# Patient Record
Sex: Male | Born: 1970 | Race: White | Hispanic: No | Marital: Single | Smoking: Current every day smoker
Health system: Southern US, Community
[De-identification: ages and names within clinical notes are randomized; demographics above are authoritative.]

## PROBLEM LIST (undated history)

## (undated) DIAGNOSIS — F329 Major depressive disorder, single episode, unspecified: Secondary | ICD-10-CM

## (undated) DIAGNOSIS — I219 Acute myocardial infarction, unspecified: Secondary | ICD-10-CM

## (undated) DIAGNOSIS — T7840XA Allergy, unspecified, initial encounter: Secondary | ICD-10-CM

## (undated) DIAGNOSIS — I10 Essential (primary) hypertension: Secondary | ICD-10-CM

## (undated) DIAGNOSIS — F419 Anxiety disorder, unspecified: Secondary | ICD-10-CM

## (undated) DIAGNOSIS — F32A Depression, unspecified: Secondary | ICD-10-CM

## (undated) DIAGNOSIS — K219 Gastro-esophageal reflux disease without esophagitis: Secondary | ICD-10-CM

## (undated) DIAGNOSIS — G4730 Sleep apnea, unspecified: Secondary | ICD-10-CM

## (undated) DIAGNOSIS — E119 Type 2 diabetes mellitus without complications: Secondary | ICD-10-CM

## (undated) DIAGNOSIS — M1990 Unspecified osteoarthritis, unspecified site: Secondary | ICD-10-CM

## (undated) DIAGNOSIS — G43909 Migraine, unspecified, not intractable, without status migrainosus: Secondary | ICD-10-CM

## (undated) DIAGNOSIS — E785 Hyperlipidemia, unspecified: Secondary | ICD-10-CM

## (undated) DIAGNOSIS — I639 Cerebral infarction, unspecified: Secondary | ICD-10-CM

## (undated) HISTORY — DX: Cerebral infarction, unspecified: I63.9

## (undated) HISTORY — DX: Depression, unspecified: F32.A

## (undated) HISTORY — DX: Essential (primary) hypertension: I10

## (undated) HISTORY — DX: Sleep apnea, unspecified: G47.30

## (undated) HISTORY — PX: CORONARY ANGIOPLASTY WITH STENT PLACEMENT: SHX49

## (undated) HISTORY — DX: Acute myocardial infarction, unspecified: I21.9

## (undated) HISTORY — DX: Type 2 diabetes mellitus without complications: E11.9

## (undated) HISTORY — DX: Unspecified osteoarthritis, unspecified site: M19.90

## (undated) HISTORY — DX: Hyperlipidemia, unspecified: E78.5

## (undated) HISTORY — PX: APPENDECTOMY: SHX54

## (undated) HISTORY — DX: Migraine, unspecified, not intractable, without status migrainosus: G43.909

## (undated) HISTORY — DX: Gastro-esophageal reflux disease without esophagitis: K21.9

## (undated) HISTORY — DX: Allergy, unspecified, initial encounter: T78.40XA

## (undated) HISTORY — PX: BACK SURGERY: SHX140

## (undated) HISTORY — DX: Anxiety disorder, unspecified: F41.9

## (undated) HISTORY — DX: Major depressive disorder, single episode, unspecified: F32.9

---

## 2003-03-12 ENCOUNTER — Observation Stay (HOSPITAL_COMMUNITY): Admission: RE | Admit: 2003-03-12 | Discharge: 2003-03-13 | Payer: Self-pay | Admitting: Specialist

## 2003-06-11 ENCOUNTER — Ambulatory Visit (HOSPITAL_COMMUNITY): Admission: RE | Admit: 2003-06-11 | Discharge: 2003-06-12 | Payer: Self-pay | Admitting: Specialist

## 2004-02-12 ENCOUNTER — Emergency Department: Payer: Self-pay | Admitting: Emergency Medicine

## 2004-02-27 ENCOUNTER — Emergency Department: Payer: Self-pay | Admitting: Internal Medicine

## 2006-05-19 ENCOUNTER — Emergency Department: Payer: Self-pay | Admitting: Emergency Medicine

## 2007-07-11 ENCOUNTER — Other Ambulatory Visit: Payer: Self-pay

## 2007-07-11 ENCOUNTER — Emergency Department: Payer: Self-pay | Admitting: Emergency Medicine

## 2007-11-24 ENCOUNTER — Other Ambulatory Visit: Payer: Self-pay

## 2007-11-24 ENCOUNTER — Emergency Department: Payer: Self-pay | Admitting: Emergency Medicine

## 2011-04-21 HISTORY — PX: CORONARY ARTERY BYPASS GRAFT: SHX141

## 2012-02-03 ENCOUNTER — Emergency Department: Payer: Self-pay | Admitting: Emergency Medicine

## 2012-02-03 LAB — CBC
HCT: 51.9 % (ref 40.0–52.0)
HGB: 17.9 g/dL (ref 13.0–18.0)
MCH: 30.1 pg (ref 26.0–34.0)
MCHC: 34.5 g/dL (ref 32.0–36.0)
MCV: 87 fL (ref 80–100)
Platelet: 262 10*3/uL (ref 150–440)
RBC: 5.94 10*6/uL — ABNORMAL HIGH (ref 4.40–5.90)
RDW: 13 % (ref 11.5–14.5)
WBC: 16.7 10*3/uL — ABNORMAL HIGH (ref 3.8–10.6)

## 2012-02-03 LAB — CK TOTAL AND CKMB (NOT AT ARMC)
CK, Total: 178 U/L (ref 35–232)
CK-MB: 0.8 ng/mL (ref 0.5–3.6)

## 2012-02-03 LAB — COMPREHENSIVE METABOLIC PANEL
Albumin: 4 g/dL (ref 3.4–5.0)
Alkaline Phosphatase: 86 U/L (ref 50–136)
Anion Gap: 12 (ref 7–16)
BUN: 9 mg/dL (ref 7–18)
Bilirubin,Total: 0.2 mg/dL (ref 0.2–1.0)
Calcium, Total: 9.2 mg/dL (ref 8.5–10.1)
Chloride: 106 mmol/L (ref 98–107)
Co2: 22 mmol/L (ref 21–32)
Creatinine: 1.2 mg/dL (ref 0.60–1.30)
EGFR (African American): >60
EGFR (Non-African Amer.): >60
Glucose: 151 mg/dL — ABNORMAL HIGH (ref 65–99)
Osmolality: 281 (ref 275–301)
Potassium: 3.5 mmol/L (ref 3.5–5.1)
SGOT(AST): 35 U/L (ref 15–37)
SGPT (ALT): 35 U/L (ref 12–78)
Sodium: 140 mmol/L (ref 136–145)
Total Protein: 7.6 g/dL (ref 6.4–8.2)

## 2012-02-03 LAB — TROPONIN I: Troponin-I: <0.02 ng/mL

## 2012-02-05 NOTE — Discharge Summary (Signed)
 [facility] [address], [place] , [place] [address]   [phone]  Discharge Summary  Calvin Ortiz  [id number]  [birth year]   Admission Date: 02/03/2012  Discharge Date: 02/05/2012  Primary Diagnosis:  STEMI (ST elevation myocardial infarction)  Secondary Diagnoses: Patient Active Problem List   Diagnosis Date Noted  . STEMI (ST elevation myocardial infarction) 02/03/2012  . Hypertension 02/03/2012   Hospital Course:  [name] is a 41 y.o. male with a past medical history of back pain secondary to 2 ruptured herniated discs who presented with CP to OSH. The patient was laying in bed watching a horror movie around 2am when experienced left arm pain followed by sharp left CP. The patient then experienced SOB, diaphoresis, and nausea. Upon receiving nitro, the patient's CP was relieved. He denies any CP like this before though did have brief palpitations while climbing stairs to take his dogs out last week which spontaneously resolved. He denies leg edema, orthopnea, PND, DOE, syncope or lightheadedness. On arrival at the OSH, the patient was noted to have SBP in 200s, HR in 120s and ST elevations in V2-V4 and ST depression in 1 & V1. The patient received IV metoprolol and was started on heparin , 324 ASA, and 600 plavix  prior to transport.  He subsequently received 3 BMS to the RCA in the cath lab on arrival. The patient currently smokes 1ppd for 16 years. Denies any personal or family history of CAD though both parents have HTN. Unemployed and pays for medications out of pocket. He did well post procedure.  Was up walking without difficulty or reproducing symptoms.  He was therefore cleared for discharge home.  Discharged to home with medication changes and follow up as noted below.  As he has no insurance was placed medications from [employer] list to try to help ensure compliance, really stressed the importance of compliance with his aspirin  and plavix .   He will follow with  [facility] as he has no current cardiologist near his current address.  Procedures during hospitalization:  Cardiac catheterization Via 6 French right femoral arterial access:  Left main coronary artery: Normal.  Left circumflex: Normal branching ramus. Normal OM1, small OM2, small OM3. There are epicardial AV groove collaterals from the distal LCX that supply the PL system.  Left anterior descending: Normal D1, small D2. There is a 20% irregularity in the proximal LAD.  Right coronary artery: Chronic subtotal occlusion at the level of the mid-RCA and extending to the acute angle, spanning the acute marginal branch.  Attention directed to the RCA lesion.  Via 6 French right femoral arterial access, bivalirudin blockade:  6 [place] JR4 guide  0.014" Pilot 50 wire  1.5 x 15 mm Sprinter balloon to 10 atm.  2.0 x 20 mm Sprinter balloon to 12 atm.  2.25 x 8 mm Integrity bare metal stent to 12 atm.  2.5 x 26 mm Integrity bare metal stent to 16 atm.  3.0 x 12 mm Integrity bare metal stent to 18 atm.  3.0 x 12 mm delivery system used to post-dilate the mid-RCA stenosis. Left ventricular pressures: LVEDP ~ 20 mmHg.  Impression: 99.9% mid-RCA  10% residual. No apparent complications. I was present for the entire procedure. Patient to ward in satisfactory condition.  Plan: Aspirin  162 mg PO daily indefinitely; clopidogrel  75 mg PO daily for at least 28 days, preferably 12 months; bivalirudin infusion to run at 0.25 mg/kg/hour for 2 hours. Institute antihypertensive therapy titrated to systolic pressure of 160 mmHg for  next 24 hours; plan sheath removal in 4 hours. Echo NORMAL LEFT VENTRICULAR FUNCTION WITH MODERATE LVH NORMAL RIGHT VENTRICULAR SYSTOLIC FUNCTION NO VALVULAR REGURGITATION NO VALVULAR STENOSIS  Physical Exam at discharge:  General: normal apperance Heent: PERRL.  MMM.   Neck: No carotid bruits.  JVP flat.   Lungs: Clear to auscultation bilaterally, without rales, rhonchi or  wheezing CV: RRR, normal S1, S2 and no murmur, rub, gallop or click Abdomen: nontender, nondistended, normal bowel sounds in all four quadrents Skin: Normal and no rashes Neuro: Alert and oriented.  Moves all extremities well.   Extremities: warm and well-perfused, without clubbing, cyanosis or edema  Pertinent laboratory data:  Labs: Recent Labs     02/03/12  0708  02/04/12  0138  02/05/12  0634  NA  133*  134*  138  K  4.3  4.2  3.8  CL  107  108  108  CO2  21  20*  20*  BUN  8  6*  11  CREATININE  0.9  0.8  0.9  MG  2.1  1.8   --    Recent Labs     02/03/12  0708  02/04/12  0138  02/05/12  0634  WBC  10.9*  14.5*  16.0*  HGB  15.7  16.0  16.6  HCT  0.44  0.44  0.48  PLT  239  200  257   Recent Labs     02/03/12  0651  02/04/12  0138  INR  1.7*  0.9  APTT  107.4*  31.3   Discharge Vital Signs: BP 138/71  Pulse 70  Temp(Src) 38 C (100.4 F) (Oral)  Resp 18  Ht 163 cm (5' 4.17)  Wt 92.4 kg (203 lb 11.3 oz)  BMI 34.78 kg/m2  SpO2 97%  Discharge Plan and Instructions:  Appointments: Future Appointments Date Time Provider Department Center  02/18/2012 10:00 AM Mliss Cleotilde Na, PA [facility] [place]   Discharge Medications:  Current Discharge Medication List    START taking these medications   Details  aspirin  81 MG EC tablet Take 1 tablet (81 mg total) by mouth daily. Qty: 30 tablet, Refills: 11    clopidogrel  (PLAVIX ) 75 mg tablet Take 1 tablet (75 mg total) by mouth daily. Qty: 30 tablet, Refills: 11    hydrALAZINE (APRESOLINE) 50 MG tablet Take 1 tablet (50 mg total) by mouth every 8 (eight) hours. Qty: 90 tablet, Refills: 11    isosorbide  mononitrate (IMDUR ) 120 MG ER tablet Take 1 tablet (120 mg total) by mouth daily. Qty: 30 tablet, Refills: 11    lisinopril  (PRINIVIL ,ZESTRIL ) 40 MG tablet Take 1 tablet (40 mg total) by mouth daily. Qty: 30 tablet, Refills: 11    metoprolol tartrate (LOPRESSOR) 100 MG tablet Take 1 tablet (100  mg total) by mouth 2 (two) times daily. Qty: 60 tablet, Refills: 11    simvastatin (ZOCOR) 40 MG tablet Take 1 tablet (40 mg total) by mouth nightly. Qty: 30 tablet, Refills: 11       02/05/2012 Marney Charlie Pizza, NP  I personally performed or jointly provided the services with an auxiliary provider (NP, PA, etc.) but there was no involvement of a resident or fellow.      Lynwood Donnice Crouch, MD

## 2012-02-07 DIAGNOSIS — I10 Essential (primary) hypertension: Secondary | ICD-10-CM | POA: Insufficient documentation

## 2012-02-17 DIAGNOSIS — E1169 Type 2 diabetes mellitus with other specified complication: Secondary | ICD-10-CM | POA: Insufficient documentation

## 2012-02-18 NOTE — Progress Notes (Signed)
 Calvin Ortiz, Fl[place] 02/18/2012 DOB: 06/30/1970  Age: 41 y.o. Clinic Note - Cardiovascular Calvin Na, PA-C  PRIMARY CARE PROVIDER: NONE PCP None [place] None None  PRIMARY CARDIOLOGIST:  Not yet established  PATIENT PROFILE:  Calvin Ortiz  is a 41 y.o.  male who returns for hospital follow up of  STEMI  PROBLEM LIST:    Problem List Date Reviewed: 2012-03-01       Codes Priority Class Noted - Resolved   Hyperlipidemia LDL goal < 70 272.4   03/01/2012 - Present   Overview   Signed March 01, 2012 11:39 AM by Calvin Cleotilde Na, PA     A. 01/2012: TC 174, TG 132, HDL 31, LDL 117    Tobacco use 305.1   03-01-2012 - Present   Overview   Signed 02/18/2012 11:50 AM by Calvin Cleotilde Na, PA     1ppd for 16 years    Hypertension 401.9   02/07/2012 - Present   CAD S/P percutaneous coronary angioplasty 414.01, V45.82   02/03/2012 - Present   Overview   Addendum 03/01/2012 11:38 AM by Calvin Cleotilde Na, PA     A. 01/2012 STEMI. subtoltal occlusion RCA s/p BMS x3. LAD 20%. Normal LCX.  B. 01/2012 echo. EF >55%. Mod LVH.        MEDICATIONS: Current Outpatient Prescriptions  Medication Sig Dispense Refill  . aspirin  81 MG EC tablet Take 1 tablet (81 mg total) by mouth daily.  30 tablet  11  . clopidogrel  (PLAVIX ) 75 mg tablet Take 1 tablet (75 mg total) by mouth daily.  30 tablet  11  . hydrALAZINE (APRESOLINE) 50 MG tablet Take 1 tablet (50 mg total) by mouth every 8 (eight) hours.  90 tablet  11  . isosorbide  mononitrate (IMDUR ) 120 MG ER tablet Take 1 tablet (120 mg total) by mouth daily.  30 tablet  11  . lisinopril  (PRINIVIL ,ZESTRIL ) 40 MG tablet Take 1 tablet (40 mg total) by mouth daily.  30 tablet  11  . simvastatin (ZOCOR) 40 MG tablet Take 1 tablet (40 mg total) by mouth nightly.  30 tablet  11  . DISCONTD: metoprolol tartrate (LOPRESSOR) 100 MG tablet Take 1 tablet (100 mg total) by mouth 2 (two) times daily.  60 tablet  11  . carvedilol  (COREG ) 25 MG tablet  Take 1 tablet (25 mg total) by mouth 2 (two) times daily with meals.  60 tablet  11   No current facility-administered medications for this visit.    ALLERGIES: Allergies  Allergen Reactions  . Morphine Unknown  . Sulfa (Sulfonamide Antibiotics) Rash    SOCIAL HISTORY:  reports that he quit smoking today. He has never used smokeless tobacco. He reports that he does not drink alcohol or use illicit drugs. unemployed d/t back surgeries and inability to work. No health insurance.  FAMILY HISTORY: family history includes Hypertension in his father and mother.  Subjective:  Calvin Ortiz  is a 41 y.o.  male with h/o tobacco use and back surgeries who recently had STEMI and PCI comes in for hospital f/u.   Hospitalized 10/16-10/18/13 for STEMI.  He presented with CP to OSH. The patient was laying in bed watching a horror movie around 2am when experienced left arm pain followed by sharp left CP, SOB, diaphoresis, and nausea relieved with nitro. To OSH, SBP in 200s, HR in 120s and ST elevations in V2-V4. RCA with subtotal occlusion s/p 3 BMS.  BP high, started on multi drug regimen. EF normal by echo. Unemployed  and pays for medications out of pocket. Interval hx No CP, dizziness, syncope, orthopnea, edema, palpitations. Few seconds of SOB in late afternoon hours.  Taking DUAP therapy every day w/o difficulty. Remained off tobacco since discharge. Getting meds OK. Mild h/a from nitrate. Hard time remembering tid hydralazine but still doing it. BP 110-120s at home. No PCP.    Review of Systems:  General. No fever, chills or recent illness, no change in weight, appetite changes. +fatigue HEENT. No change in vision or hearing, no C/O pain or difficulty swallowing, neck stiffness, voice changes Respiratory. No cough or , sleep apnea, chest tightness, high pitched wheeze or stridor. +shortness of breath Cardiovascular. No chest pain or palpitations, PND, orthopnea  GI. No pain, or change in  urinary or bowel habits.+ dyspepsia GU. No dysuria, frequency or hesitancy, decreased amount of urine Musculoskeletal. No joint pain or injury, problems walking, muscle aching Neurological. No headaches, changes in mental status, no loss of sensation, strength or function, dizziness, lightheadedness, passing out, tremors, weakness Heme No easy bruising or bleeding Integument No color change, rash Psych Not easily agitated, confused, depressed, anxious   Objective:  BP 128/76  Pulse 58  Temp(Src) 36.6 C (97.9 F)  Resp 18  Ht 165.1 cm (5' 5)  Wt 97.705 kg (215 lb 6.4 oz)  BMI 35.84 kg/m2  SpO2 96% General Well appearing; no acute distress.  Skin Warm and dry, without rashes or excessive bruising. HEENT Sclera and conjunctiva clear; normocephalic, atraumatic, face is symmetric at rest.   Neck Supple; no masses; no jugular venous distension or carotid bruits; no significant cervical or supraclavicular lymphadenopathy; no thyromegaly Lungs Respiration unlabored; clear to auscultation bilaterally. Cardio Regular rate and rhythm without murmurs, rubs, or gallops. Normal S1, S2. Abdomen Soft; non-tender; not distended; normal, active bowel sounds; no masses or organomegaly, no HJR or pulsatile aorta. Right groin site w/o hematoma or bruit Musculoskeletal No deformity; appropriate muscle tone; no active joint inflammation Extremities No edema, cyanosis, clubbing or varicosities; peripheral pulses 2+ symmetric Neurologic Alert and oriented; speech intact; face symmetric; moves all extremities well, gait normal  Tests:  Pending   Assessment and Plan:   CAD S/P percutaneous coronary angioplasty and STEMI. RCA revascularized. No residual disease. Compliant with DUAP therapy. On aspirin , plavix , BB, statin, ACE. Will see him in 1 year. Cardiac rehab if he obtains insurance. Suggested new gov't plans.  Hypertension. BP now well controlled on multi drug regimen. Needs low cost options. Feel  regimen could be streamlined but will take multiple appts and he is unemployed and without insurance. Switch metop to carvedilol  25mg  bid when he finishes current supply. Decrease Imdur  to 60mg  daily with hopes of eventually discontinuing. He can call me if BP too high or too low. Goal <130/80 after MI. Then, in future would try to get him off hydral/nitrate combo and try amlodipine  of Bp still requires it. Check potassium after starting max dose ACE. Highly encouraged him to obtain local PCP to help manage HTN which would be lower cost option than numerous visit to cards clinic. Check out needymeds.org and find sliding scale clinic in [place] to help - Potassium  Hyperlipidemia LDL goal < 70. LDL 117, goal is <70 after MI. On simva at theoretical max dose for cost purposes. Repeat next year.  Tobacco use. No cigarette smoking since discharge. Congratulated on efforts and recommended to continue.   Other Orders - carvedilol  (COREG ) 25 MG tablet; Take 1 tablet (25 mg total) by mouth  2 (two) times daily with meals.  Disposition:  RTC in 1 year.  Side effects, risks and benefits of medications were explained. The patient or responsible adult showed the ability to learn, asked appropriate questions. There were no barriers to learning and they verbalized understanding of the treatment plan.  After Visit Summary provided to patient and/or family member.  I personally performed the service.  The service was performed non-incident to a physician and/or without a teaching provider being immediately available.

## 2012-06-23 ENCOUNTER — Emergency Department: Payer: Self-pay | Admitting: Emergency Medicine

## 2013-10-26 LAB — TSH: TSH: 1.47 u[IU]/mL (ref <=5.90)

## 2013-11-16 DIAGNOSIS — E7800 Pure hypercholesterolemia, unspecified: Secondary | ICD-10-CM | POA: Insufficient documentation

## 2014-09-27 ENCOUNTER — Ambulatory Visit: Payer: Self-pay

## 2014-10-18 ENCOUNTER — Ambulatory Visit: Payer: Self-pay | Admitting: Ophthalmology

## 2014-10-18 ENCOUNTER — Other Ambulatory Visit: Payer: Self-pay

## 2014-10-18 LAB — HEPATIC FUNCTION PANEL
ALT: 32 U/L (ref 10–40)
AST: 19 U/L (ref 14–40)
Alkaline Phosphatase: 56 U/L (ref 25–125)
Bilirubin, Direct: 0.07 mg/dL (ref 0.01–0.4)
Bilirubin, Total: 0.3 mg/dL

## 2014-10-18 LAB — BASIC METABOLIC PANEL
BUN: 8 mg/dL (ref 4–21)
Creatinine: 0.8 mg/dL (ref 0.6–1.3)
Glucose: 108 mg/dL
Potassium: 4.7 mmol/L (ref 3.4–5.3)
Sodium: 138 mmol/L (ref 137–147)

## 2014-10-18 LAB — LIPID PANEL
Cholesterol: 143 mg/dL (ref 0–200)
HDL: 28 mg/dL — AB (ref 35–70)
LDL Cholesterol: 73 mg/dL
Triglycerides: 212 mg/dL — AB (ref 40–160)

## 2014-10-18 LAB — CBC AND DIFFERENTIAL
HCT: 45 % (ref 41–53)
Hemoglobin: 15.8 g/dL (ref 13.5–17.5)
Neutrophils Absolute: 5 /uL
Platelets: 235 10*3/uL (ref 150–399)
WBC: 8.9 10^3/mL

## 2014-10-18 LAB — HEMOGLOBIN A1C: Hemoglobin A1C: 5.9

## 2014-10-24 ENCOUNTER — Other Ambulatory Visit: Payer: Self-pay | Admitting: General Practice

## 2014-10-24 ENCOUNTER — Ambulatory Visit
Admission: RE | Admit: 2014-10-24 | Discharge: 2014-10-24 | Disposition: A | Discharge status: Home / Self Care | Payer: Disability Insurance | Source: Ambulatory Visit | Attending: General Practice | Admitting: General Practice

## 2014-10-24 DIAGNOSIS — M4800 Spinal stenosis, site unspecified: Secondary | ICD-10-CM

## 2014-10-24 DIAGNOSIS — I159 Secondary hypertension, unspecified: Secondary | ICD-10-CM

## 2014-10-24 DIAGNOSIS — M545 Low back pain: Secondary | ICD-10-CM | POA: Diagnosis present

## 2014-10-24 DIAGNOSIS — M47816 Spondylosis without myelopathy or radiculopathy, lumbar region: Secondary | ICD-10-CM | POA: Insufficient documentation

## 2014-10-30 ENCOUNTER — Ambulatory Visit: Payer: Self-pay

## 2014-10-30 DIAGNOSIS — I10 Essential (primary) hypertension: Secondary | ICD-10-CM | POA: Insufficient documentation

## 2015-01-31 ENCOUNTER — Ambulatory Visit: Payer: Self-pay

## 2015-03-12 DIAGNOSIS — I2510 Atherosclerotic heart disease of native coronary artery without angina pectoris: Secondary | ICD-10-CM | POA: Insufficient documentation

## 2015-07-04 ENCOUNTER — Telehealth: Payer: Self-pay

## 2015-07-04 NOTE — Telephone Encounter (Signed)
Called mr let him know we are unable to authorize refills due to [facility], let mr know he needs to complete eligibility and then schedule an appointment.

## 2015-07-04 NOTE — Telephone Encounter (Signed)
Received fa from [facility] to refill medications , un able to authorize refills pat has not been seen in over a year. Sent fax back to [facility] denying refills

## 2015-07-17 DIAGNOSIS — E7800 Pure hypercholesterolemia, unspecified: Secondary | ICD-10-CM

## 2015-07-17 DIAGNOSIS — I10 Essential (primary) hypertension: Secondary | ICD-10-CM

## 2015-07-18 ENCOUNTER — Ambulatory Visit: Payer: Self-pay

## 2015-08-13 ENCOUNTER — Other Ambulatory Visit: Payer: Self-pay

## 2015-08-20 ENCOUNTER — Ambulatory Visit: Payer: Self-pay

## 2015-08-27 ENCOUNTER — Telehealth: Payer: Self-pay

## 2015-08-27 ENCOUNTER — Other Ambulatory Visit: Payer: Self-pay

## 2015-08-27 NOTE — Telephone Encounter (Signed)
Tried to return patients phone call to reschedule appointment and the phone was disconnected.

## 2015-09-03 ENCOUNTER — Other Ambulatory Visit: Payer: Self-pay

## 2015-09-05 ENCOUNTER — Other Ambulatory Visit: Payer: Self-pay

## 2015-09-05 DIAGNOSIS — E7800 Pure hypercholesterolemia, unspecified: Secondary | ICD-10-CM

## 2015-09-05 DIAGNOSIS — I10 Essential (primary) hypertension: Secondary | ICD-10-CM

## 2015-09-06 LAB — CBC WITH DIFFERENTIAL/PLATELET
BASOS ABS: 0.1 10*3/uL (ref 0.0–0.2)
BASOS: 1 %
EOS (ABSOLUTE): 0.3 10*3/uL (ref 0.0–0.4)
Eos: 3 %
HEMATOCRIT: 45.9 % (ref 37.5–51.0)
HEMOGLOBIN: 15.8 g/dL (ref 12.6–17.7)
Immature Grans (Abs): 0.1 10*3/uL (ref 0.0–0.1)
Immature Granulocytes: 1 %
LYMPHS ABS: 3.3 10*3/uL — AB (ref 0.7–3.1)
Lymphs: 35 %
MCH: 29.4 pg (ref 26.6–33.0)
MCHC: 34.4 g/dL (ref 31.5–35.7)
MCV: 85 fL (ref 79–97)
MONOCYTES: 5 %
Monocytes Absolute: 0.4 10*3/uL (ref 0.1–0.9)
NEUTROS ABS: 5.2 10*3/uL (ref 1.4–7.0)
Neutrophils: 55 %
Platelets: 276 10*3/uL (ref 150–379)
RBC: 5.38 x10E6/uL (ref 4.14–5.80)
RDW: 13.7 % (ref 12.3–15.4)
WBC: 9.3 10*3/uL (ref 3.4–10.8)

## 2015-09-06 LAB — COMPREHENSIVE METABOLIC PANEL
A/G RATIO: 1.8 (ref 1.2–2.2)
ALBUMIN: 4.2 g/dL (ref 3.5–5.5)
ALT: 28 IU/L (ref 0–44)
AST: 22 IU/L (ref 0–40)
Alkaline Phosphatase: 56 IU/L (ref 39–117)
BILIRUBIN TOTAL: 0.3 mg/dL (ref 0.0–1.2)
BUN / CREAT RATIO: 10 (ref 9–20)
BUN: 11 mg/dL (ref 6–24)
CHLORIDE: 101 mmol/L (ref 96–106)
CO2: 19 mmol/L (ref 18–29)
Calcium: 9.7 mg/dL (ref 8.7–10.2)
Creatinine, Ser: 1.14 mg/dL (ref 0.76–1.27)
GFR calc non Af Amer: 78 mL/min/{1.73_m2} (ref >59)
GFR, EST AFRICAN AMERICAN: 91 mL/min/{1.73_m2} (ref >59)
Globulin, Total: 2.3 g/dL (ref 1.5–4.5)
Glucose: 113 mg/dL — ABNORMAL HIGH (ref 65–99)
POTASSIUM: 4.4 mmol/L (ref 3.5–5.2)
Sodium: 139 mmol/L (ref 134–144)
TOTAL PROTEIN: 6.5 g/dL (ref 6.0–8.5)

## 2015-09-06 LAB — LIPID PANEL
Chol/HDL Ratio: 6.8 ratio units — ABNORMAL HIGH (ref 0.0–5.0)
Cholesterol, Total: 149 mg/dL (ref 100–199)
HDL: 22 mg/dL — ABNORMAL LOW (ref >39)
LDL Calculated: 57 mg/dL (ref 0–99)
Triglycerides: 351 mg/dL — ABNORMAL HIGH (ref 0–149)
VLDL Cholesterol Cal: 70 mg/dL — ABNORMAL HIGH (ref 5–40)

## 2015-09-10 ENCOUNTER — Ambulatory Visit: Payer: Self-pay | Admitting: Nurse Practitioner

## 2015-09-10 VITALS — BP 133/76 | Temp 98.6°F | Wt 235.0 lb

## 2015-09-10 DIAGNOSIS — K089 Disorder of teeth and supporting structures, unspecified: Secondary | ICD-10-CM

## 2015-09-10 DIAGNOSIS — E782 Mixed hyperlipidemia: Secondary | ICD-10-CM

## 2015-09-10 DIAGNOSIS — IMO0001 Reserved for inherently not codable concepts without codable children: Secondary | ICD-10-CM

## 2015-09-10 DIAGNOSIS — I10 Essential (primary) hypertension: Secondary | ICD-10-CM

## 2015-09-10 MED ORDER — FEXOFENADINE HCL 180 MG PO TABS: 180.0000 mg | ORAL_TABLET | Freq: Every day | ORAL | Status: DC

## 2015-09-10 MED ORDER — ATORVASTATIN CALCIUM 10 MG PO TABS: 10.0000 mg | ORAL_TABLET | Freq: Every day | ORAL | Status: DC

## 2015-09-10 NOTE — Progress Notes (Signed)
   Subjective:    Patient ID: Calvin Ortiz, male    DOB: 02/15/1971, 45 y.o.   MRN: [id number]  HPI Has not smoked x 3 months.    Triglycerides now at 315, pt has not been taking lipitor, on fenofibrate at 145 mg per day  States ot eating fried foods, but does eat a lot of butter. In particular eats a lots of potatoes,   Eats sandwich for lunch  For supper, grilled chicken, beef stew, spaghetti, cooks for father and disabled mother  Pain L lateral chest, during night and intermittently during day, describes as stabbing, relieved with elevation of L arm, with occsional shortness of breath, no hsitory of dvt, no unsual swelling    C/o allergic rhinitis  Review of Systems     Objective:   Physical Exam  alert verbally appropriate in NAD, carotid bruits present  Multiple broken teeth, no gross abscesses,   No thyromegaly or adenopathy  No ble edema        Assessment & Plan:   Will refer for dental care, asap, no current abscesses, but extensive gingivitis.    Hyperlipidemia with elevation of triglycerides to 315, will restart lipitor at 10 mg each pm and recheck liver panel in one month  Creatinine at 1.14, will recheck renal panel in 3 months  Will also implement fexofenadine at 180 mg per day for his allergic rhinitis.  Will have pt rtc in three months

## 2015-10-08 ENCOUNTER — Other Ambulatory Visit: Payer: Self-pay

## 2015-10-08 DIAGNOSIS — E785 Hyperlipidemia, unspecified: Secondary | ICD-10-CM

## 2015-10-08 DIAGNOSIS — J302 Other seasonal allergic rhinitis: Secondary | ICD-10-CM

## 2015-10-08 MED ORDER — FEXOFENADINE HCL 180 MG PO TABS: 180.0000 mg | ORAL_TABLET | Freq: Every day | ORAL | Status: DC

## 2015-10-08 MED ORDER — ATORVASTATIN CALCIUM 10 MG PO TABS: 10.0000 mg | ORAL_TABLET | Freq: Every day | ORAL | Status: DC

## 2015-10-15 ENCOUNTER — Ambulatory Visit: Payer: Self-pay

## 2015-10-31 ENCOUNTER — Ambulatory Visit: Payer: Self-pay | Admitting: Ophthalmology

## 2015-11-12 ENCOUNTER — Ambulatory Visit: Payer: Self-pay | Admitting: Nurse Practitioner

## 2015-11-12 VITALS — BP 146/85 | HR 72 | Wt 236.5 lb

## 2015-11-12 DIAGNOSIS — J302 Other seasonal allergic rhinitis: Secondary | ICD-10-CM

## 2015-11-12 DIAGNOSIS — E119 Type 2 diabetes mellitus without complications: Secondary | ICD-10-CM

## 2015-11-12 LAB — GLUCOSE, POCT (MANUAL RESULT ENTRY): POC Glucose: 148 mg/dl — AB (ref 70–99)

## 2015-11-12 MED ORDER — FLUTICASONE PROPIONATE 50 MCG/ACT NA SUSP: 2.0000 | Freq: Every day | NASAL | Status: DC

## 2015-11-12 MED ORDER — AMLODIPINE BESYLATE 10 MG PO TABS: 10.0000 mg | ORAL_TABLET | Freq: Every day | ORAL | 1 refills | Status: DC

## 2015-11-12 NOTE — Progress Notes (Signed)
  Here today, did not have may labs drawn.  States bp has been running a little high, ongoing back pain, s/p two spinal surgeries.    States checks bp three times a week, lowest 132/67, and otherwise in the 140's  Otherwise doing ok, intermittent heart "flutters", but not a new problem, on coreg.    cbg tonight at 146, but ate between 5: 30 and 6p.m.  Minimal use of salt.    Quit smoking 6 months ago  Alert verbally appropriate, in no acute distress  No thyromegaly, no adenopathy, carotid bruit not present.    AP rrr, BBrS clear. No BLE edema  Plan:  Pt is to have his ordered labs drawn asap Will increase amlodipine to 10 mg per day, will check response in one month and review labs.

## 2015-11-12 NOTE — Addendum Note (Signed)
Addended by: Zachery Dauer on: 11/12/2015 08:34 PM   Modules accepted: Orders

## 2015-11-18 ENCOUNTER — Other Ambulatory Visit: Payer: Self-pay

## 2015-11-18 DIAGNOSIS — I10 Essential (primary) hypertension: Secondary | ICD-10-CM

## 2015-11-18 MED ORDER — HYDROCHLOROTHIAZIDE 25 MG PO TABS: 25.0000 mg | ORAL_TABLET | Freq: Every day | ORAL | 2 refills | Status: DC

## 2015-11-21 ENCOUNTER — Other Ambulatory Visit: Payer: Self-pay

## 2015-11-21 DIAGNOSIS — J302 Other seasonal allergic rhinitis: Secondary | ICD-10-CM

## 2015-11-21 DIAGNOSIS — E785 Hyperlipidemia, unspecified: Secondary | ICD-10-CM

## 2015-11-21 DIAGNOSIS — I10 Essential (primary) hypertension: Secondary | ICD-10-CM

## 2015-11-21 MED ORDER — AMLODIPINE BESYLATE 10 MG PO TABS: 10.0000 mg | ORAL_TABLET | Freq: Every day | ORAL | 1 refills | Status: DC

## 2015-11-21 MED ORDER — ATORVASTATIN CALCIUM 10 MG PO TABS: 10.0000 mg | ORAL_TABLET | Freq: Every day | ORAL | 2 refills | Status: DC

## 2015-11-21 MED ORDER — FEXOFENADINE HCL 180 MG PO TABS: 180.0000 mg | ORAL_TABLET | Freq: Every day | ORAL | 2 refills | Status: DC

## 2015-11-21 MED ORDER — HYDROCHLOROTHIAZIDE 25 MG PO TABS: 25.0000 mg | ORAL_TABLET | Freq: Every day | ORAL | 2 refills | Status: DC

## 2015-11-26 ENCOUNTER — Other Ambulatory Visit: Payer: Self-pay

## 2015-11-26 DIAGNOSIS — IMO0001 Reserved for inherently not codable concepts without codable children: Secondary | ICD-10-CM

## 2015-11-26 DIAGNOSIS — E782 Mixed hyperlipidemia: Secondary | ICD-10-CM

## 2015-11-27 LAB — HEPATIC FUNCTION PANEL
ALBUMIN: 4.2 g/dL (ref 3.5–5.5)
ALK PHOS: 61 IU/L (ref 39–117)
ALT: 32 IU/L (ref 0–44)
AST: 21 IU/L (ref 0–40)
BILIRUBIN TOTAL: 0.2 mg/dL (ref 0.0–1.2)
Bilirubin, Direct: 0.11 mg/dL (ref 0.00–0.40)
TOTAL PROTEIN: 6.6 g/dL (ref 6.0–8.5)

## 2015-11-27 LAB — URINALYSIS, ROUTINE W REFLEX MICROSCOPIC
BILIRUBIN UA: NEGATIVE
Glucose, UA: NEGATIVE
Ketones, UA: NEGATIVE
Leukocytes, UA: NEGATIVE
Nitrite, UA: NEGATIVE
PH UA: 5 (ref 5.0–7.5)
PROTEIN UA: NEGATIVE
RBC UA: NEGATIVE
Specific Gravity, UA: 1.027 (ref 1.005–1.030)
UUROB: 0.2 mg/dL (ref 0.2–1.0)

## 2015-12-06 ENCOUNTER — Other Ambulatory Visit: Payer: Self-pay | Admitting: Urology

## 2015-12-17 ENCOUNTER — Ambulatory Visit: Payer: Self-pay

## 2015-12-19 ENCOUNTER — Ambulatory Visit: Payer: Self-pay | Admitting: Nurse Practitioner

## 2015-12-19 VITALS — BP 128/76 | HR 62 | Ht 64.0 in | Wt 233.0 lb

## 2015-12-19 DIAGNOSIS — G8929 Other chronic pain: Secondary | ICD-10-CM

## 2015-12-19 DIAGNOSIS — R7309 Other abnormal glucose: Secondary | ICD-10-CM

## 2015-12-19 DIAGNOSIS — I10 Essential (primary) hypertension: Secondary | ICD-10-CM

## 2015-12-19 DIAGNOSIS — M25551 Pain in right hip: Principal | ICD-10-CM

## 2015-12-19 DIAGNOSIS — E782 Mixed hyperlipidemia: Secondary | ICD-10-CM

## 2015-12-19 DIAGNOSIS — R5383 Other fatigue: Secondary | ICD-10-CM

## 2015-12-19 MED ORDER — FLUTICASONE PROPIONATE 50 MCG/ACT NA SUSP: 2.0000 | Freq: Every day | NASAL | Status: DC

## 2015-12-19 NOTE — Progress Notes (Signed)
HPI:  Stopped smoking 12 days ago.    Lumbar spine surgeries in 2007 and 2008, now with radiating R hip pain.    States hip is worse than lower back and can not sleep on R side  No other verbalized concerns    Exam:   Alert, verbally appropriate, in NAD No thyromegaly or adenopathy, no carotid bruits  AP RRR BBrS clear BLE with max of trace edema, + PT pulses x 2.    Plan:    Hip pain, must consider djd, will obtain a film of the R hip for eval.   Will have labs drawn in 3 months and follow up after labs

## 2015-12-26 ENCOUNTER — Other Ambulatory Visit: Payer: Self-pay

## 2015-12-26 DIAGNOSIS — J309 Allergic rhinitis, unspecified: Secondary | ICD-10-CM

## 2016-01-09 ENCOUNTER — Other Ambulatory Visit: Payer: Self-pay | Admitting: Urology

## 2016-01-09 ENCOUNTER — Telehealth: Payer: Self-pay

## 2016-01-09 DIAGNOSIS — M25551 Pain in right hip: Secondary | ICD-10-CM

## 2016-01-09 NOTE — Telephone Encounter (Signed)
Called pt and discussed that a new referral will be placed tonight for his hip. Explained to pt to wait until next week to make sure the order is signed and ready for him to go. Pt verbalized understanding.

## 2016-01-10 MED ORDER — FLUTICASONE PROPIONATE 50 MCG/ACT NA SUSP: 2.0000 | Freq: Every day | NASAL | 11 refills | Status: DC

## 2016-01-16 ENCOUNTER — Ambulatory Visit
Admission: RE | Admit: 2016-01-16 | Discharge: 2016-01-16 | Disposition: A | Discharge status: Home / Self Care | Payer: Self-pay | Source: Ambulatory Visit | Attending: Urology | Admitting: Urology

## 2016-01-16 ENCOUNTER — Other Ambulatory Visit: Payer: Self-pay | Admitting: Urology

## 2016-01-16 DIAGNOSIS — M25551 Pain in right hip: Secondary | ICD-10-CM

## 2016-02-26 ENCOUNTER — Other Ambulatory Visit: Payer: Self-pay | Admitting: Nurse Practitioner

## 2016-02-26 DIAGNOSIS — I10 Essential (primary) hypertension: Secondary | ICD-10-CM

## 2016-02-26 DIAGNOSIS — J302 Other seasonal allergic rhinitis: Secondary | ICD-10-CM

## 2016-02-27 ENCOUNTER — Other Ambulatory Visit: Payer: Self-pay

## 2016-02-27 DIAGNOSIS — J302 Other seasonal allergic rhinitis: Secondary | ICD-10-CM

## 2016-02-27 MED ORDER — FEXOFENADINE HCL 180 MG PO TABS: 180.0000 mg | ORAL_TABLET | Freq: Every day | ORAL | 3 refills | Status: DC

## 2016-02-27 NOTE — Telephone Encounter (Signed)
Received [facility] from [facility] for refill on fexofenadine hcl 180mg .

## 2016-04-02 ENCOUNTER — Encounter (INDEPENDENT_AMBULATORY_CARE_PROVIDER_SITE_OTHER): Payer: Self-pay

## 2016-04-02 ENCOUNTER — Other Ambulatory Visit: Payer: Self-pay

## 2016-04-02 DIAGNOSIS — R7309 Other abnormal glucose: Secondary | ICD-10-CM

## 2016-04-02 DIAGNOSIS — R5383 Other fatigue: Secondary | ICD-10-CM

## 2016-04-02 DIAGNOSIS — I10 Essential (primary) hypertension: Secondary | ICD-10-CM

## 2016-04-02 DIAGNOSIS — E782 Mixed hyperlipidemia: Secondary | ICD-10-CM

## 2016-04-03 LAB — CBC WITH DIFFERENTIAL/PLATELET
BASOS ABS: 0.1 10*3/uL (ref 0.0–0.2)
BASOS: 1 %
EOS (ABSOLUTE): 0.3 10*3/uL (ref 0.0–0.4)
Eos: 3 %
HEMATOCRIT: 43.6 % (ref 37.5–51.0)
HEMOGLOBIN: 14.9 g/dL (ref 13.0–17.7)
Immature Grans (Abs): 0 10*3/uL (ref 0.0–0.1)
Immature Granulocytes: 0 %
LYMPHS ABS: 3.3 10*3/uL — AB (ref 0.7–3.1)
Lymphs: 32 %
MCH: 29.2 pg (ref 26.6–33.0)
MCHC: 34.2 g/dL (ref 31.5–35.7)
MCV: 86 fL (ref 79–97)
MONOCYTES: 8 %
Monocytes Absolute: 0.8 10*3/uL (ref 0.1–0.9)
NEUTROS ABS: 5.7 10*3/uL (ref 1.4–7.0)
Neutrophils: 56 %
Platelets: 268 10*3/uL (ref 150–379)
RBC: 5.1 x10E6/uL (ref 4.14–5.80)
RDW: 13.3 % (ref 12.3–15.4)
WBC: 10.1 10*3/uL (ref 3.4–10.8)

## 2016-04-03 LAB — COMPREHENSIVE METABOLIC PANEL
A/G RATIO: 2 (ref 1.2–2.2)
ALBUMIN: 4.4 g/dL (ref 3.5–5.5)
ALK PHOS: 48 IU/L (ref 39–117)
ALT: 32 IU/L (ref 0–44)
AST: 27 IU/L (ref 0–40)
BILIRUBIN TOTAL: 0.2 mg/dL (ref 0.0–1.2)
BUN / CREAT RATIO: 12 (ref 9–20)
BUN: 15 mg/dL (ref 6–24)
CHLORIDE: 98 mmol/L (ref 96–106)
CO2: 19 mmol/L (ref 18–29)
Calcium: 9.4 mg/dL (ref 8.7–10.2)
Creatinine, Ser: 1.24 mg/dL (ref 0.76–1.27)
GFR calc non Af Amer: 70 mL/min/{1.73_m2} (ref >59)
GFR, EST AFRICAN AMERICAN: 81 mL/min/{1.73_m2} (ref >59)
GLOBULIN, TOTAL: 2.2 g/dL (ref 1.5–4.5)
Glucose: 149 mg/dL — ABNORMAL HIGH (ref 65–99)
Potassium: 4.1 mmol/L (ref 3.5–5.2)
SODIUM: 137 mmol/L (ref 134–144)
TOTAL PROTEIN: 6.6 g/dL (ref 6.0–8.5)

## 2016-04-03 LAB — LIPID PANEL
CHOLESTEROL TOTAL: 154 mg/dL (ref 100–199)
Chol/HDL Ratio: 6.2 ratio units — ABNORMAL HIGH (ref 0.0–5.0)
HDL: 25 mg/dL — AB (ref >39)
LDL CALC: 58 mg/dL (ref 0–99)
TRIGLYCERIDES: 356 mg/dL — AB (ref 0–149)
VLDL CHOLESTEROL CAL: 71 mg/dL — AB (ref 5–40)

## 2016-04-03 LAB — HEMOGLOBIN A1C
Est. average glucose Bld gHb Est-mCnc: 154 mg/dL
Hgb A1c MFr Bld: 7 % — ABNORMAL HIGH (ref 4.8–5.6)

## 2016-04-03 LAB — TSH: TSH: 1.79 u[IU]/mL (ref 0.450–4.500)

## 2016-04-09 ENCOUNTER — Ambulatory Visit: Payer: Self-pay | Admitting: Adult Health Nurse Practitioner

## 2016-04-09 VITALS — BP 130/78 | HR 69 | Temp 98.0°F | Wt 236.0 lb

## 2016-04-09 DIAGNOSIS — E7849 Other hyperlipidemia: Secondary | ICD-10-CM

## 2016-04-09 DIAGNOSIS — I10 Essential (primary) hypertension: Secondary | ICD-10-CM

## 2016-04-09 DIAGNOSIS — J302 Other seasonal allergic rhinitis: Secondary | ICD-10-CM

## 2016-04-09 DIAGNOSIS — E119 Type 2 diabetes mellitus without complications: Secondary | ICD-10-CM

## 2016-04-09 MED ORDER — HYDROCHLOROTHIAZIDE 25 MG PO TABS: 25.0000 mg | ORAL_TABLET | Freq: Every day | ORAL | 0 refills | Status: DC

## 2016-04-09 MED ORDER — LISINOPRIL 40 MG PO TABS: 40.0000 mg | ORAL_TABLET | Freq: Every day | ORAL | 0 refills | Status: DC

## 2016-04-09 MED ORDER — FENOFIBRATE 145 MG PO TABS: 145.0000 mg | ORAL_TABLET | Freq: Every day | ORAL | 1 refills | Status: DC

## 2016-04-09 MED ORDER — CARVEDILOL 25 MG PO TABS: 25.0000 mg | ORAL_TABLET | Freq: Two times a day (BID) | ORAL | 3 refills | Status: DC

## 2016-04-09 MED ORDER — PANTOPRAZOLE SODIUM 20 MG PO TBEC: 20.0000 mg | DELAYED_RELEASE_TABLET | Freq: Every day | ORAL | 1 refills | Status: DC

## 2016-04-09 MED ORDER — FLUTICASONE PROPIONATE 50 MCG/ACT NA SUSP: 2.0000 | Freq: Every day | NASAL | 11 refills | Status: DC

## 2016-04-09 MED ORDER — METFORMIN HCL 500 MG PO TABS: 500.0000 mg | ORAL_TABLET | Freq: Every day | ORAL | 0 refills | Status: DC

## 2016-04-09 MED ORDER — FEXOFENADINE HCL 180 MG PO TABS: 180.0000 mg | ORAL_TABLET | Freq: Every day | ORAL | 3 refills | Status: DC

## 2016-04-09 MED ORDER — ISOSORBIDE MONONITRATE ER 60 MG PO TB24: 60.0000 mg | ORAL_TABLET | Freq: Two times a day (BID) | ORAL | 0 refills | Status: DC

## 2016-04-09 MED ORDER — ATORVASTATIN CALCIUM 10 MG PO TABS: 10.0000 mg | ORAL_TABLET | Freq: Every day | ORAL | 2 refills | Status: DC

## 2016-04-09 MED ORDER — AMLODIPINE BESYLATE 10 MG PO TABS: 10.0000 mg | ORAL_TABLET | Freq: Every day | ORAL | 1 refills | Status: DC

## 2016-04-09 NOTE — Progress Notes (Signed)
  Patient: Calvin Ortiz Male    DOB: 04/07/1971   45 y.o.   MRN: [id number] Visit Date: 04/09/2016  Today's Provider: Jacelyn Pieah Doles-Johnson, NP   Chief Complaint  Patient presents with  . Hip Pain    follow up to xray   Subjective:    HPI   Here for follow up of an R hip and labwork. Hip xray negative.    LDL-58 Taking atorvastatin.   A1C 7.0.  Not currently on any medications.   Last BP 128/76.  Taking medications as directed.  Reports low salt diet.  Monitoring process foods.   Allergies  Allergen Reactions  . Morphine And Related   . Sulfa Antibiotics    Previous Medications   AMLODIPINE (NORVASC) 10 MG TABLET    Take 1 tablet (10 mg total) by mouth daily.   ATORVASTATIN (LIPITOR) 10 MG TABLET    Take 1 tablet (10 mg total) by mouth daily.   CARVEDILOL (COREG) 25 MG TABLET    Take 25 mg by mouth 2 (two) times daily with a meal.   FENOFIBRATE (TRICOR) 145 MG TABLET    Take 145 mg by mouth daily.   FEXOFENADINE (ALLEGRA) 180 MG TABLET    Take 1 tablet (180 mg total) by mouth daily.   FLUTICASONE (FLONASE) 50 MCG/ACT NASAL SPRAY    Place 2 sprays into both nostrils daily.   HYDROCHLOROTHIAZIDE (HYDRODIURIL) 25 MG TABLET    Take 1 tablet by mouth daily.   ISOSORBIDE MONONITRATE (IMDUR) 60 MG 24 HR TABLET    Take 60 mg by mouth 2 (two) times daily.   LISINOPRIL (PRINIVIL,ZESTRIL) 40 MG TABLET    Take 40 mg by mouth daily.   PANTOPRAZOLE (PROTONIX) 20 MG TABLET    Take 20 mg by mouth daily.    Review of Systems  All other systems reviewed and are negative.   Social History  Substance Use Topics  . Smoking status: Former Smoker    Quit date: 09/18/2013  . Smokeless tobacco: Not on file  . Alcohol use No   Objective:   BP 130/78   Pulse 69   Temp 98 F (36.7 C)   Wt 236 lb (107 kg)   BMI 40.51 kg/m   Physical Exam  Constitutional: He is oriented to person, place, and time. He appears well-developed and well-nourished.  HENT:  Head: Normocephalic and  atraumatic.  Eyes: Pupils are equal, round, and reactive to light.  Neck: Normal range of motion. Neck supple.  Cardiovascular: Normal rate, regular rhythm and normal heart sounds.   Pulmonary/Chest: Effort normal and breath sounds normal.  Abdominal: Soft. Bowel sounds are normal.  Neurological: He is alert and oriented to person, place, and time.  Skin: Skin is warm and dry.  Psychiatric: He has a normal mood and affect.  Vitals reviewed.       Assessment & Plan:        DM:  New diagnosis.  A1C 7.0.  Refer to Endo clinic.  Start Metformin 500mg  daily.  Discussed SE profile.  Encourage changes in diet and exercise. 3-4 x weekly.   HTN: BP borderline-monitor at next OV.  Continue current therapy. Monitor diet.    HLD:  Controlled.  LDL at goal <100.  Continue current therapy.  Discussed dietary factors that affect triglycerides.   FU in 3 months for routine visit.      Jacelyn Pieah Doles-Johnson, NP   [facility]

## 2016-06-09 ENCOUNTER — Ambulatory Visit: Payer: Self-pay

## 2016-07-09 ENCOUNTER — Other Ambulatory Visit: Payer: Self-pay

## 2016-07-09 ENCOUNTER — Ambulatory Visit: Payer: Self-pay

## 2016-07-09 DIAGNOSIS — E7849 Other hyperlipidemia: Secondary | ICD-10-CM

## 2016-07-09 DIAGNOSIS — I10 Essential (primary) hypertension: Secondary | ICD-10-CM

## 2016-07-09 DIAGNOSIS — E119 Type 2 diabetes mellitus without complications: Secondary | ICD-10-CM

## 2016-07-10 LAB — LIPID PANEL
CHOLESTEROL TOTAL: 156 mg/dL (ref 100–199)
Chol/HDL Ratio: 5.4 ratio units — ABNORMAL HIGH (ref 0.0–5.0)
HDL: 29 mg/dL — ABNORMAL LOW (ref >39)
LDL CALC: 81 mg/dL (ref 0–99)
Triglycerides: 229 mg/dL — ABNORMAL HIGH (ref 0–149)
VLDL CHOLESTEROL CAL: 46 mg/dL — AB (ref 5–40)

## 2016-07-10 LAB — COMPREHENSIVE METABOLIC PANEL
A/G RATIO: 1.5 (ref 1.2–2.2)
ALBUMIN: 4.3 g/dL (ref 3.5–5.5)
ALK PHOS: 53 IU/L (ref 39–117)
ALT: 30 IU/L (ref 0–44)
AST: 25 IU/L (ref 0–40)
BUN / CREAT RATIO: 11 (ref 9–20)
BUN: 11 mg/dL (ref 6–24)
Bilirubin Total: 0.2 mg/dL (ref 0.0–1.2)
CALCIUM: 10.1 mg/dL (ref 8.7–10.2)
CO2: 19 mmol/L (ref 18–29)
CREATININE: 1 mg/dL (ref 0.76–1.27)
Chloride: 100 mmol/L (ref 96–106)
GFR calc Af Amer: 105 mL/min/{1.73_m2} (ref >59)
GFR, EST NON AFRICAN AMERICAN: 91 mL/min/{1.73_m2} (ref >59)
GLOBULIN, TOTAL: 2.8 g/dL (ref 1.5–4.5)
Glucose: 116 mg/dL — ABNORMAL HIGH (ref 65–99)
Potassium: 4.4 mmol/L (ref 3.5–5.2)
SODIUM: 140 mmol/L (ref 134–144)
Total Protein: 7.1 g/dL (ref 6.0–8.5)

## 2016-07-10 LAB — HEMOGLOBIN A1C
Est. average glucose Bld gHb Est-mCnc: 151 mg/dL
HEMOGLOBIN A1C: 6.9 % — AB (ref 4.8–5.6)

## 2016-07-16 ENCOUNTER — Ambulatory Visit: Payer: Self-pay

## 2016-07-29 ENCOUNTER — Emergency Department
Admission: EM | Admit: 2016-07-29 | Discharge: 2016-07-29 | Disposition: A | Discharge status: Home / Self Care | Payer: Medicaid Other | Attending: Emergency Medicine | Admitting: Emergency Medicine

## 2016-07-29 DIAGNOSIS — Z87891 Personal history of nicotine dependence: Secondary | ICD-10-CM | POA: Diagnosis not present

## 2016-07-29 DIAGNOSIS — I10 Essential (primary) hypertension: Secondary | ICD-10-CM | POA: Diagnosis not present

## 2016-07-29 DIAGNOSIS — E119 Type 2 diabetes mellitus without complications: Secondary | ICD-10-CM | POA: Diagnosis not present

## 2016-07-29 DIAGNOSIS — K047 Periapical abscess without sinus: Secondary | ICD-10-CM

## 2016-07-29 DIAGNOSIS — R51 Headache: Secondary | ICD-10-CM | POA: Diagnosis present

## 2016-07-29 LAB — CBC
HCT: 46.7 % (ref 40.0–52.0)
Hemoglobin: 16 g/dL (ref 13.0–18.0)
MCH: 29.6 pg (ref 26.0–34.0)
MCHC: 34.3 g/dL (ref 32.0–36.0)
MCV: 86.4 fL (ref 80.0–100.0)
PLATELETS: 287 10*3/uL (ref 150–440)
RBC: 5.4 MIL/uL (ref 4.40–5.90)
RDW: 13.4 % (ref 11.5–14.5)
WBC: 15.5 10*3/uL — AB (ref 3.8–10.6)

## 2016-07-29 LAB — COMPREHENSIVE METABOLIC PANEL
ALT: 25 U/L (ref 17–63)
AST: 26 U/L (ref 15–41)
Albumin: 3.9 g/dL (ref 3.5–5.0)
Alkaline Phosphatase: 40 U/L (ref 38–126)
Anion gap: 9 (ref 5–15)
BILIRUBIN TOTAL: 0.6 mg/dL (ref 0.3–1.2)
BUN: 13 mg/dL (ref 6–20)
CALCIUM: 8.9 mg/dL (ref 8.9–10.3)
CO2: 22 mmol/L (ref 22–32)
CREATININE: 0.98 mg/dL (ref 0.61–1.24)
Chloride: 102 mmol/L (ref 101–111)
Glucose, Bld: 172 mg/dL — ABNORMAL HIGH (ref 65–99)
Potassium: 3.5 mmol/L (ref 3.5–5.1)
Sodium: 133 mmol/L — ABNORMAL LOW (ref 135–145)
TOTAL PROTEIN: 7.3 g/dL (ref 6.5–8.1)

## 2016-07-29 LAB — LIPASE, BLOOD: LIPASE: 30 U/L (ref 11–51)

## 2016-07-29 MED ORDER — NAPROXEN 500 MG PO TABS: 500.0000 mg | ORAL_TABLET | Freq: Two times a day (BID) | ORAL | 0 refills | Status: DC

## 2016-07-29 MED ORDER — OXYCODONE-ACETAMINOPHEN 5-325 MG PO TABS
ORAL_TABLET | ORAL | Status: AC
  Administered 2016-07-29: 1 via ORAL
  Filled 2016-07-29: qty 1

## 2016-07-29 MED ORDER — OXYCODONE-ACETAMINOPHEN 5-325 MG PO TABS
1.0000 | ORAL_TABLET | Freq: Once | ORAL | Status: AC
  Administered 2016-07-29: 1 via ORAL

## 2016-07-29 MED ORDER — CLINDAMYCIN HCL 150 MG PO CAPS: 450.0000 mg | ORAL_CAPSULE | Freq: Three times a day (TID) | ORAL | 0 refills | Status: DC

## 2016-07-29 MED ORDER — OXYCODONE-ACETAMINOPHEN 5-325 MG PO TABS: 1.0000 | ORAL_TABLET | Freq: Four times a day (QID) | ORAL | 0 refills | Status: DC | PRN

## 2016-07-29 NOTE — ED Notes (Signed)
Pt states sinus infection x 3 days, now with frontal HA and L sided facial swelling noted along jaw line. Pt states pain goes from forehead to jaw to ear to neck. Pt is speaking in complete sentences. States since being in ED has thrown up 3 times.

## 2016-07-29 NOTE — ED Triage Notes (Signed)
Pt c/o left facial pain with swelling for the past 2-3 days, states today having N/V.Marland Kitchen

## 2016-07-29 NOTE — ED Provider Notes (Addendum)
[facility] Emergency Department Provider Note  ____________________________________________  Time seen: Approximately 9:58 PM  I have reviewed the triage vital signs and the nursing notes.   HISTORY  Chief Complaint Facial Pain and Emesis    HPI Calvin Ortiz is a 46 y.o. male who complains of left facial pain worsening over the past 3 days. Started with pain on his left jaw..  Has multiple chipped teeth.  No trauma. No fever. No difficulty breathing or swallowing.      Past Medical History:  Diagnosis Date  . GERD (gastroesophageal reflux disease)   . Hypertension   . Migraines   . Myocardial infarction    3 Stents  . Stroke [facility]      Patient Active Problem List   Diagnosis Date Noted  . Other hyperlipidemia 04/09/2016  . Type 2 diabetes mellitus without complication, without long-term current use of insulin (HCC) 04/09/2016  . Hypertension 10/30/2014  . Hypercholesterolemia 11/16/2013     Past Surgical History:  Procedure Laterality Date  . BACK SURGERY  2005 and 2007  . CORONARY ARTERY BYPASS GRAFT  2013     Prior to Admission medications   Medication Sig Start Date End Date Taking? Authorizing Provider  amLODipine (NORVASC) 10 MG tablet Take 1 tablet (10 mg total) by mouth daily. 04/09/16   Teah Doles-Johnson, NP  atorvastatin (LIPITOR) 10 MG tablet Take 1 tablet (10 mg total) by mouth daily. 04/09/16   Teah Doles-Johnson, NP  carvedilol (COREG) 25 MG tablet Take 1 tablet (25 mg total) by mouth 2 (two) times daily with a meal. 04/09/16   Teah Doles-Johnson, NP  clindamycin (CLEOCIN) 150 MG capsule Take 3 capsules (450 mg total) by mouth 3 (three) times daily. 07/29/16   Sharman Cheek, MD  fenofibrate (TRICOR) 145 MG tablet Take 1 tablet (145 mg total) by mouth daily. 04/09/16   Teah Doles-Johnson, NP  fexofenadine (ALLEGRA) 180 MG tablet Take 1 tablet (180 mg total) by mouth daily. 04/09/16 05/09/16  Teah Doles-Johnson, NP   fluticasone (FLONASE) 50 MCG/ACT nasal spray Place 2 sprays into both nostrils daily. 04/09/16   Teah Doles-Johnson, NP  hydrochlorothiazide (HYDRODIURIL) 25 MG tablet Take 1 tablet (25 mg total) by mouth daily. 04/09/16   Teah Doles-Johnson, NP  isosorbide mononitrate (IMDUR) 60 MG 24 hr tablet Take 1 tablet (60 mg total) by mouth 2 (two) times daily. 04/09/16   Teah Doles-Johnson, NP  lisinopril (PRINIVIL,ZESTRIL) 40 MG tablet Take 1 tablet (40 mg total) by mouth daily. 04/09/16   Teah Doles-Johnson, NP  metFORMIN (GLUCOPHAGE) 500 MG tablet Take 1 tablet (500 mg total) by mouth daily with breakfast. 04/09/16   Teah Doles-Johnson, NP  naproxen (NAPROSYN) 500 MG tablet Take 1 tablet (500 mg total) by mouth 2 (two) times daily with a meal. 07/29/16   Sharman Cheek, MD  oxyCODONE-acetaminophen (ROXICET) 5-325 MG tablet Take 1 tablet by mouth every 6 (six) hours as needed for severe pain. 07/29/16   Sharman Cheek, MD  pantoprazole (PROTONIX) 20 MG tablet Take 1 tablet (20 mg total) by mouth daily. 04/09/16   Teah Doles-Johnson, NP     Allergies Morphine and related and Sulfa antibiotics   Family History  Problem Relation Age of Onset  . COPD Mother   . Hypertension Mother   . Hyperlipidemia Mother   . COPD Father   . Diabetes Father   . Heart disease Father   . Diabetes Paternal Grandmother     Social History Social History  Substance Use Topics  . Smoking status: Former Smoker    Quit date: 09/18/2013  . Smokeless tobacco: Never Used  . Alcohol use No    Review of Systems  Constitutional:   No fever or chills.  ENT:   No sore throat. No rhinorrhea. L jaw pain.  Cardiovascular:   No chest pain. Respiratory:   No dyspnea or cough. Gastrointestinal:   Negative for abdominal pain, vomiting and diarrhea.  Genitourinary:   Negative for dysuria or difficulty urinating. Musculoskeletal:   Negative for focal pain or swelling Neurological:   Negative for headaches 10-point ROS  otherwise negative.  ____________________________________________   PHYSICAL EXAM:  VITAL SIGNS: ED Triage Vitals  Enc Vitals Group     BP 07/29/16 1707 (!) 170/92     Pulse Rate 07/29/16 1707 72     Resp --      Temp 07/29/16 1707 98.2 F (36.8 C)     Temp Source 07/29/16 1707 Oral     SpO2 07/29/16 1707 98 %     Weight 07/29/16 1715 235 lb (106.6 kg)     Height 07/29/16 1715  (1.626 m)     Head Circumference --      Peak Flow --      Pain Score 07/29/16 1715 9     Pain Loc --      Pain Edu? --      Excl. in GC? --     Vital signs reviewed, nursing assessments reviewed.   Constitutional:   Alert and oriented. Well appearing and in no distress. Eyes:   No scleral icterus. No conjunctival pallor. PERRL. EOMI.  No nystagmus. ENT   Head:   Normocephalic and atraumatic.   Nose:   No congestion/rhinnorhea. No septal hematoma   Mouth/Throat:   MMM, no pharyngeal erythema. No peritonsillar mass. + swelling and tenderness along Left lower gingiva.  Swelling extends into buccal space without fluctuance or obvious fluid collection.    Neck:   No stridor. No SubQ emphysema. No meningismus. Hematological/Lymphatic/Immunilogical:   No cervical lymphadenopathy. Cardiovascular:   RRR. Symmetric bilateral radial and DP pulses.  No murmurs.  Respiratory:   Normal respiratory effort without tachypnea nor retractions. Breath sounds are clear and equal bilaterally. No wheezes/rales/rhonchi. Gastrointestinal:   Soft and nontender. Non distended. There is no CVA tenderness.  No rebound, rigidity, or guarding. Genitourinary:   deferred Musculoskeletal:   Normal range of motion in all extremities. No joint effusions.  No lower extremity tenderness.  No edema. Neurologic:   Normal speech and language.  CN 2-10 normal. Motor grossly intact. No gross focal neurologic deficits are appreciated.   ____________________________________________    LABS (pertinent  positives/negatives) (all labs ordered are listed, but only abnormal results are displayed) Labs Reviewed  COMPREHENSIVE METABOLIC PANEL - Abnormal; Notable for the following:       Result Value   Sodium 133 (*)    Glucose, Bld 172 (*)    All other components within normal limits  CBC - Abnormal; Notable for the following:    WBC 15.5 (*)    All other components within normal limits  LIPASE, BLOOD   ____________________________________________   EKG   Interpreted by me Sinus rhythm rate 70. Nl axis. 1st degree av block. No acute ischemic changes. ____________________________________________    RADIOLOGY  No results found.  ____________________________________________   PROCEDURES Procedures  ____________________________________________   INITIAL IMPRESSION / ASSESSMENT AND PLAN / ED COURSE  Pertinent labs & imaging results  that were available during my care of the patient were reviewed by me and considered in my medical decision making (see chart for details).  Pt well appearing, nad. No evidence of pta, rpa, or abscess. Marland Kitchen Appears to be gingivitis with some secondary space inflammation due to tooth infection. Pt has appt with dentist in 1 week. Will tx with abx for now.  Has appt with pcp tomorrow as well. Encouraged to keep all appts.          ____________________________________________   FINAL CLINICAL IMPRESSION(S) / ED DIAGNOSES  Final diagnoses:  Dental infection      New Prescriptions   CLINDAMYCIN (CLEOCIN) 150 MG CAPSULE    Take 3 capsules (450 mg total) by mouth 3 (three) times daily.   NAPROXEN (NAPROSYN) 500 MG TABLET    Take 1 tablet (500 mg total) by mouth 2 (two) times daily with a meal.   OXYCODONE-ACETAMINOPHEN (ROXICET) 5-325 MG TABLET    Take 1 tablet by mouth every 6 (six) hours as needed for severe pain.     Portions of this note were generated with dragon dictation software. Dictation errors may occur despite best attempts at  proofreading.    Sharman Cheek, MD 07/29/16 2203    Sharman Cheek, MD 07/29/16 2204

## 2016-07-30 ENCOUNTER — Ambulatory Visit: Payer: Self-pay | Admitting: Urology

## 2016-07-30 VITALS — BP 156/98 | HR 75 | Temp 99.9°F | Ht 64.0 in | Wt 248.6 lb

## 2016-07-30 DIAGNOSIS — J302 Other seasonal allergic rhinitis: Secondary | ICD-10-CM

## 2016-07-30 DIAGNOSIS — B359 Dermatophytosis, unspecified: Secondary | ICD-10-CM

## 2016-07-30 DIAGNOSIS — E7849 Other hyperlipidemia: Secondary | ICD-10-CM

## 2016-07-30 DIAGNOSIS — I10 Essential (primary) hypertension: Secondary | ICD-10-CM

## 2016-07-30 MED ORDER — FENOFIBRATE 145 MG PO TABS: 145.0000 mg | ORAL_TABLET | Freq: Every day | ORAL | 1 refills | Status: DC

## 2016-07-30 MED ORDER — PANTOPRAZOLE SODIUM 20 MG PO TBEC: 20.0000 mg | DELAYED_RELEASE_TABLET | Freq: Every day | ORAL | 1 refills | Status: DC

## 2016-07-30 MED ORDER — HYDROCHLOROTHIAZIDE 25 MG PO TABS: 25.0000 mg | ORAL_TABLET | Freq: Every day | ORAL | 0 refills | Status: DC

## 2016-07-30 MED ORDER — LISINOPRIL 40 MG PO TABS: 40.0000 mg | ORAL_TABLET | Freq: Every day | ORAL | 0 refills | Status: DC

## 2016-07-30 MED ORDER — ATORVASTATIN CALCIUM 10 MG PO TABS: 10.0000 mg | ORAL_TABLET | Freq: Every day | ORAL | 2 refills | Status: DC

## 2016-07-30 MED ORDER — CARVEDILOL 25 MG PO TABS: 25.0000 mg | ORAL_TABLET | Freq: Two times a day (BID) | ORAL | 3 refills | Status: DC

## 2016-07-30 MED ORDER — FEXOFENADINE HCL 180 MG PO TABS: 180.0000 mg | ORAL_TABLET | Freq: Every day | ORAL | 3 refills | Status: DC

## 2016-07-30 MED ORDER — METFORMIN HCL 500 MG PO TABS: 500.0000 mg | ORAL_TABLET | Freq: Every day | ORAL | 0 refills | Status: DC

## 2016-07-30 MED ORDER — AMLODIPINE BESYLATE 10 MG PO TABS: 10.0000 mg | ORAL_TABLET | Freq: Every day | ORAL | 1 refills | Status: DC

## 2016-07-30 MED ORDER — FLUTICASONE PROPIONATE 50 MCG/ACT NA SUSP: 2.0000 | Freq: Every day | NASAL | 11 refills | Status: DC

## 2016-07-30 MED ORDER — TRIAMCINOLONE ACETONIDE 0.025 % EX OINT: 1.0000 "application " | TOPICAL_OINTMENT | Freq: Two times a day (BID) | CUTANEOUS | 0 refills | Status: DC

## 2016-07-30 MED ORDER — ISOSORBIDE MONONITRATE ER 60 MG PO TB24: 60.0000 mg | ORAL_TABLET | Freq: Two times a day (BID) | ORAL | 0 refills | Status: DC

## 2016-07-30 NOTE — Progress Notes (Signed)
  Patient: Calvin Ortiz Male    DOB: 1970/11/16   46 y.o.   MRN: [id number] Visit Date: 07/30/2016  Today's Provider: [facility]   Chief Complaint  Patient presents with  . Follow-up    blood work   Subjective:    HPI 46 yo with tooth abscess who was seen in the ED last evening for left side facial swelling.  He was placed on Clindamycin and given Naproxsyn and Percocet.  He just started his antibiotic this morning.    He is also having new lesions crop up on his legs.  He has several lesions on his lower legs and the top of his feet.  They are not itching or painful.      Allergies  Allergen Reactions  . Morphine And Related   . Sulfa Antibiotics    Previous Medications   CLINDAMYCIN (CLEOCIN) 150 MG CAPSULE    Take 3 capsules (450 mg total) by mouth 3 (three) times daily.   NAPROXEN (NAPROSYN) 500 MG TABLET    Take 1 tablet (500 mg total) by mouth 2 (two) times daily with a meal.   OXYCODONE-ACETAMINOPHEN (ROXICET) 5-325 MG TABLET    Take 1 tablet by mouth every 6 (six) hours as needed for severe pain.    Review of Systems  Social History  Substance Use Topics  . Smoking status: Former Smoker    Quit date: 09/18/2013  . Smokeless tobacco: Never Used  . Alcohol use No   Objective:   BP (!) 156/98   Pulse 75   Temp 99.9 F (37.7 C)   Ht  (1.626 m)   Wt 248 lb 9.6 oz (112.8 kg)   BMI 42.67 kg/m   Physical Exam Constitutional: Well nourished. Alert and oriented, No acute distress. HEENT: [place] AT, moist mucus membranes. Trachea midline, no masses. Cardiovascular: No clubbing, cyanosis, or edema. Respiratory: Normal respiratory effort, no increased work of breathing. GI: Abdomen is soft, non tender, non distended, no abdominal masses. Liver and spleen not palpable.  No hernias appreciated.  Stool sample for occult testing is not indicated.   GU: No CVA tenderness.   Skin: Scattered annular lesions on lower extremities, largest being 7 cm x 6  mm, the rest 5 mm or smaller in diameter, no bruises or suspicious lesions. Lymph: No cervical or inguinal adenopathy. Neurologic: Grossly intact, no focal deficits, moving all 4 extremities. Psychiatric: Normal mood and affect.      Assessment & Plan:     1. Tooth abscess with associated inflammation  - continue the antibiotics  - reviewed red flag signs  2. Annular lesions on LE  - trial of steroid cream  - RTC in two weeks      [facility]   [facility]

## 2016-08-13 ENCOUNTER — Ambulatory Visit: Payer: Self-pay | Admitting: Adult Health Nurse Practitioner

## 2016-08-13 VITALS — BP 126/75 | HR 75 | Temp 98.1°F | Wt 248.1 lb

## 2016-08-13 DIAGNOSIS — E119 Type 2 diabetes mellitus without complications: Secondary | ICD-10-CM

## 2016-08-13 DIAGNOSIS — E7800 Pure hypercholesterolemia, unspecified: Secondary | ICD-10-CM

## 2016-08-13 DIAGNOSIS — I10 Essential (primary) hypertension: Secondary | ICD-10-CM

## 2016-08-13 LAB — GLUCOSE, POCT (MANUAL RESULT ENTRY): POC GLUCOSE: 160 mg/dL — AB (ref 70–99)

## 2016-08-13 NOTE — Progress Notes (Signed)
Patient: Calvin Ortiz Male    DOB: Aug 06, 1970   46 y.o.   MRN: [id number] Visit Date: 08/13/2016  Today's Provider: Jacelyn Pi, NP   Chief Complaint  Patient presents with  . Follow-up   Subjective:    HPI   Seen two weeks ago for annular lesion on LE.  Given steroid cream.  Areas have improved with treatment.     DM:  Taking medications as directed.  A1C 6.9 in March.  Monitoring diet somewhat, walking daily for exercise.  Not checking CBGs at home.   HLD:  Taking medications as directed.  Last LDL 81.   HTN: Taking medications as directed.  Denies any headaches, CP.    Allergies  Allergen Reactions  . Morphine And Related   . Sulfa Antibiotics    Previous Medications   AMLODIPINE (NORVASC) 10 MG TABLET    Take 1 tablet (10 mg total) by mouth daily.   ATORVASTATIN (LIPITOR) 10 MG TABLET    Take 1 tablet (10 mg total) by mouth daily.   CARVEDILOL (COREG) 25 MG TABLET    Take 1 tablet (25 mg total) by mouth 2 (two) times daily with a meal.   CLINDAMYCIN (CLEOCIN) 150 MG CAPSULE    Take 3 capsules (450 mg total) by mouth 3 (three) times daily.   FENOFIBRATE (TRICOR) 145 MG TABLET    Take 1 tablet (145 mg total) by mouth daily.   FEXOFENADINE (ALLEGRA) 180 MG TABLET    Take 1 tablet (180 mg total) by mouth daily.   FLUTICASONE (FLONASE) 50 MCG/ACT NASAL SPRAY    Place 2 sprays into both nostrils daily.   HYDROCHLOROTHIAZIDE (HYDRODIURIL) 25 MG TABLET    Take 1 tablet (25 mg total) by mouth daily.   ISOSORBIDE MONONITRATE (IMDUR) 60 MG 24 HR TABLET    Take 1 tablet (60 mg total) by mouth 2 (two) times daily.   LISINOPRIL (PRINIVIL,ZESTRIL) 40 MG TABLET    Take 1 tablet (40 mg total) by mouth daily.   METFORMIN (GLUCOPHAGE) 500 MG TABLET    Take 1 tablet (500 mg total) by mouth daily with breakfast.   NAPROXEN (NAPROSYN) 500 MG TABLET    Take 1 tablet (500 mg total) by mouth 2 (two) times daily with a meal.   OXYCODONE-ACETAMINOPHEN (ROXICET) 5-325 MG  TABLET    Take 1 tablet by mouth every 6 (six) hours as needed for severe pain.   PANTOPRAZOLE (PROTONIX) 20 MG TABLET    Take 1 tablet (20 mg total) by mouth daily.   TRIAMCINOLONE (KENALOG) 0.025 % OINTMENT    Apply 1 application topically 2 (two) times daily.    Review of Systems  All other systems reviewed and are negative.   Social History  Substance Use Topics  . Smoking status: Former Smoker    Quit date: 09/18/2013  . Smokeless tobacco: Never Used  . Alcohol use No   Objective:   BP 126/75   Pulse 75   Temp 98.1 F (36.7 C)   Wt 248 lb 1.6 oz (112.5 kg)   BMI 42.59 kg/m   Physical Exam  Constitutional: He is oriented to person, place, and time. He appears well-developed and well-nourished.  HENT:  Head: Normocephalic and atraumatic.  Eyes: Pupils are equal, round, and reactive to light.  Neck: Normal range of motion. Neck supple.  Cardiovascular: Normal rate, regular rhythm and normal heart sounds.   Pulmonary/Chest: Effort normal and breath sounds normal.  Abdominal: Soft. Bowel sounds are normal.  Neurological: He is alert and oriented to person, place, and time.  Skin: Skin is warm and dry.  Vitals reviewed.       Assessment & Plan:         DM:  Controlled.  Encourage diabetic diet and exercise.  Continue current medication regimen.   HLD:  Controlled.   Continue current regimen.  Encourage low cholesterol, low fat diet and exercise.   HTN:  Controlled.  Goal BP <140/80.  Continue current medication regimen.  Encourage low salt diet and exercise.   Annular lesions improved.        Jacelyn Pi, NP   [facility]

## 2016-09-22 ENCOUNTER — Telehealth: Payer: Self-pay | Admitting: Pharmacy Technician

## 2016-09-22 NOTE — Telephone Encounter (Signed)
Patient eligible to receive medication assistance at [facility] until 05/21/17 as long as eligibility requirements continue to be met.  Betty J. Kluttz Care Manager [facility] 

## 2016-10-08 ENCOUNTER — Other Ambulatory Visit: Payer: Self-pay

## 2016-10-08 DIAGNOSIS — I10 Essential (primary) hypertension: Secondary | ICD-10-CM

## 2016-10-08 MED ORDER — ISOSORBIDE MONONITRATE ER 60 MG PO TB24: 60.0000 mg | ORAL_TABLET | Freq: Two times a day (BID) | ORAL | 0 refills | Status: DC

## 2016-10-29 ENCOUNTER — Ambulatory Visit: Payer: Self-pay | Admitting: Ophthalmology

## 2016-10-29 ENCOUNTER — Other Ambulatory Visit: Payer: Self-pay | Admitting: Internal Medicine

## 2016-10-29 DIAGNOSIS — I10 Essential (primary) hypertension: Secondary | ICD-10-CM

## 2016-10-29 LAB — HM DIABETES EYE EXAM

## 2016-11-11 ENCOUNTER — Other Ambulatory Visit: Payer: Self-pay | Admitting: Ophthalmology

## 2016-11-23 ENCOUNTER — Other Ambulatory Visit: Payer: Self-pay | Admitting: Internal Medicine

## 2016-12-03 ENCOUNTER — Other Ambulatory Visit: Payer: Self-pay

## 2016-12-10 ENCOUNTER — Ambulatory Visit: Payer: Self-pay

## 2016-12-10 ENCOUNTER — Other Ambulatory Visit: Payer: Self-pay

## 2016-12-10 DIAGNOSIS — I10 Essential (primary) hypertension: Secondary | ICD-10-CM

## 2016-12-10 DIAGNOSIS — E119 Type 2 diabetes mellitus without complications: Secondary | ICD-10-CM

## 2016-12-10 DIAGNOSIS — E7800 Pure hypercholesterolemia, unspecified: Secondary | ICD-10-CM

## 2016-12-11 LAB — COMPREHENSIVE METABOLIC PANEL
A/G RATIO: 1.4 (ref 1.2–2.2)
ALBUMIN: 3.9 g/dL (ref 3.5–5.5)
ALK PHOS: 42 IU/L (ref 39–117)
ALT: 34 IU/L (ref 0–44)
AST: 25 IU/L (ref 0–40)
BUN / CREAT RATIO: 13 (ref 9–20)
BUN: 15 mg/dL (ref 6–24)
CHLORIDE: 102 mmol/L (ref 96–106)
CO2: 21 mmol/L (ref 20–29)
Calcium: 9.4 mg/dL (ref 8.7–10.2)
Creatinine, Ser: 1.17 mg/dL (ref 0.76–1.27)
GFR calc non Af Amer: 75 mL/min/{1.73_m2} (ref >59)
GFR, EST AFRICAN AMERICAN: 87 mL/min/{1.73_m2} (ref >59)
GLOBULIN, TOTAL: 2.7 g/dL (ref 1.5–4.5)
Glucose: 159 mg/dL — ABNORMAL HIGH (ref 65–99)
Potassium: 3.8 mmol/L (ref 3.5–5.2)
SODIUM: 138 mmol/L (ref 134–144)
TOTAL PROTEIN: 6.6 g/dL (ref 6.0–8.5)

## 2016-12-11 LAB — HEMOGLOBIN A1C
Est. average glucose Bld gHb Est-mCnc: 146 mg/dL
HEMOGLOBIN A1C: 6.7 % — AB (ref 4.8–5.6)

## 2016-12-11 LAB — LIPID PANEL
CHOL/HDL RATIO: 6.3 ratio — AB (ref 0.0–5.0)
Cholesterol, Total: 139 mg/dL (ref 100–199)
HDL: 22 mg/dL — ABNORMAL LOW (ref >39)
LDL CALC: 55 mg/dL (ref 0–99)
TRIGLYCERIDES: 312 mg/dL — AB (ref 0–149)
VLDL Cholesterol Cal: 62 mg/dL — ABNORMAL HIGH (ref 5–40)

## 2016-12-17 ENCOUNTER — Ambulatory Visit: Payer: Self-pay | Admitting: Adult Health Nurse Practitioner

## 2016-12-17 VITALS — BP 116/68 | HR 64 | Temp 99.3°F | Wt 244.2 lb

## 2016-12-17 DIAGNOSIS — E7800 Pure hypercholesterolemia, unspecified: Secondary | ICD-10-CM

## 2016-12-17 DIAGNOSIS — E119 Type 2 diabetes mellitus without complications: Secondary | ICD-10-CM

## 2016-12-17 DIAGNOSIS — I10 Essential (primary) hypertension: Secondary | ICD-10-CM

## 2016-12-17 LAB — GLUCOSE, POCT (MANUAL RESULT ENTRY): POC GLUCOSE: 111 mg/dL — AB (ref 70–99)

## 2016-12-17 NOTE — Progress Notes (Signed)
Patient: Calvin Ortiz Male    DOB: 02/29/1971   46 y.o.   MRN: [id number] Visit Date: 12/17/2016  Today's Provider: Jacelyn Pieah Doles-Johnson, NP   Chief Complaint  Patient presents with  . Follow-up    Blood work 2 days ago   Subjective:    HPI   DM:  Taking medications as directed.  A1c is 6.7.   HLD:  Taking medications as directed. LFTs WNL.  LDL: 55.   HTN:  Taking medications as directed.       Allergies  Allergen Reactions  . Morphine And Related   . Sulfa Antibiotics Rash   Previous Medications   AMLODIPINE (NORVASC) 10 MG TABLET    Take 1 tablet (10 mg total) by mouth daily.   ATORVASTATIN (LIPITOR) 10 MG TABLET    Take 1 tablet (10 mg total) by mouth daily.   CARVEDILOL (COREG) 25 MG TABLET    Take 1 tablet (25 mg total) by mouth 2 (two) times daily with a meal.   FENOFIBRATE (TRICOR) 145 MG TABLET    Take 1 tablet (145 mg total) by mouth daily.   FEXOFENADINE (ALLEGRA) 180 MG TABLET    Take 1 tablet (180 mg total) by mouth daily.   FLUTICASONE (FLONASE) 50 MCG/ACT NASAL SPRAY    Place 2 sprays into both nostrils daily.   HYDROCHLOROTHIAZIDE (HYDRODIURIL) 25 MG TABLET    TAKE ONE TABLET BY MOUTH EVERY DAY   ISOSORBIDE MONONITRATE (IMDUR) 60 MG 24 HR TABLET    Take 1 tablet (60 mg total) by mouth 2 (two) times daily.   LISINOPRIL (PRINIVIL,ZESTRIL) 40 MG TABLET    TAKE ONE TABLET BY MOUTH EVERY DAY   METFORMIN (GLUCOPHAGE) 500 MG TABLET    TAKE ONE TABLET BY MOUTH EVERY DAY WITH BREAKFAST   PANTOPRAZOLE (PROTONIX) 20 MG TABLET    Take 1 tablet (20 mg total) by mouth daily.   TRIAMCINOLONE (KENALOG) 0.025 % OINTMENT    Apply 1 application topically 2 (two) times daily.    Review of Systems  All other systems reviewed and are negative.   Social History  Substance Use Topics  . Smoking status: Former Smoker    Quit date: 09/18/2013  . Smokeless tobacco: Never Used  . Alcohol use No   Objective:   BP 116/68   Pulse 64   Temp 99.3 F (37.4 C)   Wt  244 lb 3.2 oz (110.8 kg)   BMI 41.92 kg/m   Physical Exam  Constitutional: He is oriented to person, place, and time. He appears well-developed and well-nourished.  HENT:  Head: Normocephalic and atraumatic.  Neck: Normal range of motion. Neck supple.  Cardiovascular: Normal rate, regular rhythm, normal heart sounds and intact distal pulses.   Pulmonary/Chest: Effort normal and breath sounds normal.  Abdominal: Soft. Bowel sounds are normal.  Neurological: He is alert and oriented to person, place, and time.  Skin: Skin is warm and dry.  Vitals reviewed.       Assessment & Plan:         DM:  Controlled.  Encourage diabetic diet and exercise.  Continue current medication regimen.   HLD:  Controlled.   Continue current regimen.  Encourage low cholesterol, low fat diet and exercise.   HTN:  Controlled.  Goal BP <140/90.  Continue current medication regimen.  Encourage low salt diet and exercise.          Jacelyn Pieah Doles-Johnson, NP   [facility]

## 2017-01-06 ENCOUNTER — Ambulatory Visit: Payer: Self-pay | Admitting: Specialist

## 2017-01-13 ENCOUNTER — Ambulatory Visit: Payer: Self-pay | Admitting: Specialist

## 2017-01-15 ENCOUNTER — Other Ambulatory Visit: Payer: Self-pay | Admitting: Urology

## 2017-01-15 DIAGNOSIS — J302 Other seasonal allergic rhinitis: Secondary | ICD-10-CM

## 2017-03-18 ENCOUNTER — Ambulatory Visit: Payer: Medicaid Other

## 2017-03-18 DIAGNOSIS — Z23 Encounter for immunization: Secondary | ICD-10-CM

## 2017-04-14 ENCOUNTER — Telehealth: Payer: Self-pay | Admitting: Pharmacy Technician

## 2017-04-14 NOTE — Telephone Encounter (Signed)
Pt has pharmacy coverage through Medicaid.  [facility] unable to provide medication assistance.  Sherilyn DacostaBetty J. Jariah Tarkowski Care Manager [facility]

## 2017-04-22 ENCOUNTER — Other Ambulatory Visit: Payer: Medicaid Other

## 2017-05-21 ENCOUNTER — Other Ambulatory Visit: Payer: Self-pay

## 2017-05-21 ENCOUNTER — Encounter: Payer: Self-pay | Admitting: Nurse Practitioner

## 2017-05-21 ENCOUNTER — Ambulatory Visit: Payer: Medicaid Other | Admitting: Nurse Practitioner

## 2017-05-21 VITALS — BP 100/51 | HR 59 | Temp 98.0°F | Ht 64.0 in | Wt 240.2 lb

## 2017-05-21 DIAGNOSIS — F331 Major depressive disorder, recurrent, moderate: Secondary | ICD-10-CM

## 2017-05-21 DIAGNOSIS — Z7689 Persons encountering health services in other specified circumstances: Secondary | ICD-10-CM | POA: Diagnosis not present

## 2017-05-21 DIAGNOSIS — F4010 Social phobia, unspecified: Secondary | ICD-10-CM

## 2017-05-21 MED ORDER — FLUOXETINE HCL 20 MG PO CAPS: 20.0000 mg | ORAL_CAPSULE | Freq: Every day | ORAL | 2 refills | Status: DC

## 2017-05-21 NOTE — Progress Notes (Signed)
Subjective:    Patient ID: Calvin Ortiz, male    DOB: 06/25/1970, 47 y.o.   MRN: [id number]  Calvin Ortiz is a 47 y.o. male presenting on 05/21/2017 for Establish Care (diabetes)   HPI Establish Care New Provider Pt last seen by PCP [facility] and now has medicaid.  Obtain records.  Pt wants to continue care for chronic diseases: diabetes, GERD, HTN, hyperlipidemia, CAD, sleep apnea.  Today is concerned for anxiety depression as he has had a visit for chronic diseases within the last 6 months and still has medications.  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  Racing thoughts prevent relaxation - constant to-do list.  Irritability is more personality trait, but traffic is a trigger currently and this is better with less stress. - feels very down, anhedonia, lots of tearfulness - notes he has had trouble with dealing with his father's death - Also experiences swings in appetite - Pt is still processing his grief from his father's death. Largely notes he has avoided his feelings.  Most affects him when he is in public or going back home from mom/dad's house  Stays strong when with his mother are most difficult times for overwhelming grief. - Social anxiety and in large crowds does not do well. When he is in an uncomfortable situation he notes sweating and shaking.  - Has been on fluoxetine and Zoloft before age [age] and has not had any pharmacological treatment since that time.  Was mostly able to manage symptoms until the last year.   Depression screen PHQ 2/9 05/21/2017  Decreased Interest 2  Down, Depressed, Hopeless 2  PHQ - 2 Score 4  Altered sleeping 2  Tired, decreased energy 3  Change in appetite 3  Feeling bad or failure about yourself  0  Trouble concentrating 2  Moving slowly or fidgety/restless 0  Suicidal thoughts 0  PHQ-9 Score  14  Difficult doing work/chores Somewhat difficult   GAD 7 : Generalized Anxiety Score 05/21/2017  Nervous, Anxious, on Edge 3  Control/stop worrying 0  Worry too much - different things 0  Trouble relaxing 1  Restless 0  Easily annoyed or irritable 3  Afraid - awful might happen 0  Total GAD 7 Score 7  Anxiety Difficulty Somewhat difficult    Past Medical History:  Diagnosis Date  . Anxiety   . Depression   . Diabetes mellitus without complication (HCC)   . GERD (gastroesophageal reflux disease)   . Hyperlipidemia   . Hypertension   . Migraines   . Myocardial infarction (HCC)    3 Stents  . Sleep apnea   . Stroke [facility](HCC)    Past Surgical History:  Procedure Laterality Date  . BACK SURGERY  2005 and 2007  . CORONARY ANGIOPLASTY WITH STENT PLACEMENT    . CORONARY ARTERY BYPASS GRAFT  2013   Social History   Socioeconomic History  . Marital status: Single    Spouse name: Not on file  . Number of children: Not on file  . Years of education: Not on file  . Highest education level: Not on file  Social Needs  . Financial resource strain: Not on file  . Food insecurity - worry: Not on file  . Food insecurity - inability: Not on file  . Transportation needs - medical: Not on file  . Transportation needs -  non-medical: Not on file  Occupational History  . Not on file  Tobacco Use  . Smoking status: Current Every Day Smoker    Packs/day: 0.50    Years: 25.00    Pack years: 12.50    Types: Cigarettes  . Smokeless tobacco: Never Used  . Tobacco comment: Was quit for about 14 months before resuming (has started/stopped total of 5 times)  Substance and Sexual Activity  . Alcohol use: No  . Drug use: No  . Sexual activity: Not Currently  Other Topics Concern  . Not on file  Social History Narrative  . Not on file   Family History  Problem Relation Age of Onset  . COPD Mother   . Hypertension Mother   . Hyperlipidemia Mother   . Depression Mother   . Heart  disease Mother   . COPD Father   . Diabetes Father   . Heart disease Father   . Stroke Father   . Cancer Father        pelvic mass  . Lung cancer Father   . Depression Father   . Diabetes Paternal Grandmother    Current Outpatient Medications on File Prior to Visit  Medication Sig  . amLODipine (NORVASC) 10 MG tablet Take 1 tablet (10 mg total) by mouth daily.  Marland Kitchen aspirin EC 81 MG tablet Take 81 mg by mouth daily.  Marland Kitchen atorvastatin (LIPITOR) 10 MG tablet Take 1 tablet (10 mg total) by mouth daily.  . carvedilol (COREG) 25 MG tablet Take 1 tablet (25 mg total) by mouth 2 (two) times daily with a meal.  . fenofibrate (TRICOR) 145 MG tablet Take 1 tablet (145 mg total) by mouth daily.  . fluticasone (FLONASE) 50 MCG/ACT nasal spray Place 2 sprays into both nostrils daily.  . hydrochlorothiazide (HYDRODIURIL) 25 MG tablet TAKE ONE TABLET BY MOUTH EVERY DAY  . isosorbide mononitrate (IMDUR) 60 MG 24 hr tablet Take 1 tablet (60 mg total) by mouth 2 (two) times daily. (Patient taking differently: Take 60 mg by mouth daily. )  . lisinopril (PRINIVIL,ZESTRIL) 40 MG tablet TAKE ONE TABLET BY MOUTH EVERY DAY  . metFORMIN (GLUCOPHAGE) 500 MG tablet TAKE ONE TABLET BY MOUTH EVERY DAY WITH BREAKFAST  . pantoprazole (PROTONIX) 20 MG tablet Take 1 tablet (20 mg total) by mouth daily.  Marland Kitchen triamcinolone (KENALOG) 0.025 % ointment Apply 1 application topically 2 (two) times daily. (Patient not taking: Reported on 05/21/2017)   Current Facility-Administered Medications on File Prior to Visit  Medication  . fluticasone (FLONASE) 50 MCG/ACT nasal spray 2 spray    Review of Systems  Constitutional: Negative.   HENT: Negative.   Eyes: Negative.   Respiratory: Negative.   Cardiovascular: Negative.   Gastrointestinal: Negative.   Endocrine: Negative.   Genitourinary: Negative.   Musculoskeletal: Positive for arthralgias.  Skin: Negative.   Allergic/Immunologic: Negative.   Neurological: Negative.     Hematological: Negative.   Psychiatric/Behavioral: Positive for agitation, dysphoric mood and sleep disturbance. Negative for behavioral problems, confusion, decreased concentration, hallucinations, self-injury and suicidal ideas. The patient is nervous/anxious. The patient is not hyperactive.    Per HPI unless specifically indicated above     Objective:    BP (!) 100/51 (BP Location: Right Arm, Patient Position: Sitting, Cuff Size: Large)   Pulse (!) 59   Temp 98 F (36.7 C) (Oral)   Ht 5\' 4"  (1.626 m)   Wt 240 lb 3.2 oz (109 kg)   SpO2 98%   BMI 41.23  kg/m   Wt Readings from Last 3 Encounters:  05/21/17 240 lb 3.2 oz (109 kg)  12/17/16 244 lb 3.2 oz (110.8 kg)  08/13/16 248 lb 1.6 oz (112.5 kg)    Physical Exam  General - morbidly obese, well-appearing, NAD HEENT - Normocephalic, atraumatic Neck - supple, non-tender, no LAD, no thyromegaly, no carotid bruit Heart - RRR, no murmurs heard Lungs - Clear throughout all lobes, no wheezing, crackles, or rhonchi. Normal work of breathing. Extremeties - non-tender, no edema, cap refill < 2 seconds, peripheral pulses intact +2 bilaterally Skin - warm, dry Neuro - awake, alert, oriented x3, normal gait Psych - Depressed mood and affect, normal behavior, participates well in conversation and is appropriate.  Results for orders placed or performed in visit on 12/17/16  POCT Glucose (CBG)  Result Value Ref Range   POC Glucose 111 (A) 70 - 99 mg/dl      Assessment & Plan:   Problem List Items Addressed This Visit    None    Visit Diagnoses    Moderate episode of recurrent major depressive disorder (HCC)    -  Primary   Relevant Medications   FLUoxetine (PROZAC) 20 MG capsule   Other Relevant Orders   Ambulatory referral to Social Work   Social anxiety disorder       Relevant Medications   FLUoxetine (PROZAC) 20 MG capsule   Other Relevant Orders   Ambulatory referral to Social Work  Pt with uncontrolled anxiety and  depression.  Negatively influenced by inappropriate grief response.  Death > 1 year ago and has not processed feelings of loss.  Has positive PHQ-9 and GAD 7 scores to indicate severe anxiety and depression.  Anxiety is greater in social situations.   Plan: 1. Pt will benefit from counseling for grief and CBT for anxiety/depression.  Referral placed to Sharol Roussel at [facility]. 2. START fluoxetine 20 mg once daily as pt has had medication in past and would like to resume pharmacotherapy. 3. Discussed briefly deep breathing and body scan as tools to assist him with anxiety, overwhelming grief. 4. Followup 6 weeks.    Encounter to establish care     [facility].  Records will be reviewed in [facility].  Past medical, family, and surgical history reviewed w/ pt.      Meds ordered this encounter  Medications  . FLUoxetine (PROZAC) 20 MG capsule    Sig: Take 1 capsule (20 mg total) by mouth daily.    Dispense:  30 capsule    Refill:  2    Order Specific Question:   Supervising Provider    Answer:   Smitty Cords [[id number]]    Follow up plan: Return in about 6 weeks (around 07/02/2017) for anxiety and depression.  [name], DNP, AGPCNP-BC Adult Gerontology Primary Care Nurse Practitioner [facility] 06/14/2017, 9:01 AM

## 2017-05-21 NOTE — Patient Instructions (Addendum)
Calvin Ortiz, Thank you for coming in to clinic today.  1. For your anxiety and depression: - Start fluoxetine 20 mg capsules once daily. - Please start seeing Mr. [name] at [facility] [address] [place], [place] [address]  Phone: [phone]   Please schedule a follow-up appointment with [name], AGNP. Return in about 6 weeks (around 07/02/2017) for anxiety and depression.  If you have any other questions or concerns, please feel free to call the clinic or send a message through MyChart. You may also schedule an earlier appointment if necessary.  You will receive a survey after today's visit either digitally by e-mail or paper by [employer]. Your experiences and feedback matter to [place].  Please respond so we know how we are doing as we provide care for you.   [name], DNP, AGNP-BC Adult Gerontology Nurse Practitioner [facility]    Complicated Grieving Grief is a normal response to the death of someone close to you. Feelings of fear, anger, and guilt can affect almost everyone who loses a loved one. It is also common to have symptoms of depression while you are grieving. These include problems with sleep, loss of appetite, and lack of energy. They may last for weeks or months after a loss. Complicated grief is different from normal grief or depression. Normal grieving involves sadness and feelings of loss, but these feelings are not constant. Complicated grief is a constant and severe type of grief. It interferes with your ability to function normally. It may last for several months to a year or longer. Complicated grief may require treatment from a mental health care provider. What are the causes? It is not known why some people continue to struggle with grief and others do not. You may be at higher risk for complicated grief if:  The death of your loved one was sudden or unexpected.  The death of your loved one was due  to a violent event.  Your loved one committed suicide.  Your loved one was a child or a young person.  You were very close to or dependent on the loved one.  You have a history of depression.  What are the signs or symptoms? Signs and symptoms of complicated grief may include:  Feeling disbelief or numbness.  Being unable to enjoy good memories of your loved one.  Needing to avoid anything that reminds you of your loved one.  Being unable to stop thinking about the death.  Feeling intense anger or guilt.  Feeling alone and hopeless.  Feeling that your life is meaningless and empty.  Losing the desire to live.  How is this diagnosed? Your health care provider may diagnose complicated grief if:  You have constant symptoms of grief for 6-12 months or longer.  Your symptoms are interfering with your ability to live your life.  Your health care provider may want you to see a mental health care provider. Many symptoms of depression are similar to the symptoms of complicated grief. It is important to be evaluated for complicated grief along with other mental health conditions. How is this treated? Talk therapy with a mental health provider is the most common treatment for complicated grief. During therapy, you will learn healthy ways to cope with the loss of your loved one. In some cases, your mental health care provider may also recommend antidepressant medicines. Follow these instructions at home:  Take care of yourself. ? Eat regular meals and maintain  a healthy diet. Eat plenty of fruits, vegetables, and whole grains. ? Try to get some exercise each day. ? Keep regular hours for sleep. Try to get at least 8 hours of sleep each night.  Do not use drugs or alcohol to ease your symptoms.  Take medicines only as directed by your health care provider.  Spend time with friends and loved ones.  Consider joining a grief (bereavement) support group to help you deal with your  loss.  Keep all follow-up visits as directed by your health care provider. This is important. Contact a health care provider if:  Your symptoms keep you from functioning normally.  Your symptoms do not get better with treatment. Get help right away if:  You have serious thoughts of hurting yourself or someone else.  You have suicidal feelings. This information is not intended to replace advice given to you by your health care provider. Make sure you discuss any questions you have with your health care provider. Document Released: 04/06/2005 Document Revised: 09/12/2015 Document Reviewed: 09/14/2013 Elsevier Interactive Patient Education  [employer]2018 Elsevier Inc.

## 2017-05-31 ENCOUNTER — Other Ambulatory Visit: Payer: Self-pay | Admitting: Nurse Practitioner

## 2017-05-31 DIAGNOSIS — J302 Other seasonal allergic rhinitis: Secondary | ICD-10-CM

## 2017-05-31 MED ORDER — FEXOFENADINE HCL 180 MG PO TABS: 180.0000 mg | ORAL_TABLET | Freq: Every day | ORAL | 3 refills | Status: DC

## 2017-05-31 NOTE — Telephone Encounter (Signed)
[facility] a [facility].  His call back [phone]

## 2017-06-14 ENCOUNTER — Encounter: Payer: Self-pay | Admitting: Nurse Practitioner

## 2017-07-02 ENCOUNTER — Encounter: Payer: Self-pay | Admitting: Nurse Practitioner

## 2017-07-02 ENCOUNTER — Ambulatory Visit: Payer: Medicaid Other | Admitting: Nurse Practitioner

## 2017-07-02 VITALS — BP 103/51 | HR 63 | Temp 98.3°F | Ht 64.0 in | Wt 229.1 lb

## 2017-07-02 DIAGNOSIS — I10 Essential (primary) hypertension: Secondary | ICD-10-CM | POA: Diagnosis not present

## 2017-07-02 DIAGNOSIS — E119 Type 2 diabetes mellitus without complications: Secondary | ICD-10-CM | POA: Diagnosis not present

## 2017-07-02 DIAGNOSIS — F4010 Social phobia, unspecified: Secondary | ICD-10-CM | POA: Diagnosis not present

## 2017-07-02 DIAGNOSIS — E7800 Pure hypercholesterolemia, unspecified: Secondary | ICD-10-CM | POA: Diagnosis not present

## 2017-07-02 LAB — POCT GLYCOSYLATED HEMOGLOBIN (HGB A1C): Hemoglobin A1C: 6.2

## 2017-07-02 MED ORDER — PANTOPRAZOLE SODIUM 20 MG PO TBEC: 20.0000 mg | DELAYED_RELEASE_TABLET | Freq: Every day | ORAL | 1 refills | Status: DC

## 2017-07-02 MED ORDER — HYDROXYZINE HCL 25 MG PO TABS: ORAL_TABLET | ORAL | 3 refills | Status: DC

## 2017-07-02 MED ORDER — FLUOXETINE HCL 20 MG PO CAPS: 20.0000 mg | ORAL_CAPSULE | Freq: Every day | ORAL | 1 refills | Status: DC

## 2017-07-02 MED ORDER — LISINOPRIL 20 MG PO TABS: 20.0000 mg | ORAL_TABLET | Freq: Every day | ORAL | 5 refills | Status: DC

## 2017-07-02 MED ORDER — ISOSORBIDE MONONITRATE ER 120 MG PO TB24: 120.0000 mg | ORAL_TABLET | Freq: Two times a day (BID) | ORAL | 1 refills | Status: DC

## 2017-07-02 NOTE — Progress Notes (Signed)
Subjective:    Patient ID: Calvin Ortiz, male    DOB: Dec 19, 1970, 47 y.o.   MRN: [id number]  Calvin Ortiz is a 47 y.o. male presenting on 07/02/2017 for Anxiety (depression) and Diabetes   HPI Diabetes Pt presents today for follow up of Type 2 diabetes mellitus. He is not checking CBG at home - Current diabetic medications include:   - He is not currently symptomatic.  - He denies polydipsia, polyphagia, polyuria, headaches, diaphoresis, shakiness, chills, pain, numbness or tingling in extremities and changes in vision.   - Clinical course has been unchanged. - He  reports no regular exercise routine. - His diet is moderate in salt, moderate in fat, and moderate in carbohydrates. - Weight trend: stable  PREVENTION: Eye exam current (within one year): yes Foot exam current (within one year): yes  Lipid/ASCVD risk reduction - on statin: Yes Kidney protection - on ace or arb: Yes Recent Labs    12/10/16 1902 07/02/17 1345  HGBA1C 6.7* 6.2     Anxiety and Depression Initial appointment with me for this problem was on 05/21/2017.  [facility] and had not been on any pharmacological treatment for anxiety or depression in at least 15 years.  Pt had declined pharmacotherapy.  Started pt on fluoxetine 20 mg once daily.  Pt had also been referred to [facility], but has not had appointment.    Pt reports feeling better with depression.  Fluoxetine is going well, but notes anxiety is getting worse.  Unable to rest when he gets home.  Difficulty to "unwind." - As far as grief for the recent death of his father, tearfulness is still present.  He still has some overwhelming grief without much improvement.  Depression screen [facility] 2/9 07/02/2017 05/21/2017  Decreased Interest 1 2  Down, Depressed, Hopeless 1 2  PHQ - 2 Score 2 4  Altered sleeping 2 2  Tired, decreased energy 1 3  Change in appetite 1 3  Feeling bad or failure about yourself   0 0  Trouble concentrating 1 2  Moving slowly or fidgety/restless 0 0  Suicidal thoughts 0 0  PHQ-9 Score 7 14  Difficult doing work/chores Somewhat difficult Somewhat difficult   GAD 7 : Generalized Anxiety Score 07/02/2017 05/21/2017  Nervous, Anxious, on Edge 3 3  Control/stop worrying 2 0  Worry too much - different things 2 0  Trouble relaxing 3 1  Restless 1 0  Easily annoyed or irritable 2 3  Afraid - awful might happen 0 0  Total GAD 7 Score 13 7  Anxiety Difficulty Somewhat difficult Somewhat difficult    Social History   Tobacco Use  . Smoking status: Current Every Day Smoker    Packs/day: 0.50    Years: 25.00    Pack years: 12.50    Types: Cigarettes  . Smokeless tobacco: Never Used  . Tobacco comment: Was quit for about 14 months before resuming (has started/stopped total of 5 times)  Substance Use Topics  . Alcohol use: No  . Drug use: No    Review of Systems Per HPI unless specifically indicated above     Objective:    BP (!) 103/51 (BP Location: Right Arm, Patient Position: Sitting, Cuff Size: Large)   Pulse 63   Temp 98.3 F (36.8 C) (Oral)   Ht 5\' 4"  (1.626 m)   Wt 229 lb 1.6 oz (103.9 kg)   BMI 39.32 kg/m  Wt Readings from Last 3 Encounters:  07/02/17 229 lb 1.6 oz (103.9 kg)  05/21/17 240 lb 3.2 oz (109 kg)  12/17/16 244 lb 3.2 oz (110.8 kg)    Physical Exam  Constitutional: He is oriented to person, place, and time. He appears well-developed and well-nourished. No distress.  HENT:  Head: Normocephalic and atraumatic.  Neck: Normal range of motion. Neck supple. Carotid bruit is not present.  Cardiovascular: Normal rate, regular rhythm, S1 normal, S2 normal, normal heart sounds and intact distal pulses.  Pulmonary/Chest: Effort normal and breath sounds normal. No respiratory distress.  Abdominal: Soft. Bowel sounds are normal. He exhibits no distension. There is no hepatosplenomegaly. There is no tenderness. No hernia.  Musculoskeletal:  He exhibits no edema (pedal).  Neurological: He is alert and oriented to person, place, and time.  Skin: Skin is warm and dry.  Psychiatric: He has a normal mood and affect. His speech is normal and behavior is normal.  Vitals reviewed.    Diabetic Foot Exam - Simple   Simple Foot Form Diabetic Foot exam was performed with the following findings:  Yes 07/02/2017  3:52 PM  Visual Inspection No deformities, no ulcerations, no other skin breakdown bilaterally:  Yes Sensation Testing Intact to touch and monofilament testing bilaterally:  Yes Pulse Check Posterior Tibialis and Dorsalis pulse intact bilaterally:  Yes Comments      Results for orders placed or performed in visit on 07/02/17  POCT glycosylated hemoglobin (Hb A1C)  Result Value Ref Range   Hemoglobin A1C 6.2       Assessment & Plan:   Problem List Items Addressed This Visit      Cardiovascular and Mediastinum   Hypertension Well-controlled hypertension.  BP goal < 130/80.  Pt is slowly working on lifestyle modifications.  Taking medications tolerating well without side effects. No currently identified complications.  Plan: 1. Continue taking medications.  Change lisinopril from 40 mg to 20 mg once daily.  Refills provided. 2. Obtain labs  lipid panel and CMP  3. Encouraged heart healthy diet and increasing exercise to 30 minutes most days of the week. 4. Check BP 1-2 x per week at home, keep log, and bring to clinic at next appointment. 5. Follow up 3 months.     Relevant Medications   isosorbide mononitrate (IMDUR) 120 MG 24 hr tablet   lisinopril (PRINIVIL,ZESTRIL) 20 MG tablet   Other Relevant Orders   Lipid panel   COMPLETE METABOLIC PANEL WITH GFR     Endocrine   Diabetes mellitus without complication (HCC) - Primary Well-controlledDM with A1c 6.2% and goal A1c < 7.0%. - Complications - Hypertriglyceridemia.  Plan:  1. Continue current therapy: Metformin 2. Encourage improved lifestyle: - low  carb/low glycemic diet reinforced prior education - Increase physical activity to 30 minutes most days of the week.  Explained that increased physical activity increases body's use of sugar for energy. 3. Check fasting am CBG.  Bring log to next visit for review 4. Continue ASA, ACEi and Statin 5. DM Foot exam done today with Normal findings.   and Advised to schedule DM ophtho exam, send record. 6. Follow-up 3 months   Relevant Medications   hydrOXYzine (ATARAX/VISTARIL) 25 MG tablet   lisinopril (PRINIVIL,ZESTRIL) 20 MG tablet   Other Relevant Orders   POCT glycosylated hemoglobin (Hb A1C) (Completed)   Lipid panel   COMPLETE METABOLIC PANEL WITH GFR     Other   Hypercholesterolemia (Chronic) chronic hyperlipidemia with elevated triglycerides.  Patient  currently taking fenofibrate and atorvastatin tolerating well without side effects.  No past issues with rhabdomyolysis.  Plan: 1.  Continue fenofibrate 145 mg once daily.  Continue atorvastatin 10 mg once daily. 2.  No recent lipid check.  Labs today include lipid panel and CMP. 3.  Follow-up 3 months.   Relevant Medications   isosorbide mononitrate (IMDUR) 120 MG 24 hr tablet   lisinopril (PRINIVIL,ZESTRIL) 20 MG tablet   Other Relevant Orders   Lipid panel   COMPLETE METABOLIC PANEL WITH GFR    Other Visit Diagnoses    Social anxiety disorder     Worsening anxiety with improved control of depression.  Patient now has more trouble with public spaces and interacting with people.  Plan: 1.  Increase fluoxetine to 20 mg once daily. 2.  Start hydroxyzine 2 tablets at bedtime for sleep may take half to 1 tablet 2 times daily as needed for anxiety. 3.  Follow-up on referral to LCSW.  Encourage patient to continue moving forward with therapy.  Provided self referral options for psychiatry and therapy. 4.  Follow-up 3 months   Relevant Medications   hydrOXYzine (ATARAX/VISTARIL) 25 MG tablet   FLUoxetine (PROZAC) 20 MG capsule       Meds ordered this encounter  Medications  . hydrOXYzine (ATARAX/VISTARIL) 25 MG tablet    Sig: Take 2 tablets (50 mg total) by mouth at bedtime. May also take 0.5-1 tablets (12.5-25 mg total) 2 (two) times daily as needed for anxiety.    Dispense:  120 tablet    Refill:  3    Order Specific Question:   Supervising Provider    Answer:   Smitty CordsKARAMALEGOS, ALEXANDER J [[id number]]  . isosorbide mononitrate (IMDUR) 120 MG 24 hr tablet    Sig: Take 1 tablet (120 mg total) by mouth 2 (two) times daily.    Dispense:  90 tablet    Refill:  1    Order Specific Question:   Supervising Provider    Answer:   Smitty CordsKARAMALEGOS, ALEXANDER J [[id number]]  . FLUoxetine (PROZAC) 20 MG capsule    Sig: Take 1 capsule (20 mg total) by mouth daily.    Dispense:  90 capsule    Refill:  1    Order Specific Question:   Supervising Provider    Answer:   Smitty CordsKARAMALEGOS, ALEXANDER J [[id number]]  . pantoprazole (PROTONIX) 20 MG tablet    Sig: Take 1 tablet (20 mg total) by mouth daily.    Dispense:  90 tablet    Refill:  1    Order Specific Question:   Supervising Provider    Answer:   Smitty CordsKARAMALEGOS, ALEXANDER J [[id number]]  . lisinopril (PRINIVIL,ZESTRIL) 20 MG tablet    Sig: Take 1 tablet (20 mg total) by mouth daily.    Dispense:  30 tablet    Refill:  5    Order Specific Question:   Supervising Provider    Answer:   Smitty CordsKARAMALEGOS, ALEXANDER J [[id number]]      Follow up plan: Return in about 3 months (around 10/02/2017) for Diabetes, hypertension, anxiety.  Wilhelmina McardleLauren Katlyne Nishida, DNP, AGPCNP-BC Adult Gerontology Primary Care Nurse Practitioner [facility] 07/17/2017, 3:52 PM

## 2017-07-02 NOTE — Patient Instructions (Addendum)
Calvin Ortiz, Thank you for coming in to clinic today.  1. Self referral for psychiatry/counseling: [facility](Behavioral Health) [place] [address], [place], [place] [address] Phone: [phone]) [phone]  [facility], available walk-in 9am-4pm M-F [address] [place], [place] [address] Hours: 9am - 4pm (M-F, walk in available) [phone] ----------------------------------------------------------------- [facility]  Self Referral: 1. [facility]   Address: [address], [place], [place] [address] Hours: Open today  9AM-7PM Phone: [phone]  2. Anell Barr[facility]yl [facility]  - [facility] Address: [address], [place], [place] [address] Phone: [phone]) [phone]   2. Your provider would like to you have your annual eye exam in July 2019. Please contact your current eye doctor or here are some good options for you to contact.   [facility]   Address: [address], [place], [place] [address] Phone: [phone]) [phone]  Website: [email or web address]   [facility] [address], [place], [place] [address] Phone: [phone] [email or web address]  [facility]  Address: [address], [place], [place] [address] Phone: [phone]   [facility] [address], [place] [address] Phone: [phone]) [phone]  [facility] Address: [address], [place], [place] [address]  Phone: [phone]   Please schedule a follow-up appointment with Calvin Ortiz, Calvin Ortiz. Return in about 3 months (around 10/02/2017) for Diabetes, hypertension, anxiety.  If you have any other questions or concerns, please feel free to call the clinic or send a message through MyChart. You may also schedule an earlier appointment if necessary.  You will receive a survey after today's visit either digitally by e-mail or paper by [employer] mail. Your experiences and feedback matter to us.  Please respond so we know  how we are doing as we provide care for you.   Calvin McardleLauren Nikolas Casher, DNP, Calvin Ortiz-BC Adult Gerontology Nurse Practitioner [facility], [facility]

## 2017-07-17 ENCOUNTER — Encounter: Payer: Self-pay | Admitting: Nurse Practitioner

## 2017-08-16 ENCOUNTER — Other Ambulatory Visit: Payer: Self-pay | Admitting: Nurse Practitioner

## 2017-08-16 DIAGNOSIS — F4010 Social phobia, unspecified: Secondary | ICD-10-CM

## 2017-09-16 ENCOUNTER — Other Ambulatory Visit: Payer: Self-pay | Admitting: Nurse Practitioner

## 2017-09-16 DIAGNOSIS — I10 Essential (primary) hypertension: Secondary | ICD-10-CM

## 2017-10-11 ENCOUNTER — Other Ambulatory Visit: Payer: Self-pay

## 2017-10-11 ENCOUNTER — Encounter: Payer: Self-pay | Admitting: Nurse Practitioner

## 2017-10-11 ENCOUNTER — Ambulatory Visit: Payer: Medicaid Other | Admitting: Nurse Practitioner

## 2017-10-11 VITALS — BP 109/45 | HR 60 | Temp 97.9°F | Ht 64.0 in | Wt 230.2 lb

## 2017-10-11 DIAGNOSIS — E119 Type 2 diabetes mellitus without complications: Secondary | ICD-10-CM

## 2017-10-11 DIAGNOSIS — K219 Gastro-esophageal reflux disease without esophagitis: Secondary | ICD-10-CM

## 2017-10-11 DIAGNOSIS — F419 Anxiety disorder, unspecified: Secondary | ICD-10-CM | POA: Diagnosis not present

## 2017-10-11 DIAGNOSIS — I10 Essential (primary) hypertension: Secondary | ICD-10-CM

## 2017-10-11 DIAGNOSIS — Z716 Tobacco abuse counseling: Secondary | ICD-10-CM

## 2017-10-11 DIAGNOSIS — E7849 Other hyperlipidemia: Secondary | ICD-10-CM

## 2017-10-11 DIAGNOSIS — F4010 Social phobia, unspecified: Secondary | ICD-10-CM

## 2017-10-11 DIAGNOSIS — I252 Old myocardial infarction: Secondary | ICD-10-CM

## 2017-10-11 LAB — POCT GLYCOSYLATED HEMOGLOBIN (HGB A1C): Hemoglobin A1C: 6.2 % — AB (ref 4.0–5.6)

## 2017-10-11 MED ORDER — ATORVASTATIN CALCIUM 10 MG PO TABS: 10.0000 mg | ORAL_TABLET | Freq: Every day | ORAL | 2 refills | Status: DC

## 2017-10-11 MED ORDER — VARENICLINE TARTRATE 0.5 MG X 11 & 1 MG X 42 PO MISC: ORAL | 0 refills | Status: DC

## 2017-10-11 MED ORDER — HYDROCHLOROTHIAZIDE 12.5 MG PO TABS: 12.5000 mg | ORAL_TABLET | Freq: Every day | ORAL | 3 refills | Status: DC

## 2017-10-11 MED ORDER — FLUOXETINE HCL 20 MG PO CAPS: ORAL_CAPSULE | ORAL | 1 refills | Status: DC

## 2017-10-11 MED ORDER — PANTOPRAZOLE SODIUM 20 MG PO TBEC: 20.0000 mg | DELAYED_RELEASE_TABLET | Freq: Every day | ORAL | 1 refills | Status: DC

## 2017-10-11 MED ORDER — METFORMIN HCL 500 MG PO TABS: ORAL_TABLET | ORAL | 3 refills | Status: DC

## 2017-10-11 MED ORDER — AMLODIPINE BESYLATE 10 MG PO TABS: 10.0000 mg | ORAL_TABLET | Freq: Every day | ORAL | 1 refills | Status: DC

## 2017-10-11 MED ORDER — CARVEDILOL 25 MG PO TABS: 25.0000 mg | ORAL_TABLET | Freq: Two times a day (BID) | ORAL | 3 refills | Status: DC

## 2017-10-11 MED ORDER — FENOFIBRATE 145 MG PO TABS: 145.0000 mg | ORAL_TABLET | Freq: Every day | ORAL | 1 refills | Status: DC

## 2017-10-11 NOTE — Progress Notes (Signed)
Subjective:    Patient ID: Calvin Ortiz, male    DOB: 05/03/1970, 47 y.o.   MRN: [id number]  Calvin Ortiz is a 47 y.o. male presenting on 10/11/2017 for Diabetes and Anxiety   HPI Diabetes Pt presents today for follow up of Type 2 diabetes mellitus. He is not checking CBG at home. He has no glucometer. - Current diabetic medications include: metformin - He is not currently symptomatic.  - He denies polydipsia, polyphagia, polyuria, headaches, diaphoresis, shakiness, chills, pain, numbness or tingling in extremities and changes in vision.   - Clinical course has been stable. - He  reports no regular exercise routine, but is active at home throughout the day. - His diet is moderate in salt, moderate in fat, and moderate in carbohydrates. - Weight trend: stable  PREVENTION: Eye exam current (within one year): yes Foot exam current (within one year): yes Lipid/ASCVD risk reduction - on statin: yes Kidney protection - on ace or arb: yes  Recent Labs    12/10/16 1902 07/02/17 1345  HGBA1C 6.7* 6.2   Anxiety  Significantly improved daytime panic with hydroxyzine 1 tab once daily.  Patient still has regular anxiety, largely driven by life responsibilities of work, caring for his ailing mother, and grief.  Grief response is improving.  Patient still notes irritability.  Has started smoking again in response to stress.  Is ready to stop smoking.  GAD 7 : Generalized Anxiety Score 10/11/2017 10/11/2017 07/02/2017 05/21/2017  Nervous, Anxious, on Edge 1 1 3 3   Control/stop worrying 0 0 2 0  Worry too much - different things 0 0 2 0  Trouble relaxing 0 0 3 1  Restless 0 0 1 0  Easily annoyed or irritable 1 1 2 3   Afraid - awful might happen 0 0 0 0  Total GAD 7 Score 2 2 13 7   Anxiety Difficulty Not difficult at all Not difficult at all Somewhat difficult Somewhat difficult     Depression screen [facility] 10/11/2017 10/11/2017 07/02/2017 05/21/2017  Decreased Interest 1 1 1 2     Down, Depressed, Hopeless 0 0 1 2  PHQ - 2 Score 1 1 2 4   Altered sleeping 1 1 2 2   Tired, decreased energy 1 1 1 3   Change in appetite 1 1 1 3   Feeling bad or failure about yourself  0 0 0 0  Trouble concentrating 0 0 1 2  Moving slowly or fidgety/restless 0 0 0 0  Suicidal thoughts 0 0 0 0  PHQ-9 Score 4 4 7 14   Difficult doing work/chores Not difficult at all Not difficult at all Somewhat difficult Somewhat difficult    Social History   Tobacco Use  . Smoking status: Current Every Day Smoker    Packs/day: 0.50    Years: 25.00    Pack years: 12.50    Types: Cigarettes  . Smokeless tobacco: Never Used  . Tobacco comment: Was quit for about 14 months before resuming (has started/stopped total of 5 times)  Substance Use Topics  . Alcohol use: No  . Drug use: No    Review of Systems Per HPI unless specifically indicated above     Objective:    BP (!) 109/45 (BP Location: Right Arm, Patient Position: Sitting, Cuff Size: Normal)   Pulse 60   Temp 97.9 F (36.6 C) (Oral)   Ht 5\' 4"  (1.626 m)   Wt 230 lb 3.2 oz (104.4 kg)   BMI 39.51 kg/m  Wt Readings from Last 3 Encounters:  10/11/17 230 lb 3.2 oz (104.4 kg)  07/02/17 229 lb 1.6 oz (103.9 kg)  05/21/17 240 lb 3.2 oz (109 kg)    Physical Exam  Constitutional: He is oriented to person, place, and time. He appears well-developed and well-nourished. No distress.  HENT:  Head: Normocephalic and atraumatic.  Cardiovascular: Normal rate, regular rhythm, S1 normal, S2 normal, normal heart sounds and intact distal pulses.  Pulmonary/Chest: Effort normal and breath sounds normal. No respiratory distress.  Neurological: He is alert and oriented to person, place, and time.  Skin: Skin is warm and dry. Capillary refill takes less than 2 seconds.  Psychiatric: He has a normal mood and affect. His speech is normal and behavior is normal. Judgment and thought content normal. Cognition and memory are normal.  Vitals  reviewed.  Results for orders placed or performed in visit on 07/02/17  POCT glycosylated hemoglobin (Hb A1C)  Result Value Ref Range   Hemoglobin A1C 6.2       Assessment & Plan:   Problem List Items Addressed This Visit      Cardiovascular and Mediastinum   Hypertension Stable.  Patient requests refills.  Refills provided.  Followup 3 months.   Relevant Medications   amLODipine (NORVASC) 10 MG tablet   carvedilol (COREG) 25 MG tablet   fenofibrate (TRICOR) 145 MG tablet   hydrochlorothiazide (HYDRODIURIL) 12.5 MG tablet     Endocrine   Diabetes mellitus without complication (HCC) UncontrolledDM with A1c at goal A1c < 7.0%. - Complications - peripheral neuropathy and hyperglycemia.  Plan:  1. Continue current therapy without changes 2. Encourage improved lifestyle: - low carb/low glycemic diet reinforced prior education - Increase physical activity to 30 minutes most days of the week.  Explained that increased physical activity increases body's use of sugar for energy. 3. Check fasting am CBG and bring log to next visit for review 4. Continue ASA, ACEi and Statin 5. Recommended pt to keep eye and foot exams up to date. 6. Follow-up 3 mos    Relevant Medications   metFORMIN (GLUCOPHAGE) 500 MG tablet   Other Relevant Orders   COMPLETE METABOLIC PANEL WITH GFR (Completed)   Lipid panel (Completed)   POCT glycosylated hemoglobin (Hb A1C) (Completed)     Other   Other hyperlipidemia Stable today on exam.  Medications tolerated without side effects.  Continue at current doses.  Refills provided.  Check labs today. Followup 3 months.    Relevant Medications   amLODipine (NORVASC) 10 MG tablet   carvedilol (COREG) 25 MG tablet   fenofibrate (TRICOR) 145 MG tablet   hydrochlorothiazide (HYDRODIURIL) 12.5 MG tablet   Other Relevant Orders   COMPLETE METABOLIC PANEL WITH GFR (Completed)   Lipid panel (Completed)   TSH (Completed)   History of MI (myocardial  infarction) No new ASCVD events. Continue beta blocker.  Followup prn with Cardiology.   Relevant Medications   carvedilol (COREG) 25 MG tablet    Other Visit Diagnoses    Encounter for smoking cessation counseling    -  Primary Patient ready to quit smoking.  Is prepared to quit as his stressors have improved.  Has quit in past with Chantix and would like to start again.    1. 7 days prior to quit date, START Chantix starting pack.  Take 0.5 mg tablet once daily w/ food for 3 days.  Take 0.5 mg tablet twice daily w/ food for 3 days.  Then, take 1 mg  tablet twice daily and continue for total treatment w/ Chantix of 4-12 weeks.  Stop smoking or reduce by half on quit date. - Mutually set quit date: 10/21/2017 - START date of Chantix: 10/14/2017 - Reviewed side effects of nausea, vivid dreams, depression, possible SI. 2. Encouraged patient to have a plan to use deep breathing strategies, candies, gums when he has cravings to smoke. 3. Followup 4 weeks  Discussion today >5 minutes (<10 minutes) specifically on counseling on risks of tobacco use, complications, treatment, smoking cessation as above.     Anxiety     Stable today on exam. Denies SI/HI and has no plans to carry out if SI/HI arise.  Medications tolerated without side effects.  Continue at current doses.  Refills provided.  Encouraged ongoing non-pharm management of stress. Followup 3 months.      Relevant Medications   FLUoxetine (PROZAC) 20 MG capsule   Social anxiety disorder     See Above for anxiety   Relevant Medications   FLUoxetine (PROZAC) 20 MG capsule   Gastroesophageal reflux disease, esophagitis presence not specified   Patient stable per report.  Needs refills.  No reported side effects to medications.  Refills provided.  Followup 3 months.   Relevant Medications   pantoprazole (PROTONIX) 20 MG tablet      Meds ordered this encounter  Medications  . varenicline (CHANTIX STARTING MONTH PAK) 0.5 MG X 11 & 1 MG X 42  tablet    Sig: Take one 0.5 mg tab by mouth once daily for 3 days, then take one 0.5 mg tab twice daily for 4 days, then take one 1 mg tab twice daily.    Dispense:  53 tablet    Refill:  0    Order Specific Question:   Supervising Provider    Answer:   Smitty Cords [[id number]]  . atorvastatin (LIPITOR) 10 MG tablet    Sig: Take 1 tablet (10 mg total) by mouth daily.    Dispense:  90 tablet    Refill:  2    Order Specific Question:   Supervising Provider    Answer:   Smitty Cords [[id number]]  . amLODipine (NORVASC) 10 MG tablet    Sig: Take 1 tablet (10 mg total) by mouth daily.    Dispense:  90 tablet    Refill:  1    Order Specific Question:   Supervising Provider    Answer:   Smitty Cords [[id number]]  . carvedilol (COREG) 25 MG tablet    Sig: Take 1 tablet (25 mg total) by mouth 2 (two) times daily with a meal.    Dispense:  60 tablet    Refill:  3    Order Specific Question:   Supervising Provider    Answer:   Smitty Cords [[id number]]  . fenofibrate (TRICOR) 145 MG tablet    Sig: Take 1 tablet (145 mg total) by mouth daily.    Dispense:  90 tablet    Refill:  1    Order Specific Question:   Supervising Provider    Answer:   Smitty Cords [[id number]]  . FLUoxetine (PROZAC) 20 MG capsule    Sig: TAKE 1 CAPSULE(20 MG) BY MOUTH DAILY    Dispense:  90 capsule    Refill:  1    Order Specific Question:   Supervising Provider    Answer:   Smitty Cords [[id number]]  . hydrochlorothiazide (HYDRODIURIL) 12.5 MG tablet    Sig: Take 1  tablet (12.5 mg total) by mouth daily.    Dispense:  90 tablet    Refill:  3    Order Specific Question:   Supervising Provider    Answer:   Smitty Cords [[id number]]  . metFORMIN (GLUCOPHAGE) 500 MG tablet    Sig: TAKE ONE TABLET BY MOUTH EVERY DAY WITH BREAKFAST    Dispense:  90 tablet    Refill:  3    Order Specific Question:   Supervising Provider    Answer:   Smitty Cords [[id number]]  .  pantoprazole (PROTONIX) 20 MG tablet    Sig: Take 1 tablet (20 mg total) by mouth daily.    Dispense:  90 tablet    Refill:  1    Order Specific Question:   Supervising Provider    Answer:   Smitty Cords [[id number]]    Follow up plan: Return in about 1 month (around 11/08/2017) for smoking cessation AND 3 months for diabetes.  [name], DNP, AGPCNP-BC Adult Gerontology Primary Care Nurse Practitioner [facility] 10/11/2017, 1:39 PM

## 2017-10-11 NOTE — Patient Instructions (Addendum)
Calvin Ortiz,   Thank you for coming in to clinic today.  1. For smoking cessation, we are going to start chantix.   Start varenicline (Chantix) 7 days BEFORE your quit date.    Your quit date: 10/21/2017      START Chantix on: 10/14/2017  - Day 1-3: Take one 0.5mg  tablet once daily WITH FOOD - Days 4-7: increase to one 0.5mg  tablet twice per day.  - Days 7- 12 weeks max: Increase to one 1mg  tablet twice per day for up to 12 weeks  To quit smoking:  - Only start this treatment if you are mentally ready to quit. - Start Chantix 1 week before your quit date.  If unable to quit completely in 7 days, then work to reduce by 50% or more by 4 weeks, then another 50% reduction in 4 more weeks, and lastly quit after a final 4 weeks.  - We can continue Chantix for up to 12 weeks total if needed for maintenance. - Common side effects include nausea (take with food), headaches, insomnia, vivid dreams, mood instability, seizure, and a rare risk of suicidal ideation.  If you have any serious agitation or acute depression with severe mood changes you need to stop taking the medicine immediately   2. Continue all other medications without changes. - Work to reduce hydroxyzine to only as needed.   Please schedule a follow-up appointment with Calvin Ortiz, AGNP. Return in about 1 month (around 11/08/2017) for smoking cessation AND 3 months for diabetes.  If you have any other questions or concerns, please feel free to call the clinic or send a message through MyChart. You may also schedule an earlier appointment if necessary.  You will receive a survey after today's visit either digitally by e-mail or paper by [employer] mail. Your experiences and feedback matter to us.  Please respond so we know how we are doing as we provide care for you.   Calvin McardleLauren Kenza Munar, DNP, AGNP-BC Adult Gerontology Nurse Practitioner [facility] [place] [facility], [facility]

## 2017-10-13 ENCOUNTER — Other Ambulatory Visit: Payer: Medicaid Other

## 2017-10-14 LAB — LIPID PANEL
Cholesterol: 136 mg/dL (ref <200)
HDL: 24 mg/dL — ABNORMAL LOW (ref >40)
LDL Cholesterol (Calc): 76 mg/dL (calc)
Non-HDL Cholesterol (Calc): 112 mg/dL (calc) (ref <130)
Total CHOL/HDL Ratio: 5.7 (calc) — ABNORMAL HIGH (ref <5.0)
Triglycerides: 302 mg/dL — ABNORMAL HIGH (ref <150)

## 2017-10-14 LAB — COMPLETE METABOLIC PANEL WITH GFR
AG Ratio: 1.5 (calc) (ref 1.0–2.5)
ALT: 17 U/L (ref 9–46)
AST: 13 U/L (ref 10–40)
Albumin: 3.8 g/dL (ref 3.6–5.1)
Alkaline phosphatase (APISO): 58 U/L (ref 40–115)
BUN: 15 mg/dL (ref 7–25)
CO2: 28 mmol/L (ref 20–32)
Calcium: 9.4 mg/dL (ref 8.6–10.3)
Chloride: 102 mmol/L (ref 98–110)
Creat: 1.11 mg/dL (ref 0.60–1.35)
GFR, Est African American: 92 mL/min/{1.73_m2} (ref >=60)
GFR, Est Non African American: 80 mL/min/{1.73_m2} (ref >=60)
Globulin: 2.5 g/dL (calc) (ref 1.9–3.7)
Glucose, Bld: 152 mg/dL — ABNORMAL HIGH (ref 65–99)
Potassium: 3.7 mmol/L (ref 3.5–5.3)
Sodium: 137 mmol/L (ref 135–146)
Total Bilirubin: 0.4 mg/dL (ref 0.2–1.2)
Total Protein: 6.3 g/dL (ref 6.1–8.1)

## 2017-10-14 LAB — TSH: TSH: 2.49 mIU/L (ref 0.40–4.50)

## 2017-10-16 ENCOUNTER — Other Ambulatory Visit: Payer: Self-pay | Admitting: Nurse Practitioner

## 2017-10-16 DIAGNOSIS — I10 Essential (primary) hypertension: Secondary | ICD-10-CM

## 2017-10-20 ENCOUNTER — Other Ambulatory Visit: Payer: Self-pay | Admitting: Nurse Practitioner

## 2017-10-20 DIAGNOSIS — E7849 Other hyperlipidemia: Secondary | ICD-10-CM

## 2017-10-20 MED ORDER — ATORVASTATIN CALCIUM 20 MG PO TABS: 20.0000 mg | ORAL_TABLET | Freq: Every day | ORAL | 1 refills | Status: DC

## 2017-10-28 ENCOUNTER — Ambulatory Visit: Payer: Self-pay | Admitting: Ophthalmology

## 2017-11-09 ENCOUNTER — Other Ambulatory Visit: Payer: Self-pay

## 2017-11-09 ENCOUNTER — Encounter: Payer: Self-pay | Admitting: Nurse Practitioner

## 2017-11-09 ENCOUNTER — Other Ambulatory Visit: Payer: Self-pay | Admitting: Nurse Practitioner

## 2017-11-09 ENCOUNTER — Ambulatory Visit: Payer: Medicaid Other | Admitting: Nurse Practitioner

## 2017-11-09 VITALS — BP 107/45 | HR 59 | Temp 98.2°F | Ht 64.0 in | Wt 236.6 lb

## 2017-11-09 DIAGNOSIS — M545 Low back pain, unspecified: Secondary | ICD-10-CM

## 2017-11-09 DIAGNOSIS — Z716 Tobacco abuse counseling: Secondary | ICD-10-CM | POA: Diagnosis not present

## 2017-11-09 MED ORDER — BACLOFEN 10 MG PO TABS: 5.0000 mg | ORAL_TABLET | Freq: Every evening | ORAL | 1 refills | Status: DC | PRN

## 2017-11-09 MED ORDER — VARENICLINE TARTRATE 1 MG PO TABS: 1.0000 mg | ORAL_TABLET | Freq: Two times a day (BID) | ORAL | 1 refills | Status: DC

## 2017-11-09 NOTE — Progress Notes (Signed)
Subjective:    Patient ID: Calvin Ortiz, male    DOB: 08/27/1970, 47 y.o.   MRN: [id number]  Calvin Ortiz is a 47 y.o. male presenting on 11/09/2017 for Nicotine Dependence   HPI Smoking Cessation Quit July 5th.  He is still having cravings, but is sucking air in through a straw to help curb these cravings.  He is noticing food tastes better, he smells cigarette smoke in his home and in his clothes now that he previously was not able to do. - Relapsing: Has "bummed" cigarettes over last week and a half x 4 cigarettes.  Back pain is worsening and is leading to more smoking.    Low back pain Has had 2 back surgeries.  Worse with weather.  Is hurting in am and pm more than during day.  Relief is sitting with legs elevated.  Is taking 800 mg every 6-8 hours.  This is only providing minimal to moderate relief. Pain radiates out to sides of back.  Bending the wrong way started his current pain.  - He has had PT in past.  Continues to intermittently do his exercise. - Is not currently using heat or ice.   Social History   Tobacco Use  . Smoking status: Former Smoker    Packs/day: 0.00    Years: 25.00    Pack years: 0.00    Types: Cigarettes    Last attempt to quit: 10/22/2017    Years since quitting: 0.0  . Smokeless tobacco: Never Used  . Tobacco comment: Was quit for about 14 months before resuming (has started/stopped total of 5 times)  Substance Use Topics  . Alcohol use: No  . Drug use: No    Review of Systems Per HPI unless specifically indicated above     Objective:    BP (!) 107/45 (BP Location: Right Arm, Patient Position: Sitting, Cuff Size: Normal)   Pulse (!) 59   Temp 98.2 F (36.8 C) (Oral)   Ht 5\' 4"  (1.626 m)   Wt 236 lb 9.6 oz (107.3 kg)   BMI 40.61 kg/m   Wt Readings from Last 3 Encounters:  11/09/17 236 lb 9.6 oz (107.3 kg)  10/11/17 230 lb 3.2 oz (104.4 kg)  07/02/17 229 lb 1.6 oz (103.9 kg)    Physical Exam  Constitutional: He is  oriented to person, place, and time. He appears well-developed and well-nourished. No distress.  HENT:  Head: Normocephalic and atraumatic.  Cardiovascular: Normal rate, regular rhythm, S1 normal, S2 normal, normal heart sounds and intact distal pulses.  Pulmonary/Chest: Effort normal. No accessory muscle usage. No respiratory distress. He has no decreased breath sounds. He has wheezes in the left lower field. He has no rhonchi. He has no rales.  Musculoskeletal:  Low Back Inspection: Normal appearance, Large body habitus, no spinal deformity, symmetrical. Palpation: No tenderness over spinous processes. Bilateral lumbar paraspinal muscles tender and with hypertonicity/spasm. ROM: Full active ROM forward flex / back extension, rotation L/R without discomfort Special Testing: Seated SLR negative for radicular pain bilaterally.   Strength: Bilateral hip flex/ext 5/5, knee flex/ext 5/5, ankle dorsiflex/plantarflex 5/5 Neurovascular: intact distal sensation to light touch   Neurological: He is alert and oriented to person, place, and time.  Skin: Skin is warm and dry. Capillary refill takes less than 2 seconds.  Psychiatric: He has a normal mood and affect. His behavior is normal.  Vitals reviewed.    Results for orders placed or performed in visit on 10/11/17  COMPLETE METABOLIC PANEL WITH GFR  Result Value Ref Range   Glucose, Bld 152 (H) 65 - 99 mg/dL   BUN 15 7 - 25 mg/dL   Creat 1.61 0.96 - 0.45 mg/dL   GFR, Est Non African American 80 > OR = 60 mL/min/1.58m2   GFR, Est African American 92 > OR = 60 mL/min/1.68m2   BUN/Creatinine Ratio NOT APPLICABLE 6 - 22 (calc)   Sodium 137 135 - 146 mmol/L   Potassium 3.7 3.5 - 5.3 mmol/L   Chloride 102 98 - 110 mmol/L   CO2 28 20 - 32 mmol/L   Calcium 9.4 8.6 - 10.3 mg/dL   Total Protein 6.3 6.1 - 8.1 g/dL   Albumin 3.8 3.6 - 5.1 g/dL   Globulin 2.5 1.9 - 3.7 g/dL (calc)   AG Ratio 1.5 1.0 - 2.5 (calc)   Total Bilirubin 0.4 0.2 - 1.2 mg/dL     Alkaline phosphatase (APISO) 58 40 - 115 U/L   AST 13 10 - 40 U/L   ALT 17 9 - 46 U/L  Lipid panel  Result Value Ref Range   Cholesterol 136 <200 mg/dL   HDL 24 (L) >40 mg/dL   Triglycerides 981 (H) <150 mg/dL   LDL Cholesterol (Calc) 76 mg/dL (calc)   Total CHOL/HDL Ratio 5.7 (H) <5.0 (calc)   Non-HDL Cholesterol (Calc) 112 <130 mg/dL (calc)  TSH  Result Value Ref Range   TSH 2.49 0.40 - 4.50 mIU/L  POCT glycosylated hemoglobin (Hb A1C)  Result Value Ref Range   Hemoglobin A1C 6.2 (A) 4.0 - 5.6 %   HbA1c POC (<> result, manual entry)  4.0 - 5.6 %   HbA1c, POC (prediabetic range)  5.7 - 6.4 %   HbA1c, POC (controlled diabetic range)  0.0 - 7.0 %      Assessment & Plan:   Problem List Items Addressed This Visit    None    Visit Diagnoses    Encounter for smoking cessation counseling    -  Primary   Relevant Medications   varenicline (CHANTIX CONTINUING MONTH PAK) 1 MG tablet   Acute bilateral low back pain without sciatica       Relevant Medications   baclofen (LIORESAL) 10 MG tablet    # smoking cessation: Improving - Quit 10/22/2017 with several cigarettes in last 10 days as stress relief response to increased back pain.  Continues to take Chantix without side effects and using straw for curbing cravings.  Plan: - Continue Chantix 1 mg bid. - Continue working to resolve problems that cause cravings.  Treat pain prior to smoking.   - Followup 2 months.   # Lumbar back pain Pain likely self-limited.  Muscle strain possible complicated by reinjury of chronic back pain s/p spinal surgeries.  Plan:  1. Treat with OTC pain meds (acetaminophen and ibuprofen).  Discussed alternate dosing and max dosing. 2. Apply heat and/or ice to affected area. 3. May also apply a muscle rub with lidocaine or lidocaine patch after heat or ice. 4. Take muscle relaxer baclofen 10 mg qhs prn.  Cautioned drowsiness. 5. Follow up 7-10 days prn.  Consider future return to PT.      Meds  ordered this encounter  Medications  . varenicline (CHANTIX CONTINUING MONTH PAK) 1 MG tablet    Sig: Take 1 tablet (1 mg total) by mouth 2 (two) times daily.    Dispense:  60 tablet    Refill:  1    Order Specific  Question:   Supervising Provider    Answer:   Smitty CordsKARAMALEGOS, ALEXANDER J [[id number]]  . baclofen (LIORESAL) 10 MG tablet    Sig: Take 0.5-1 tablets (5-10 mg total) by mouth at bedtime as needed for muscle spasms.    Dispense:  30 each    Refill:  1    Order Specific Question:   Supervising Provider    Answer:   Smitty CordsKARAMALEGOS, ALEXANDER J [[id number]]    Follow up plan: Return in about 2 months (around 01/10/2018) for smoking cessation.  Wilhelmina McardleLauren Treyana Sturgell, DNP, AGPCNP-BC Adult Gerontology Primary Care Nurse Practitioner [facility] 11/09/2017, 1:38 PM

## 2017-11-09 NOTE — Patient Instructions (Addendum)
Calvin Ortiz,   Thank you for coming in to clinic today.  1. You have lumbar back muscle strain. Likely caused by reinjury. - Start taking Tylenol extra strength 1 to 2 tablets every 6-8 hours for aches or fever/chills for next few days as needed.  Do not take more than 3,000 mg in 24 hours from all medicines.  May take Ibuprofen as well if tolerated 200-400mg  every 8 hours as needed. May alternate tylenol and ibuprofen in same day. - Use heat and ice.  Apply this for 15 minutes at a time 6-8 times per day.   - Muscle rub with lidocaine, lidocaine patch, Biofreeze, or tiger balm for topical pain relief.  Avoid using this with heat and ice to avoid burns. - START muscle relaxer baclofen 10 mg one tablet as needed at bedtime.  This can cause drowsiness, so use caution.  It may be best to only take this at night for helping you during sleep.   Please schedule a follow-up appointment with Wilhelmina McardleLauren Cleveland Yarbro, AGNP. Return in about 2 months (around 01/10/2018) for smoking cessation.  If you have any other questions or concerns, please feel free to call the clinic or send a message through MyChart. You may also schedule an earlier appointment if necessary.  You will receive a survey after today's visit either digitally by e-mail or paper by [employer] mail. Your experiences and feedback matter to us.  Please respond so we know how we are doing as we provide care for you.   Wilhelmina McardleLauren Justine Cossin, DNP, AGNP-BC Adult Gerontology Nurse Practitioner [facility], [facility]  Low Back Pain Exercises See other page with pictures of each exercise.  Start with 1 or 2 of these exercises that you are most comfortable with. Do not do any exercises that cause you significant worsening pain. Some of these may cause some "stretching soreness" but it should go away after you stop the exercise, and get better over time. Gradually increase up to 3-4 exercises as tolerated.  Standing hamstring stretch: Place the  heel of your leg on a stool about 15 inches high. Keep your knee straight. Lean forward, bending at the hips until you feel a mild stretch in the back of your thigh. Make sure you do not roll your shoulders and bend at the waist when doing this or you will stretch your lower back instead. Hold the stretch for 15 to 30 seconds. Repeat 3 times. Repeat the same stretch on your other leg.  Cat and camel: Get down on your hands and knees. Let your stomach sag, allowing your back to curve downward. Hold this position for 5 seconds. Then arch your back and hold for 5 seconds. Do 3 sets of 10.  Quadriped Arm/Leg Raises: Get down on your hands and knees. Tighten your abdominal muscles to stiffen your spine. While keeping your abdominals tight, raise one arm and the opposite leg away from you. Hold this position for 5 seconds. Lower your arm and leg slowly and alternate sides. Do this 10 times on each side.  Pelvic tilt: Lie on your back with your knees bent and your feet flat on the floor. Tighten your abdominal muscles and push your lower back into the floor. Hold this position for 5 seconds, then relax. Do 3 sets of 10.  Partial curl: Lie on your back with your knees bent and your feet flat on the floor. Tighten your stomach muscles and flatten your back against the floor. Tuck your chin  to your chest. With your hands stretched out in front of you, curl your upper body forward until your shoulders clear the floor. Hold this position for 3 seconds. Don't hold your breath. It helps to breathe out as you lift your shoulders up. Relax. Repeat 10 times. Build to 3 sets of 10. To challenge yourself, clasp your hands behind your head and keep your elbows out to the side.  Lower trunk rotation: Lie on your back with your knees bent and your feet flat on the floor. Tighten your abdominal muscles and push your lower back into the floor. Keeping your shoulders down flat, gently rotate your legs to one side, then the other  as far as you can. Repeat 10 to 20 times.  Single knee to chest stretch: Lie on your back with your legs straight out in front of you. Bring one knee up to your chest and grasp the back of your thigh. Pull your knee toward your chest, stretching your buttock muscle. Hold this position for 15 to 30 seconds and return to the starting position. Repeat 3 times on each side.  Double knee to chest: Lie on your back with your knees bent and your feet flat on the floor. Tighten your abdominal muscles and push your lower back into the floor. Pull both knees up to your chest. Hold for 5 seconds and repeat 10 to 20 times.

## 2017-11-23 DIAGNOSIS — G4733 Obstructive sleep apnea (adult) (pediatric): Secondary | ICD-10-CM | POA: Diagnosis not present

## 2017-11-25 ENCOUNTER — Encounter: Payer: Self-pay | Admitting: Nurse Practitioner

## 2017-11-25 DIAGNOSIS — E119 Type 2 diabetes mellitus without complications: Secondary | ICD-10-CM | POA: Diagnosis not present

## 2017-11-25 LAB — HM DIABETES EYE EXAM

## 2017-11-30 DIAGNOSIS — H5213 Myopia, bilateral: Secondary | ICD-10-CM | POA: Diagnosis not present

## 2017-12-20 ENCOUNTER — Other Ambulatory Visit: Payer: Self-pay | Admitting: Nurse Practitioner

## 2017-12-20 DIAGNOSIS — E119 Type 2 diabetes mellitus without complications: Secondary | ICD-10-CM

## 2017-12-29 ENCOUNTER — Encounter: Payer: Self-pay | Admitting: Nurse Practitioner

## 2018-01-01 ENCOUNTER — Other Ambulatory Visit: Payer: Self-pay | Admitting: Nurse Practitioner

## 2018-01-01 DIAGNOSIS — M545 Low back pain, unspecified: Secondary | ICD-10-CM

## 2018-01-10 ENCOUNTER — Ambulatory Visit (INDEPENDENT_AMBULATORY_CARE_PROVIDER_SITE_OTHER): Payer: Medicaid Other | Admitting: Nurse Practitioner

## 2018-01-10 ENCOUNTER — Other Ambulatory Visit: Payer: Self-pay

## 2018-01-10 ENCOUNTER — Encounter: Payer: Self-pay | Admitting: Nurse Practitioner

## 2018-01-10 VITALS — BP 109/49 | HR 61 | Temp 98.1°F | Ht 64.0 in | Wt 226.0 lb

## 2018-01-10 DIAGNOSIS — Z716 Tobacco abuse counseling: Secondary | ICD-10-CM

## 2018-01-10 DIAGNOSIS — J301 Allergic rhinitis due to pollen: Secondary | ICD-10-CM

## 2018-01-10 DIAGNOSIS — I10 Essential (primary) hypertension: Secondary | ICD-10-CM

## 2018-01-10 DIAGNOSIS — I25118 Atherosclerotic heart disease of native coronary artery with other forms of angina pectoris: Secondary | ICD-10-CM | POA: Diagnosis not present

## 2018-01-10 MED ORDER — FLUTICASONE PROPIONATE 50 MCG/ACT NA SUSP: 2.0000 | Freq: Every day | NASAL | 6 refills | Status: DC

## 2018-01-10 MED ORDER — NITROGLYCERIN 0.4 MG SL SUBL: 0.4000 mg | SUBLINGUAL_TABLET | SUBLINGUAL | 3 refills | Status: DC | PRN

## 2018-01-10 MED ORDER — VARENICLINE TARTRATE 1 MG PO TABS: 1.0000 mg | ORAL_TABLET | Freq: Two times a day (BID) | ORAL | 2 refills | Status: DC

## 2018-01-10 NOTE — Assessment & Plan Note (Signed)
Stable on Imdur.  Patient with recent stopping of Imdur r/t concerns of not digesting the pill.  Patient without chest pain to date.  Cautioned patient.  START nitroglycerin SL 1 tab q5 min x 3 doses if needed for new onset chest pain.  Call cardiology for followup.  Followup prn.

## 2018-01-10 NOTE — Patient Instructions (Addendum)
Calvin Ortiz,   Thank you for coming in to clinic today.  1.  Please call your cardiologist about your Imdur. [name], [name], MD  [address]  [place], [place] [address]  [phone]   2. Stop hydrochlorothiazide.  Call clinic if BP > 130/90.    3. Continue Chantix up to 3 more months.  May stop sooner if you are having NO cravings.  4. START Flonase 2 sprays in each nostril once daily for next 2-4 weeks.  START mucinex (guaifenesin) twice daily for 5-7 days to loosen secretions. - CONTINUE Allegra  Please schedule a follow-up appointment with Calvin Ortiz, AGNP. Return in about 3 months (around 04/11/2018) for hypertension.  If you have any other questions or concerns, please feel free to call the clinic or send a message through MyChart. You may also schedule an earlier appointment if necessary.  You will receive a survey after today's visit either digitally by e-mail or paper by [employer] mail. Your experiences and feedback matter to us.  Please respond so we know how we are doing as we provide care for you.   Calvin McardleLauren Teondre Jarosz, DNP, AGNP-BC Adult Gerontology Nurse Practitioner [facility], [facility]

## 2018-01-10 NOTE — Progress Notes (Signed)
Subjective:    Patient ID: Calvin Ortiz, male    DOB: 05/12/1970, 47 y.o.   MRN: [id number]  Calvin Ortiz is a 47 y.o. male presenting on 01/10/2018 for Nicotine Dependence   HPI CAD/HTN States he is not able to digest his Imdur.  Pill exits his body whole, so he stopped taking this about 2 days ago.  On third day would have constipation and pills were intact.  Even with pill crusher, pill fragments were still in bowel movements.  - He is checking BP at home or outside of clinic.  Readings 120-131/40, lowest 115/42. - Current medications: amlodipine 10 mg, carvedilol 25 mg bid, HCTZ 12.5, lisinopril 20 mg daily, Imdur 120 mg once daily, tolerating with side effects dizziness intermittently - He is symptomatic with dizziness/lightheadedness. - Pt denies headache, changes in vision, chest tightness/pressure, palpitations, leg swelling, sudden loss of speech or loss of consciousness. - He  reports no regular exercise routine. - His diet is moderate in salt, moderate in fat, and moderate in carbohydrates.   Smoking Cessation Feeling very well with smoking cessation.  Is still taking his Chantix.  Continues to have some cravings, but is walking away from it.  His mother continues to smoke, which continues some of patient's.  Doesn't sit at table after dinner to avoid the temptation to smoke.  This is most difficult to continue avoiding. Reports no significant side effects and denies all depression.  Chest Congestion Onset of symptoms about 10 days ago started having sinus symptoms, sinus congestion/pressure, runny nose, dry cough, nausea and vomiting x2.  Now with slightly productive cough.  No fever, chills, or sweats at this time.  Social History   Tobacco Use  . Smoking status: Former Smoker    Packs/day: 0.00    Years: 25.00    Pack years: 0.00    Types: Cigarettes    Last attempt to quit: 12/13/2017    Years since quitting: 0.0  . Smokeless tobacco: Never Used  .  Tobacco comment: Has quit off and on about 5-6 times (09/2017)  Substance Use Topics  . Alcohol use: No  . Drug use: No    Review of Systems Per HPI unless specifically indicated above     Objective:    BP (!) 109/49 (BP Location: Right Arm, Patient Position: Sitting, Cuff Size: Normal)   Pulse 61   Temp 98.1 F (36.7 C) (Oral)   Ht 5\' 4"  (1.626 m)   Wt 226 lb (102.5 kg)   BMI 38.79 kg/m   Wt Readings from Last 3 Encounters:  01/10/18 226 lb (102.5 kg)  11/09/17 236 lb 9.6 oz (107.3 kg)  10/11/17 230 lb 3.2 oz (104.4 kg)    Physical Exam  Constitutional: He is oriented to person, place, and time. He appears well-developed and well-nourished. No distress.  HENT:  Head: Normocephalic and atraumatic.  Right Ear: Hearing, external ear and ear canal normal. No drainage or swelling. Tympanic membrane is not injected, not scarred, not perforated, not erythematous, not retracted and not bulging. A middle ear effusion is present.  Left Ear: Hearing, tympanic membrane, external ear and ear canal normal. No drainage or swelling. Tympanic membrane is not injected, not scarred, not perforated, not erythematous, not retracted and not bulging.  No middle ear effusion.  Nose: Right sinus exhibits maxillary sinus tenderness. Right sinus exhibits no frontal sinus tenderness. Left sinus exhibits maxillary sinus tenderness. Left sinus exhibits no frontal sinus tenderness.  Mouth/Throat: Uvula is midline,  oropharynx is clear and moist and mucous membranes are normal. Tonsils are 0 on the right. Tonsils are 0 on the left.  Eyes: Conjunctivae and lids are normal.  Cardiovascular: Normal rate, regular rhythm, S1 normal, S2 normal, normal heart sounds and intact distal pulses.  Pulmonary/Chest: Effort normal. No respiratory distress. He has decreased breath sounds (mildly decreased throughout all lobes). He has no wheezes. He has no rhonchi. He has no rales.  Neurological: He is alert and oriented to  person, place, and time.  Skin: Skin is warm and dry. Capillary refill takes less than 2 seconds.  Psychiatric: He has a normal mood and affect. His behavior is normal. Judgment and thought content normal.  Vitals reviewed.   Results for orders placed or performed in visit on 11/29/17  HM DIABETES EYE EXAM  Result Value Ref Range   HM Diabetic Eye Exam No Retinopathy No Retinopathy      Assessment & Plan:   Problem List Items Addressed This Visit      Cardiovascular and Mediastinum   Hypertension    Controlled hypertension.  BP goal < 130/80.  Pt is working on lifestyle modifications - smoking cessation and has stopped smoking completely 1 month ago.  Taking medications tolerating with side effects of dizziness/lightheadeness.   - Complications: stopped Imdur 3 days ago.  No chest pain to date.  Cautioned patient.  Contact Cardiology.  Plan: 1. Continue meds except hydrochlorothiazide 12.5 mg once daily 2. Last labs WNL.  Obtain repeat next visit.  3. Encouraged heart healthy diet and increasing exercise to 30 minutes most days of the week. 4. Check BP 1-2 x per week at home, keep log, and bring to clinic at next appointment. 5. Follow up 3 months.        Relevant Medications   nitroGLYCERIN (NITROSTAT) 0.4 MG SL tablet   Coronary artery disease of native artery of native heart with stable angina pectoris (HCC)    Stable on Imdur.  Patient with recent stopping of Imdur r/t concerns of not digesting the pill.  Patient without chest pain to date.  Cautioned patient.  START nitroglycerin SL 1 tab q5 min x 3 doses if needed for new onset chest pain.  Call cardiology for followup.  Followup prn.      Relevant Medications   nitroGLYCERIN (NITROSTAT) 0.4 MG SL tablet     Respiratory   Seasonal allergic rhinitis due to pollen    Acute worsening of sinus pressure over last 2 weeks.  No active infection, but patient with RIGHT ear effusion.  START flonase 2 sprays each nostril daily,  continue Allegra, START mucinex bid x 5-7 days.  If worsening or with new fever, may need Abx.  Call clinic in next 5-7 days prn.      Relevant Medications   fluticasone (FLONASE) 50 MCG/ACT nasal spray    Other Visit Diagnoses    Encounter for smoking cessation counseling    -  Primary   Relevant Medications   varenicline (CHANTIX CONTINUING MONTH PAK) 1 MG tablet    Patient with successful smoking cessation.  Continues to have cravings at dinner.  Can continue to max 6 months for Chantix.  Continue chantix.  Patient to stop 1-2 weeks after cravings cease if able.  If not able, will stop at next visit regardless.   Followup 3 months.  Meds ordered this encounter  Medications  . varenicline (CHANTIX CONTINUING MONTH PAK) 1 MG tablet    Sig: Take 1 tablet (  1 mg total) by mouth 2 (two) times daily.    Dispense:  60 tablet    Refill:  2    Order Specific Question:   Supervising Provider    Answer:   Smitty Cords [[id number]]  . nitroGLYCERIN (NITROSTAT) 0.4 MG SL tablet    Sig: Place 1 tablet (0.4 mg total) under the tongue every 5 (five) minutes as needed for chest pain (Take up to 3 doses).    Dispense:  50 tablet    Refill:  3    Order Specific Question:   Supervising Provider    Answer:   Smitty Cords [[id number]]  . fluticasone (FLONASE) 50 MCG/ACT nasal spray    Sig: Place 2 sprays into both nostrils daily.    Dispense:  16 g    Refill:  6    Order Specific Question:   Supervising Provider    Answer:   Smitty Cords [[id number]]    Follow up plan: Return in about 3 months (around 04/11/2018) for hypertension.  [name], DNP, AGPCNP-BC Adult Gerontology Primary Care Nurse Practitioner [facility] 01/10/2018, 1:28 PM

## 2018-01-10 NOTE — Assessment & Plan Note (Signed)
Controlled hypertension.  BP goal < 130/80.  Pt is working on lifestyle modifications - smoking cessation and has stopped smoking completely 1 month ago.  Taking medications tolerating with side effects of dizziness/lightheadeness.   - Complications: stopped Imdur 3 days ago.  No chest pain to date.  Cautioned patient.  Contact Cardiology.  Plan: 1. Continue meds except hydrochlorothiazide 12.5 mg once daily 2. Last labs WNL.  Obtain repeat next visit.  3. Encouraged heart healthy diet and increasing exercise to 30 minutes most days of the week. 4. Check BP 1-2 x per week at home, keep log, and bring to clinic at next appointment. 5. Follow up 3 months.

## 2018-01-10 NOTE — Assessment & Plan Note (Signed)
Acute worsening of sinus pressure over last 2 weeks.  No active infection, but patient with RIGHT ear effusion.  START flonase 2 sprays each nostril daily, continue Allegra, START mucinex bid x 5-7 days.  If worsening or with new fever, may need Abx.  Call clinic in next 5-7 days prn.

## 2018-01-24 DIAGNOSIS — H524 Presbyopia: Secondary | ICD-10-CM | POA: Diagnosis not present

## 2018-01-30 ENCOUNTER — Other Ambulatory Visit: Payer: Self-pay | Admitting: Nurse Practitioner

## 2018-01-30 DIAGNOSIS — I252 Old myocardial infarction: Secondary | ICD-10-CM

## 2018-01-30 DIAGNOSIS — M545 Low back pain, unspecified: Secondary | ICD-10-CM

## 2018-01-30 DIAGNOSIS — I10 Essential (primary) hypertension: Secondary | ICD-10-CM

## 2018-02-03 ENCOUNTER — Other Ambulatory Visit: Payer: Self-pay | Admitting: Nurse Practitioner

## 2018-02-03 DIAGNOSIS — E7849 Other hyperlipidemia: Secondary | ICD-10-CM

## 2018-02-03 DIAGNOSIS — I10 Essential (primary) hypertension: Secondary | ICD-10-CM

## 2018-02-04 ENCOUNTER — Other Ambulatory Visit: Payer: Self-pay | Admitting: Nurse Practitioner

## 2018-02-04 DIAGNOSIS — E119 Type 2 diabetes mellitus without complications: Secondary | ICD-10-CM

## 2018-03-01 ENCOUNTER — Other Ambulatory Visit: Payer: Self-pay | Admitting: Nurse Practitioner

## 2018-03-01 DIAGNOSIS — M545 Low back pain, unspecified: Secondary | ICD-10-CM

## 2018-03-01 DIAGNOSIS — I252 Old myocardial infarction: Secondary | ICD-10-CM

## 2018-03-01 DIAGNOSIS — I10 Essential (primary) hypertension: Secondary | ICD-10-CM

## 2018-03-04 ENCOUNTER — Other Ambulatory Visit: Payer: Self-pay | Admitting: Family Medicine

## 2018-03-04 DIAGNOSIS — E119 Type 2 diabetes mellitus without complications: Secondary | ICD-10-CM

## 2018-04-18 ENCOUNTER — Other Ambulatory Visit: Payer: Self-pay | Admitting: Nurse Practitioner

## 2018-04-18 ENCOUNTER — Other Ambulatory Visit: Payer: Self-pay

## 2018-04-18 ENCOUNTER — Ambulatory Visit (INDEPENDENT_AMBULATORY_CARE_PROVIDER_SITE_OTHER): Payer: Medicaid Other | Admitting: Nurse Practitioner

## 2018-04-18 ENCOUNTER — Encounter: Payer: Self-pay | Admitting: Nurse Practitioner

## 2018-04-18 VITALS — BP 145/71 | HR 60 | Temp 98.1°F | Ht 64.0 in | Wt 212.0 lb

## 2018-04-18 DIAGNOSIS — J301 Allergic rhinitis due to pollen: Secondary | ICD-10-CM | POA: Diagnosis not present

## 2018-04-18 DIAGNOSIS — E119 Type 2 diabetes mellitus without complications: Secondary | ICD-10-CM

## 2018-04-18 DIAGNOSIS — Z87891 Personal history of nicotine dependence: Secondary | ICD-10-CM | POA: Diagnosis not present

## 2018-04-18 DIAGNOSIS — J310 Chronic rhinitis: Secondary | ICD-10-CM | POA: Diagnosis not present

## 2018-04-18 DIAGNOSIS — I10 Essential (primary) hypertension: Secondary | ICD-10-CM | POA: Diagnosis not present

## 2018-04-18 DIAGNOSIS — E7849 Other hyperlipidemia: Secondary | ICD-10-CM

## 2018-04-18 DIAGNOSIS — H9192 Unspecified hearing loss, left ear: Secondary | ICD-10-CM | POA: Diagnosis not present

## 2018-04-18 DIAGNOSIS — Z23 Encounter for immunization: Secondary | ICD-10-CM

## 2018-04-18 LAB — POCT GLYCOSYLATED HEMOGLOBIN (HGB A1C): Hemoglobin A1C: 5.4 % (ref 4.0–5.6)

## 2018-04-18 NOTE — Progress Notes (Signed)
Subjective:    Patient ID: Calvin Ortiz, male    DOB: 07/15/1970, 47 y.o.   MRN: [id number]  Calvin Ortiz is a 47 y.o. male presenting on 04/18/2018 for Hypertension and Nicotine Dependence   HPI Nicotine dependence Patient reports he has continued to be stopped from cigarette smoking.  He has stopped Chantix as well about a month ago.  Is not having cravings.  Hypertension - He is checking BP at home or outside of clinic.  Readings 135/70s usually - Current medications: amlodipine 10 mg daily, carvedilol 25 mg bid, Imdur 120 mg daily, tolerating well without side effects - He is not currently symptomatic. - Pt denies headache, lightheadedness, dizziness, changes in vision, chest tightness/pressure, palpitations, leg swelling, sudden loss of speech or loss of consciousness. - He  reports no regular exercise routine, but is regularly active with cleaning out a house right now. - His diet is moderate in salt, moderate in fat, and moderate in carbohydrates.  - Patient has worked to lose weight and is working on cleaning his new home where he lives with his mother who is diabled. - Patient notes he is now digesting his Imdur and does not note pill in his bowel movement   Diabetes Pt presents today for follow up of Type 2 diabetes mellitus. He is not checking CBG at home.  - Current diabetic medications include: metformin 500 mg breakfast - He is not currently symptomatic.  - He denies polydipsia, polyphagia, polyuria, headaches, diaphoresis, shakiness, chills, pain, numbness or tingling in extremities and changes in vision.   - Clinical course has been stable. - Weight trend: decreasing steadily  PREVENTION: Eye exam current (within one year): yes Foot exam current (within one year): yes Lipid/ASCVD risk reduction - on statin: yes Kidney protection - on ace or arb: yes  Recent Labs    07/02/17 1345 10/11/17 1421 04/18/18 1338  HGBA1C 6.2 6.2* 5.4     Sinus  congestion Patient has had severe allergies in past, now worsening with poor control using typical meds that previously controlled symptoms.  Takes 180 mg Allegra daily and flonase 2 sprays in both nostrils daily.  Social History   Tobacco Use  . Smoking status: Former Smoker    Packs/day: 0.00    Years: 25.00    Pack years: 0.00    Types: Cigarettes    Last attempt to quit: 04/13/2018    Years since quitting: 0.0  . Smokeless tobacco: Never Used  . Tobacco comment: Has quit off and on about 5-6 times (09/2017)  Substance Use Topics  . Alcohol use: No  . Drug use: No    Review of Systems Per HPI unless specifically indicated above     Objective:    BP (!) 145/71 (BP Location: Right Arm, Patient Position: Sitting, Cuff Size: Normal)   Pulse 60   Temp 98.1 F (36.7 C) (Oral)   Ht 5\' 4"  (1.626 m)   Wt 212 lb (96.2 kg)   BMI 36.39 kg/m   Wt Readings from Last 3 Encounters:  04/18/18 212 lb (96.2 kg)  01/10/18 226 lb (102.5 kg)  11/09/17 236 lb 9.6 oz (107.3 kg)    Physical Exam Vitals signs reviewed.  Constitutional:      General: He is not in acute distress.    Appearance: He is well-developed.  HENT:     Head: Normocephalic and atraumatic.     Right Ear: Hearing, tympanic membrane, ear canal and external ear normal.  Left Ear: Tympanic membrane, ear canal and external ear normal. Decreased hearing noted.     Nose: Mucosal edema and congestion present.     Right Sinus: No maxillary sinus tenderness or frontal sinus tenderness.     Left Sinus: No maxillary sinus tenderness or frontal sinus tenderness.     Mouth/Throat:     Lips: Pink.     Mouth: Mucous membranes are moist.     Pharynx: Oropharynx is clear.  Cardiovascular:     Rate and Rhythm: Normal rate and regular rhythm.     Pulses:          Radial pulses are 2+ on the right side and 2+ on the left side.       Posterior tibial pulses are 1+ on the right side and 1+ on the left side.     Heart sounds:  Normal heart sounds, S1 normal and S2 normal.  Pulmonary:     Effort: Pulmonary effort is normal. No respiratory distress.     Breath sounds: Normal breath sounds and air entry.  Musculoskeletal:     Right lower leg: No edema.     Left lower leg: No edema.  Skin:    General: Skin is warm and dry.     Capillary Refill: Capillary refill takes less than 2 seconds.  Neurological:     Mental Status: He is alert and oriented to person, place, and time.  Psychiatric:        Attention and Perception: Attention normal.        Mood and Affect: Mood and affect normal.        Behavior: Behavior normal. Behavior is cooperative.        Judgment: Judgment normal.      Results for orders placed or performed in visit on 11/29/17  HM DIABETES EYE EXAM  Result Value Ref Range   HM Diabetic Eye Exam No Retinopathy No Retinopathy      Assessment & Plan:   Problem List Items Addressed This Visit      Cardiovascular and Mediastinum   Hypertension Controlled hypertension.  BP goal < 130/80.  Pt is working on lifestyle modifications.  Taking medications tolerating well without side effects. No current complications.  Plan: 1. Continue taking medications without changes 2. Obtain labs next visit  3. Encouraged heart healthy diet and increasing exercise to 30 minutes most days of the week. 4. Check BP 1-2 x per week at home, keep log, and bring to clinic at next appointment. 5. Follow up 3 months.     Relevant Orders   COMPLETE METABOLIC PANEL WITH GFR     Respiratory   Seasonal allergic rhinitis due to pollen Consistent with chronic allergic rhinitis, non-seasonal, unknown triggers. - Today no evidence of acute sinusitis or complication.  - Hasrecently used Allegra, Flonase  Plan: 1. Continue nasal fluticasone 2 sprays each nostril once daily for 4 weeks. 2. Try change to antihistamine cetirizine 10 mg once daily or Xyzal once daily. 3.  Can continue saline rinses up to twice daily if  needed.  Use at alternate time from fluticasone. 4. ENT referral indicate at this time 5. Follow-up in future, as needed.   Relevant Orders   Ambulatory referral to ENT     Endocrine   Diabetes mellitus without complication (HCC) - Primary Well-controlledDM with A1c 5.4% improved from >6% and goal A1c < 7.0%. - Complications - CAD and hyperlipidemia.  Plan:  1. STOP metformin 2. Encourage improved lifestyle: -  low carb/low glycemic diet reinforced prior education - Increase physical activity to 30 minutes most days of the week.  Explained that increased physical activity increases body's use of sugar for energy. 3. Continue ASA, ACEi and Statin 4. Advised to schedule DM ophtho exam, send record. when due next year. 6. Follow-up 3 mos    Relevant Orders   POCT glycosylated hemoglobin (Hb A1C) (Completed)     Other   Other hyperlipidemia Stable today on exam.  Medications tolerated without side effects.  Continue at current doses.  Refills provided.  Check labs today. Followup 3 months.    Relevant Orders   Lipid panel   COMPLETE METABOLIC PANEL WITH GFR   Former smoker Patient has successfully quit smoking. Continue off chantix.  Reinforced need to continue non-pharm avoidance strategies.  FOLLOW-UP prn.    Other Visit Diagnoses    Flu vaccine need       Relevant Orders   Flu Vaccine QUAD 36+ mos IM (Completed)   Decreased hearing of left ear       Relevant Orders   Ambulatory referral to ENT   Chronic rhinitis       Relevant Orders   Ambulatory referral to ENT       Follow up plan: Return in about 3 months (around 07/18/2018) for diabetes, hypertension.  Wilhelmina McardleLauren Vannie Hilgert, DNP, AGPCNP-BC Adult Gerontology Primary Care Nurse Practitioner [facility] 04/18/2018, 1:29 PM

## 2018-04-18 NOTE — Patient Instructions (Addendum)
Denice Paradisehristopher L Hartstein,   Thank you for coming in to clinic today.  1. Continue off Chantix  2. Continue current BP medications without changes.  3. STOP metformin  4. ENT referral placed.  Dr. Wyn ForsterJuengel's [place] in [place] will call you to schedule.  [facility] [address]  [place], [place] [address] Ph: [phone]  [facility] [address]  [place], [place] [address] Ph: [phone]  You will be due for FASTING BLOOD WORK.  This means you should eat no food or drink after midnight.  Drink only water or coffee without cream/sugar on the morning of your lab visit. - Please go ahead and schedule a "Lab Only" visit in the morning at the clinic for lab draw in the next 7 days. - Your results will be available about 2-3 days after blood draw.  If you have set up a MyChart account, you can can log in to MyChart online to view your results and a brief explanation. Also, we can discuss your results together at your next office visit if you would like.  Please schedule a follow-up appointment with Wilhelmina McardleLauren Mohmmad Saleeby, AGNP. Return in about 3 months (around 07/18/2018) for diabetes, hypertension.  If you have any other questions or concerns, please feel free to call the clinic or send a message through MyChart. You may also schedule an earlier appointment if necessary.  You will receive a survey after today's visit either digitally by e-mail or paper by [employer] mail. Your experiences and feedback matter to us.  Please respond so we know how we are doing as we provide care for you.   Wilhelmina McardleLauren Doree Kuehne, DNP, AGNP-BC Adult Gerontology Nurse Practitioner [facility]    Influenza (Flu) Vaccine (Inactivated or Recombinant): What You Need to Know 1. Why get vaccinated? Influenza vaccine can prevent influenza (flu). Flu is a contagious disease that spreads around the [place] every year, usually between October and May. Anyone can get the  flu, but it is more dangerous for some people. Infants and young children, people 46 years of age and older, pregnant women, and people with certain health conditions or a weakened immune system are at greatest risk of flu complications. Pneumonia, bronchitis, sinus infections and ear infections are examples of flu-related complications. If you have a medical condition, such as heart disease, cancer or diabetes, flu can make it worse. Flu can cause fever and chills, sore throat, muscle aches, fatigue, cough, headache, and runny or stuffy nose. Some people may have vomiting and diarrhea, though this is more common in children than adults. Each year thousands of people in the [place] die from flu, and many more are hospitalized. Flu vaccine prevents millions of illnesses and flu-related visits to the doctor each year. 2. Influenza vaccine CDC recommends everyone 506 months of age and older get vaccinated every flu season. Children 6 months through 658 years of age may need 2 doses during a single flu season. Everyone else needs only 1 dose each flu season. It takes about 2 weeks for protection to develop after vaccination. There are many flu viruses, and they are always changing. Each year a new flu vaccine is made to protect against three or four viruses that are likely to cause disease in the upcoming flu season. Even when the vaccine doesn't exactly match these viruses, it may still provide some protection. Influenza vaccine does not cause flu. Influenza vaccine may be given at the same time as  other vaccines. 3. Talk with your health care provider Tell your vaccine provider if the person getting the vaccine:  Has had an allergic reaction after a previous dose of influenza vaccine, or has any severe, life-threatening allergies.  Has ever had Guillain-Barr Syndrome (also called GBS). In some cases, your health care provider may decide to postpone influenza vaccination to a future visit. People  with minor illnesses, such as a cold, may be vaccinated. People who are moderately or severely ill should usually wait until they recover before getting influenza vaccine. Your health care provider can give you more information. 4. Risks of a vaccine reaction  Soreness, redness, and swelling where shot is given, fever, muscle aches, and headache can happen after influenza vaccine.  There may be a very small increased risk of Guillain-Barr Syndrome (GBS) after inactivated influenza vaccine (the flu shot). Young children who get the flu shot along with pneumococcal vaccine (PCV13), and/or DTaP vaccine at the same time might be slightly more likely to have a seizure caused by fever. Tell your health care provider if a child who is getting flu vaccine has ever had a seizure. People sometimes faint after medical procedures, including vaccination. Tell your provider if you feel dizzy or have vision changes or ringing in the ears. As with any medicine, there is a very remote chance of a vaccine causing a severe allergic reaction, other serious injury, or death. 5. What if there is a serious problem? An allergic reaction could occur after the vaccinated person leaves the clinic. If you see signs of a severe allergic reaction (hives, swelling of the face and throat, difficulty breathing, a fast heartbeat, dizziness, or weakness), call 9-1-1 and get the person to the nearest hospital. For other signs that concern you, call your health care provider. Adverse reactions should be reported to the Vaccine Adverse Event Reporting System (VAERS). Your health care provider will usually file this report, or you can do it yourself. Visit the VAERS website at www.vaers.LAgents.nohhs.gov or call 850-083-83951-657-576-5223.VAERS is only for reporting reactions, and VAERS staff do not give medical advice. 6. The National Vaccine Injury Compensation Program The [employer] Vaccine Injury Compensation Program (VICP) is a federal program that was  created to compensate people who may have been injured by certain vaccines. Visit the VICP website at SpiritualWord.atwww.hrsa.gov/vaccinecompensation or call 404-309-02341-671-028-4624 to learn about the program and about filing a claim. There is a time limit to file a claim for compensation. 7. How can I learn more?  Ask your healthcare provider.  Call your local or state health department.  Contact the Centers for Disease Control and Prevention (CDC): ? Call (613)586-16541-(308) 160-8366 (1-800-CDC-INFO) or ? Visit CDC's BiotechRoom.com.cywww.cdc.gov/flu Vaccine Information Statement (Interim) Inactivated Influenza Vaccine (12/02/2017) This information is not intended to replace advice given to you by your health care provider. Make sure you discuss any questions you have with your health care provider. Document Released: 01/29/2006 Document Revised: 12/06/2017 Document Reviewed: 12/06/2017 Elsevier Interactive Patient Education  2019 [employer]

## 2018-04-19 ENCOUNTER — Encounter: Payer: Self-pay | Admitting: Nurse Practitioner

## 2018-04-27 ENCOUNTER — Other Ambulatory Visit: Payer: Medicaid Other

## 2018-04-27 DIAGNOSIS — I10 Essential (primary) hypertension: Secondary | ICD-10-CM | POA: Diagnosis not present

## 2018-04-27 DIAGNOSIS — E7849 Other hyperlipidemia: Secondary | ICD-10-CM | POA: Diagnosis not present

## 2018-04-27 LAB — COMPLETE METABOLIC PANEL WITH GFR
AG Ratio: 1.6 (calc) (ref 1.0–2.5)
ALT: 31 U/L (ref 9–46)
AST: 22 U/L (ref 10–40)
Albumin: 4.2 g/dL (ref 3.6–5.1)
Alkaline phosphatase (APISO): 54 U/L (ref 40–115)
BUN: 14 mg/dL (ref 7–25)
CO2: 25 mmol/L (ref 20–32)
Calcium: 9.4 mg/dL (ref 8.6–10.3)
Chloride: 108 mmol/L (ref 98–110)
Creat: 1.08 mg/dL (ref 0.60–1.35)
GFR, Est African American: 95 mL/min/{1.73_m2} (ref >=60)
GFR, Est Non African American: 82 mL/min/{1.73_m2} (ref >=60)
Globulin: 2.7 g/dL (calc) (ref 1.9–3.7)
Glucose, Bld: 122 mg/dL — ABNORMAL HIGH (ref 65–99)
Potassium: 4 mmol/L (ref 3.5–5.3)
Sodium: 141 mmol/L (ref 135–146)
Total Bilirubin: 0.4 mg/dL (ref 0.2–1.2)
Total Protein: 6.9 g/dL (ref 6.1–8.1)

## 2018-04-27 LAB — LIPID PANEL
Cholesterol: 173 mg/dL (ref <200)
HDL: 29 mg/dL — ABNORMAL LOW (ref >40)
LDL Cholesterol (Calc): 112 mg/dL (calc) — ABNORMAL HIGH
Non-HDL Cholesterol (Calc): 144 mg/dL (calc) — ABNORMAL HIGH (ref <130)
Total CHOL/HDL Ratio: 6 (calc) — ABNORMAL HIGH (ref <5.0)
Triglycerides: 204 mg/dL — ABNORMAL HIGH (ref <150)

## 2018-04-29 ENCOUNTER — Other Ambulatory Visit: Payer: Self-pay | Admitting: Nurse Practitioner

## 2018-04-29 DIAGNOSIS — E7849 Other hyperlipidemia: Secondary | ICD-10-CM

## 2018-04-29 MED ORDER — ATORVASTATIN CALCIUM 40 MG PO TABS: 40.0000 mg | ORAL_TABLET | Freq: Every day | ORAL | 1 refills | Status: DC

## 2018-05-13 DIAGNOSIS — H903 Sensorineural hearing loss, bilateral: Secondary | ICD-10-CM | POA: Diagnosis not present

## 2018-05-13 DIAGNOSIS — H905 Unspecified sensorineural hearing loss: Secondary | ICD-10-CM | POA: Diagnosis not present

## 2018-05-26 ENCOUNTER — Other Ambulatory Visit: Payer: Self-pay | Admitting: Nurse Practitioner

## 2018-05-26 DIAGNOSIS — E7849 Other hyperlipidemia: Secondary | ICD-10-CM

## 2018-06-07 ENCOUNTER — Other Ambulatory Visit: Payer: Self-pay | Admitting: Nurse Practitioner

## 2018-06-07 DIAGNOSIS — I10 Essential (primary) hypertension: Secondary | ICD-10-CM

## 2018-06-10 ENCOUNTER — Other Ambulatory Visit: Payer: Self-pay | Admitting: Nurse Practitioner

## 2018-06-10 DIAGNOSIS — I10 Essential (primary) hypertension: Secondary | ICD-10-CM

## 2018-06-15 ENCOUNTER — Telehealth: Payer: Self-pay | Admitting: Nurse Practitioner

## 2018-06-15 DIAGNOSIS — I10 Essential (primary) hypertension: Secondary | ICD-10-CM

## 2018-06-15 NOTE — Telephone Encounter (Signed)
Pt may need medicaid authorization for isosorbide.  His call back number is [phone]

## 2018-06-16 NOTE — Telephone Encounter (Signed)
I attempted to  initiate the prior auth, but it requires additional information that I need you to answer. Please advise

## 2018-06-21 ENCOUNTER — Other Ambulatory Visit: Payer: Self-pay | Admitting: Nurse Practitioner

## 2018-06-21 ENCOUNTER — Encounter: Payer: Self-pay | Admitting: Nurse Practitioner

## 2018-06-21 DIAGNOSIS — I10 Essential (primary) hypertension: Secondary | ICD-10-CM

## 2018-06-21 DIAGNOSIS — G4733 Obstructive sleep apnea (adult) (pediatric): Secondary | ICD-10-CM | POA: Diagnosis not present

## 2018-06-21 MED ORDER — ISOSORBIDE MONONITRATE ER 120 MG PO TB24: ORAL_TABLET | ORAL | 0 refills | Status: DC

## 2018-06-27 ENCOUNTER — Other Ambulatory Visit: Payer: Self-pay | Admitting: Nurse Practitioner

## 2018-06-27 DIAGNOSIS — K219 Gastro-esophageal reflux disease without esophagitis: Secondary | ICD-10-CM

## 2018-07-08 ENCOUNTER — Other Ambulatory Visit: Payer: Self-pay | Admitting: Nurse Practitioner

## 2018-07-08 DIAGNOSIS — I252 Old myocardial infarction: Secondary | ICD-10-CM

## 2018-07-08 DIAGNOSIS — I10 Essential (primary) hypertension: Secondary | ICD-10-CM

## 2018-08-06 ENCOUNTER — Other Ambulatory Visit: Payer: Self-pay | Admitting: Nurse Practitioner

## 2018-08-06 DIAGNOSIS — E7849 Other hyperlipidemia: Secondary | ICD-10-CM

## 2018-08-08 MED ORDER — ATORVASTATIN CALCIUM 40 MG PO TABS: 40.0000 mg | ORAL_TABLET | Freq: Every day | ORAL | 0 refills | Status: DC

## 2018-08-23 ENCOUNTER — Other Ambulatory Visit: Payer: Self-pay | Admitting: Nurse Practitioner

## 2018-08-23 DIAGNOSIS — E7849 Other hyperlipidemia: Secondary | ICD-10-CM

## 2018-08-31 ENCOUNTER — Other Ambulatory Visit: Payer: Self-pay | Admitting: Nurse Practitioner

## 2018-08-31 DIAGNOSIS — I10 Essential (primary) hypertension: Secondary | ICD-10-CM

## 2018-09-12 ENCOUNTER — Other Ambulatory Visit: Payer: Self-pay | Admitting: Nurse Practitioner

## 2018-09-12 DIAGNOSIS — I10 Essential (primary) hypertension: Secondary | ICD-10-CM

## 2018-09-18 ENCOUNTER — Other Ambulatory Visit: Payer: Self-pay | Admitting: Nurse Practitioner

## 2018-09-18 DIAGNOSIS — J301 Allergic rhinitis due to pollen: Secondary | ICD-10-CM

## 2018-09-29 ENCOUNTER — Other Ambulatory Visit: Payer: Self-pay | Admitting: Nurse Practitioner

## 2018-09-29 DIAGNOSIS — M545 Low back pain, unspecified: Secondary | ICD-10-CM

## 2018-10-19 DIAGNOSIS — G4733 Obstructive sleep apnea (adult) (pediatric): Secondary | ICD-10-CM | POA: Diagnosis not present

## 2018-11-04 ENCOUNTER — Other Ambulatory Visit: Payer: Self-pay | Admitting: Nurse Practitioner

## 2018-11-04 DIAGNOSIS — I10 Essential (primary) hypertension: Secondary | ICD-10-CM

## 2018-11-04 DIAGNOSIS — I252 Old myocardial infarction: Secondary | ICD-10-CM

## 2018-11-17 ENCOUNTER — Other Ambulatory Visit: Payer: Self-pay | Admitting: Family Medicine

## 2018-11-17 DIAGNOSIS — E7849 Other hyperlipidemia: Secondary | ICD-10-CM

## 2018-11-25 ENCOUNTER — Other Ambulatory Visit: Payer: Self-pay | Admitting: Nurse Practitioner

## 2018-11-25 ENCOUNTER — Other Ambulatory Visit: Payer: Self-pay | Admitting: Family Medicine

## 2018-11-25 DIAGNOSIS — I10 Essential (primary) hypertension: Secondary | ICD-10-CM

## 2018-12-01 ENCOUNTER — Encounter: Payer: Self-pay | Admitting: Nurse Practitioner

## 2018-12-01 ENCOUNTER — Ambulatory Visit (INDEPENDENT_AMBULATORY_CARE_PROVIDER_SITE_OTHER): Payer: Medicaid Other | Admitting: Nurse Practitioner

## 2018-12-01 ENCOUNTER — Other Ambulatory Visit: Payer: Self-pay

## 2018-12-01 DIAGNOSIS — I10 Essential (primary) hypertension: Secondary | ICD-10-CM | POA: Diagnosis not present

## 2018-12-01 DIAGNOSIS — Z87891 Personal history of nicotine dependence: Secondary | ICD-10-CM | POA: Diagnosis not present

## 2018-12-01 DIAGNOSIS — Z716 Tobacco abuse counseling: Secondary | ICD-10-CM

## 2018-12-01 DIAGNOSIS — E7849 Other hyperlipidemia: Secondary | ICD-10-CM

## 2018-12-01 MED ORDER — BENAZEPRIL HCL 40 MG PO TABS: 40.0000 mg | ORAL_TABLET | Freq: Every day | ORAL | 3 refills | Status: DC

## 2018-12-01 NOTE — Progress Notes (Signed)
Telemedicine Encounter: Disclosed to patient at start of encounter that we will provide appropriate telemedicine services.  Patient consents to be treated via phone prior to discussion. - Patient is at his home and is accessed via telephone. - Services are provided by Wilhelmina McardleLauren Kerin Cecchi from [facility].  Subjective:    Patient ID: Calvin Ortiz, male    DOB: 09/30/1970, 48 y.o.   MRN: [id number]  Calvin Ortiz is a 48 y.o. male presenting on 12/01/2018 for Hypertension  HPI Hypertension - He is checking BP at home or outside of clinic.  Readings 170/76 last reading this week.  Usually around 150/70s twice per week  - Current medications: amlodipine 10 mg daily, carvedilol 25 mg bid, imdur 120 mg daily; tolerating well without side effects.  Has remained stable with Cardiology for several years, so stopped seeing them and transitioned to PCP management.  Will return to Cardiology if anginal symptoms recur. - He is not currently symptomatic. - Pt denies headache, lightheadedness, dizziness, changes in vision, chest tightness/pressure, palpitations, leg swelling, sudden loss of speech or loss of consciousness. - He  reports no regular exercise routine. - His diet is moderate in salt, moderate in fat, and moderate in carbohydrates. - Patient reports home scale weight about 189.  Hyperlipidemia with hypertriglyceridemia Patient continues to take atorvastatin 40 mg and fenofibrate 145 mg once daily for management.  Previously stable on labs.  Denies side effects of medication including muscle cramps/aching.  Pt denies changes in vision, chest tightness/pressure, palpitations, shortness of breath, leg pain while walking, leg or arm weakness, and sudden loss of speech or loss of consciousness. No anginal symptoms or other ASCVD events since last visit.  Former Smoker Started having cravings again with stress from virus.  Instead of smoking, tries to stay distracted with  gardening.  Patient completely quit smoking again about 8 months ago.  Social History   Tobacco Use  . Smoking status: Former Smoker    Packs/day: 0.00    Years: 25.00    Pack years: 0.00    Types: Cigarettes    Quit date: 04/13/2018    Years since quitting: 0.6  . Smokeless tobacco: Never Used  . Tobacco comment: Has quit off and on about 5-6 times (09/2017)  Substance Use Topics  . Alcohol use: No  . Drug use: No    Review of Systems Per HPI unless specifically indicated above     Objective:    There were no vitals taken for this visit.  Wt Readings from Last 3 Encounters:  04/18/18 212 lb (96.2 kg)  01/10/18 226 lb (102.5 kg)  11/09/17 236 lb 9.6 oz (107.3 kg)    Physical Exam Patient remotely monitored.  Verbal communication appropriate.  Cognition normal.   Results for orders placed or performed in visit on 04/18/18  Lipid panel  Result Value Ref Range   Cholesterol 173 <200 mg/dL   HDL 29 (L) >40>40 mg/dL   Triglycerides 981204 (H) <150 mg/dL   LDL Cholesterol (Calc) 112 (H) mg/dL (calc)   Total CHOL/HDL Ratio 6.0 (H) <5.0 (calc)   Non-HDL Cholesterol (Calc) 144 (H) <130 mg/dL (calc)  COMPLETE METABOLIC PANEL WITH GFR  Result Value Ref Range   Glucose, Bld 122 (H) 65 - 99 mg/dL   BUN 14 7 - 25 mg/dL   Creat 1.911.08 4.780.60 - 2.951.35 mg/dL   GFR, Est Non African American 82 > OR = 60 mL/min/1.4473m2   GFR, Est African American 95 >  OR = 60 mL/min/1.39m2   BUN/Creatinine Ratio NOT APPLICABLE 6 - 22 (calc)   Sodium 141 135 - 146 mmol/L   Potassium 4.0 3.5 - 5.3 mmol/L   Chloride 108 98 - 110 mmol/L   CO2 25 20 - 32 mmol/L   Calcium 9.4 8.6 - 10.3 mg/dL   Total Protein 6.9 6.1 - 8.1 g/dL   Albumin 4.2 3.6 - 5.1 g/dL   Globulin 2.7 1.9 - 3.7 g/dL (calc)   AG Ratio 1.6 1.0 - 2.5 (calc)   Total Bilirubin 0.4 0.2 - 1.2 mg/dL   Alkaline phosphatase (APISO) 54 40 - 115 U/L   AST 22 10 - 40 U/L   ALT 31 9 - 46 U/L  POCT glycosylated hemoglobin (Hb A1C)  Result Value Ref  Range   Hemoglobin A1C 5.4 4.0 - 5.6 %   HbA1c POC (<> result, manual entry)     HbA1c, POC (prediabetic range)     HbA1c, POC (controlled diabetic range)        Assessment & Plan:   Problem List Items Addressed This Visit      Cardiovascular and Mediastinum   Hypertension - Primary Uncontrolled hypertension.  BP goal < 130/80.  Pt is working on lifestyle modifications.  Taking medications tolerating well without side effects. Complications: Anxiety  Plan: 1. Continue taking medications as previously. - START benazepril 40 mg once daily 2. Obtain labs in next 4 weeks  3. Encouraged heart healthy diet and increasing exercise to 30 minutes most days of the week. 4. Check BP 1-2 x per week at home, keep log, and bring to clinic at next appointment. 5. Follow up 1-2 months depending on home BP readings/symptoms with addition of benazepril.   Relevant Medications   benazepril (LOTENSIN) 40 MG tablet   Other Relevant Orders   COMPLETE METABOLIC PANEL WITH GFR     Other   Other hyperlipidemia Stable today on exam.  Medications tolerated without side effects.  Continue at current doses.  Refills provided.  Check labs today. Followup 6 months or sooner prn after labs.    Relevant Medications   benazepril (LOTENSIN) 40 MG tablet   Other Relevant Orders   COMPLETE METABOLIC PANEL WITH GFR   Lipid panel   CK (Creatine Kinase)   Former smoker    Other Visit Diagnoses    Encounter for smoking cessation counseling     Continued to encourage patient to have plan for when cravings arise.  Outdoor gardening okay at this time of year, but should work now to develop plan for colder months.  Patient verbalizes understanding.      Meds ordered this encounter  Medications  . benazepril (LOTENSIN) 40 MG tablet    Sig: Take 1 tablet (40 mg total) by mouth daily.    Dispense:  90 tablet    Refill:  3    Order Specific Question:   Supervising Provider    Answer:   Olin Hauser  [[id number]]   - Time spent in direct consultation with patient via telemedicine about above concerns: 9 minutes  Follow up plan: Return in about 2 months (around 01/31/2019) for hypertension AND labs in 2 weeks.  Cassell Smiles, DNP, AGPCNP-BC Adult Gerontology Primary Care Nurse Practitioner [facility] 12/01/2018, 3:43 PM

## 2018-12-04 ENCOUNTER — Encounter: Payer: Self-pay | Admitting: Nurse Practitioner

## 2018-12-04 ENCOUNTER — Other Ambulatory Visit: Payer: Self-pay | Admitting: Nurse Practitioner

## 2018-12-04 ENCOUNTER — Other Ambulatory Visit: Payer: Self-pay | Admitting: Family Medicine

## 2018-12-04 DIAGNOSIS — M545 Low back pain, unspecified: Secondary | ICD-10-CM

## 2018-12-04 DIAGNOSIS — E119 Type 2 diabetes mellitus without complications: Secondary | ICD-10-CM

## 2018-12-15 ENCOUNTER — Other Ambulatory Visit: Payer: Self-pay | Admitting: Nurse Practitioner

## 2018-12-15 ENCOUNTER — Other Ambulatory Visit: Payer: Self-pay

## 2018-12-15 ENCOUNTER — Other Ambulatory Visit: Payer: Medicaid Other

## 2018-12-15 DIAGNOSIS — E7849 Other hyperlipidemia: Secondary | ICD-10-CM | POA: Diagnosis not present

## 2018-12-15 DIAGNOSIS — I10 Essential (primary) hypertension: Secondary | ICD-10-CM | POA: Diagnosis not present

## 2018-12-15 DIAGNOSIS — F4010 Social phobia, unspecified: Secondary | ICD-10-CM

## 2018-12-16 LAB — COMPLETE METABOLIC PANEL WITH GFR
AG Ratio: 2 (calc) (ref 1.0–2.5)
ALT: 21 U/L (ref 9–46)
AST: 16 U/L (ref 10–40)
Albumin: 4.3 g/dL (ref 3.6–5.1)
Alkaline phosphatase (APISO): 46 U/L (ref 36–130)
BUN: 19 mg/dL (ref 7–25)
CO2: 24 mmol/L (ref 20–32)
Calcium: 9.1 mg/dL (ref 8.6–10.3)
Chloride: 107 mmol/L (ref 98–110)
Creat: 1.3 mg/dL (ref 0.60–1.35)
GFR, Est African American: 75 mL/min/{1.73_m2} (ref >=60)
GFR, Est Non African American: 65 mL/min/{1.73_m2} (ref >=60)
Globulin: 2.2 g/dL (calc) (ref 1.9–3.7)
Glucose, Bld: 115 mg/dL — ABNORMAL HIGH (ref 65–99)
Potassium: 4 mmol/L (ref 3.5–5.3)
Sodium: 139 mmol/L (ref 135–146)
Total Bilirubin: 0.4 mg/dL (ref 0.2–1.2)
Total Protein: 6.5 g/dL (ref 6.1–8.1)

## 2018-12-16 LAB — LIPID PANEL
Cholesterol: 164 mg/dL (ref <200)
HDL: 37 mg/dL — ABNORMAL LOW (ref >=40)
LDL Cholesterol (Calc): 100 mg/dL (calc) — ABNORMAL HIGH
Non-HDL Cholesterol (Calc): 127 mg/dL (calc) (ref <130)
Total CHOL/HDL Ratio: 4.4 (calc) (ref <5.0)
Triglycerides: 167 mg/dL — ABNORMAL HIGH (ref <150)

## 2018-12-16 LAB — CK: Total CK: 68 U/L (ref 44–196)

## 2018-12-19 ENCOUNTER — Other Ambulatory Visit: Payer: Self-pay | Admitting: Nurse Practitioner

## 2018-12-19 DIAGNOSIS — K219 Gastro-esophageal reflux disease without esophagitis: Secondary | ICD-10-CM

## 2018-12-23 ENCOUNTER — Other Ambulatory Visit: Payer: Self-pay | Admitting: Nurse Practitioner

## 2018-12-23 DIAGNOSIS — I10 Essential (primary) hypertension: Secondary | ICD-10-CM

## 2018-12-27 DIAGNOSIS — G4733 Obstructive sleep apnea (adult) (pediatric): Secondary | ICD-10-CM | POA: Diagnosis not present

## 2019-01-12 ENCOUNTER — Other Ambulatory Visit: Payer: Self-pay | Admitting: Nurse Practitioner

## 2019-01-12 DIAGNOSIS — I10 Essential (primary) hypertension: Secondary | ICD-10-CM

## 2019-01-16 ENCOUNTER — Other Ambulatory Visit: Payer: Self-pay | Admitting: Family Medicine

## 2019-01-16 DIAGNOSIS — J301 Allergic rhinitis due to pollen: Secondary | ICD-10-CM

## 2019-01-23 DIAGNOSIS — G4733 Obstructive sleep apnea (adult) (pediatric): Secondary | ICD-10-CM | POA: Diagnosis not present

## 2019-01-31 ENCOUNTER — Ambulatory Visit (INDEPENDENT_AMBULATORY_CARE_PROVIDER_SITE_OTHER): Payer: Medicaid Other | Admitting: Nurse Practitioner

## 2019-01-31 ENCOUNTER — Encounter: Payer: Self-pay | Admitting: Nurse Practitioner

## 2019-01-31 ENCOUNTER — Other Ambulatory Visit: Payer: Self-pay

## 2019-01-31 VITALS — BP 113/74 | HR 73 | Temp 99.3°F | Resp 18 | Ht 65.0 in | Wt 217.8 lb

## 2019-01-31 DIAGNOSIS — E7849 Other hyperlipidemia: Secondary | ICD-10-CM

## 2019-01-31 DIAGNOSIS — I10 Essential (primary) hypertension: Secondary | ICD-10-CM

## 2019-01-31 DIAGNOSIS — F4010 Social phobia, unspecified: Secondary | ICD-10-CM

## 2019-01-31 DIAGNOSIS — I252 Old myocardial infarction: Secondary | ICD-10-CM | POA: Diagnosis not present

## 2019-01-31 DIAGNOSIS — Z23 Encounter for immunization: Secondary | ICD-10-CM | POA: Diagnosis not present

## 2019-01-31 DIAGNOSIS — E119 Type 2 diabetes mellitus without complications: Secondary | ICD-10-CM | POA: Diagnosis not present

## 2019-01-31 LAB — POCT GLYCOSYLATED HEMOGLOBIN (HGB A1C): Hemoglobin A1C: 6 % — AB (ref 4.0–5.6)

## 2019-01-31 MED ORDER — ISOSORBIDE MONONITRATE ER 120 MG PO TB24: 120.0000 mg | ORAL_TABLET | Freq: Every day | ORAL | 1 refills | Status: DC

## 2019-01-31 NOTE — Patient Instructions (Addendum)
Denice Paradise,   Thank you for coming in to clinic today.  1. Continue all medications without changes today.  It is okay to keep taking isosorbide mononitrate only as daily dosing.  Please schedule a follow-up appointment. Return in about 6 months (around 08/01/2019) for diabetes, hypertension.  If you have any other questions or concerns, please feel free to call the clinic or send a message through MyChart. You may also schedule an earlier appointment if necessary.  You will receive a survey after today's visit either digitally by e-mail or paper by [employer]. Your experiences and feedback matter to [place].  Please respond so we know how we are doing as we provide care for you.  [name], DNP, AGNP-BC Adult Gerontology Nurse Practitioner [facility], [facility]  Influenza (Flu) Vaccine (Inactivated or Recombinant): What You Need to Know 1. Why get vaccinated? Influenza vaccine can prevent influenza (flu). Flu is a contagious disease that spreads around the [place] every year, usually between October and May. Anyone can get the flu, but it is more dangerous for some people. Infants and young children, people 35 years of age and older, pregnant women, and people with certain health conditions or a weakened immune system are at greatest risk of flu complications. Pneumonia, bronchitis, sinus infections and ear infections are examples of flu-related complications. If you have a medical condition, such as heart disease, cancer or diabetes, flu can make it worse. Flu can cause fever and chills, sore throat, muscle aches, fatigue, cough, headache, and runny or stuffy nose. Some people may have vomiting and diarrhea, though this is more common in children than adults. Each year thousands of people in the [place] die from flu, and many more are hospitalized. Flu vaccine prevents millions of illnesses and flu-related visits to the doctor each year. 2. Influenza  vaccine CDC recommends everyone 46 months of age and older get vaccinated every flu season. Children 6 months through 11 years of age may need 2 doses during a single flu season. Everyone else needs only 1 dose each flu season. It takes about 2 weeks for protection to develop after vaccination. There are many flu viruses, and they are always changing. Each year a new flu vaccine is made to protect against three or four viruses that are likely to cause disease in the upcoming flu season. Even when the vaccine doesn't exactly match these viruses, it may still provide some protection. Influenza vaccine does not cause flu. Influenza vaccine may be given at the same time as other vaccines. 3. Talk with your health care provider Tell your vaccine provider if the person getting the vaccine:  Has had an allergic reaction after a previous dose of influenza vaccine, or has any severe, life-threatening allergies.  Has ever had Guillain-Barr Syndrome (also called GBS). In some cases, your health care provider may decide to postpone influenza vaccination to a future visit. People with minor illnesses, such as a cold, may be vaccinated. People who are moderately or severely ill should usually wait until they recover before getting influenza vaccine. Your health care provider can give you more information. 4. Risks of a vaccine reaction  Soreness, redness, and swelling where shot is given, fever, muscle aches, and headache can happen after influenza vaccine.  There may be a very small increased risk of Guillain-Barr Syndrome (GBS) after inactivated influenza vaccine (the flu shot). Young children who get the flu shot along with pneumococcal vaccine (PCV13), and/or DTaP vaccine at the  same time might be slightly more likely to have a seizure caused by fever. Tell your health care provider if a child who is getting flu vaccine has ever had a seizure. People sometimes faint after medical procedures, including  vaccination. Tell your provider if you feel dizzy or have vision changes or ringing in the ears. As with any medicine, there is a very remote chance of a vaccine causing a severe allergic reaction, other serious injury, or death. 5. What if there is a serious problem? An allergic reaction could occur after the vaccinated person leaves the clinic. If you see signs of a severe allergic reaction (hives, swelling of the face and throat, difficulty breathing, a fast heartbeat, dizziness, or weakness), call 9-1-1 and get the person to the nearest hospital. For other signs that concern you, call your health care provider. Adverse reactions should be reported to the Vaccine Adverse Event Reporting System (VAERS). Your health care provider will usually file this report, or you can do it yourself. Visit the VAERS website at www.vaers.LAgents.no or call 657-629-0136.VAERS is only for reporting reactions, and VAERS staff do not give medical advice. 6. The National Vaccine Injury Compensation Program The [employer] Vaccine Injury Compensation Program (VICP) is a federal program that was created to compensate people who may have been injured by certain vaccines. Visit the VICP website at SpiritualWord.at or call 518-383-1998 to learn about the program and about filing a claim. There is a time limit to file a claim for compensation. 7. How can I learn more?  Ask your healthcare provider.  Call your local or state health department.  Contact the Centers for Disease Control and Prevention (CDC): ? Call 708-078-2252 (1-800-CDC-INFO) or ? Visit CDC's BiotechRoom.com.cy Vaccine Information Statement (Interim) Inactivated Influenza Vaccine (12/02/2017) This information is not intended to replace advice given to you by your health care provider. Make sure you discuss any questions you have with your health care provider. Document Released: 01/29/2006 Document Revised: 07/26/2018 Document Reviewed:  12/06/2017 Elsevier Patient Education  2020 [employer]. https://www.cdc.gov/vaccines/hcp/vis/vis-statements/tdap.pdf">  Tdap Vaccine (Tetanus, Diphtheria and Pertussis): What You Need to Know 1. Why get vaccinated? Tetanus, diphtheria and pertussis are very serious diseases. Tdap vaccine can protect [place] from these diseases. And, Tdap vaccine given to pregnant women can protect newborn babies against pertussis.Marland Kitchen TETANUS (Lockjaw) is rare in the [place] today. It causes painful muscle tightening and stiffness, usually all over the body.  It can lead to tightening of muscles in the head and neck so you can't open your mouth, swallow, or sometimes even breathe. Tetanus kills about 1 out of 10 people who are infected even after receiving the best medical care. DIPHTHERIA is also rare in the [place] today. It can cause a thick coating to form in the back of the throat.  It can lead to breathing problems, heart failure, paralysis, and death. PERTUSSIS (Whooping Cough) causes severe coughing spells, which can cause difficulty breathing, vomiting and disturbed sleep.  It can also lead to weight loss, incontinence, and rib fractures. Up to 2 in 100 adolescents and 5 in 100 adults with pertussis are hospitalized or have complications, which could include pneumonia or death. These diseases are caused by bacteria. Diphtheria and pertussis are spread from person to person through secretions from coughing or sneezing. Tetanus enters the body through cuts, scratches, or wounds. Before vaccines, as many as 200,000 cases of diphtheria, 200,000 cases of pertussis, and hundreds of cases of tetanus, were reported in the [place]  each year. Since vaccination began, reports of cases for tetanus and diphtheria have dropped by about 99% and for pertussis by about 80%. 2. Tdap vaccine Tdap vaccine can protect adolescents and adults from tetanus, diphtheria, and pertussis. One dose of Tdap is routinely  given at age 47 or 4112. People who did not get Tdap at that age should get it as soon as possible. Tdap is especially important for healthcare professionals and anyone having close contact with a baby younger than 12 months. Pregnant women should get a dose of Tdap during every pregnancy, to protect the newborn from pertussis. Infants are most at risk for severe, life-threatening complications from pertussis. Another vaccine, called Td, protects against tetanus and diphtheria, but not pertussis. A Td booster should be given every 10 years. Tdap may be given as one of these boosters if you have never gotten Tdap before. Tdap may also be given after a severe cut or burn to prevent tetanus infection. Your doctor or the person giving you the vaccine can give you more information. Tdap may safely be given at the same time as other vaccines. 3. Some people should not get this vaccine  A person who has ever had a life-threatening allergic reaction after a previous dose of any diphtheria, tetanus or pertussis containing vaccine, OR has a severe allergy to any part of this vaccine, should not get Tdap vaccine. Tell the person giving the vaccine about any severe allergies.  Anyone who had coma or long repeated seizures within 7 days after a childhood dose of DTP or DTaP, or a previous dose of Tdap, should not get Tdap, unless a cause other than the vaccine was found. They can still get Td.  Talk to your doctor if you: ? have seizures or another nervous system problem, ? had severe pain or swelling after any vaccine containing diphtheria, tetanus or pertussis, ? ever had a condition called Guillain-Barr Syndrome (GBS), ? aren't feeling well on the day the shot is scheduled. 4. Risks With any medicine, including vaccines, there is a chance of side effects. These are usually mild and go away on their own. Serious reactions are also possible but are rare. Most people who get Tdap vaccine do not have any  problems with it. Mild problems following Tdap (Did not interfere with activities)  Pain where the shot was given (about 3 in 4 adolescents or 2 in 3 adults)  Redness or swelling where the shot was given (about 1 person in 5)  Mild fever of at least 100.58F (up to about 1 in 25 adolescents or 1 in 100 adults)  Headache (about 3 or 4 people in 10)  Tiredness (about 1 person in 3 or 4)  Nausea, vomiting, diarrhea, stomach ache (up to 1 in 4 adolescents or 1 in 10 adults)  Chills, sore joints (about 1 person in 10)  Body aches (about 1 person in 3 or 4)  Rash, swollen glands (uncommon) Moderate problems following Tdap (Interfered with activities, but did not require medical attention)  Pain where the shot was given (up to 1 in 5 or 6)  Redness or swelling where the shot was given (up to about 1 in 16 adolescents or 1 in 12 adults)  Fever over 102F (about 1 in 100 adolescents or 1 in 250 adults)  Headache (about 1 in 7 adolescents or 1 in 10 adults)  Nausea, vomiting, diarrhea, stomach ache (up to 1 or 3 people in 100)  Swelling of the entire arm  where the shot was given (up to about 1 in 500). Severe problems following Tdap (Unable to perform usual activities; required medical attention)  Swelling, severe pain, bleeding and redness in the arm where the shot was given (rare). Problems that could happen after any vaccine:  People sometimes faint after a medical procedure, including vaccination. Sitting or lying down for about 15 minutes can help prevent fainting, and injuries caused by a fall. Tell your doctor if you feel dizzy, or have vision changes or ringing in the ears.  Some people get severe pain in the shoulder and have difficulty moving the arm where a shot was given. This happens very rarely.  Any medication can cause a severe allergic reaction. Such reactions from a vaccine are very rare, estimated at fewer than 1 in a million doses, and would happen within a few  minutes to a few hours after the vaccination. As with any medicine, there is a very remote chance of a vaccine causing a serious injury or death. The safety of vaccines is always being monitored. For more information, visit: http://www.aguilar.org/ 5. What if there is a serious problem? What should I look for?  Look for anything that concerns you, such as signs of a severe allergic reaction, very high fever, or unusual behavior. Signs of a severe allergic reaction can include hives, swelling of the face and throat, difficulty breathing, a fast heartbeat, dizziness, and weakness. These would usually start a few minutes to a few hours after the vaccination. What should I do?  If you think it is a severe allergic reaction or other emergency that can't wait, call 9-1-1 or get the person to the nearest hospital. Otherwise, call your doctor.  Afterward, the reaction should be reported to the Vaccine Adverse Event Reporting System (VAERS). Your doctor might file this report, or you can do it yourself through the VAERS web site at www.vaers.SamedayNews.es, or by calling 319-291-3391. VAERS does not give medical advice. 6. The National Vaccine Injury Compensation Program The [employer] Vaccine Injury Compensation Program (VICP) is a federal program that was created to compensate people who may have been injured by certain vaccines. Persons who believe they may have been injured by a vaccine can learn about the program and about filing a claim by calling 859 173 9001 or visiting the [place] website at GoldCloset.com.ee. There is a time limit to file a claim for compensation. 7. How can I learn more?  Ask your doctor. He or she can give you the vaccine package insert or suggest other sources of information.  Call your local or state health department.  Contact the Centers for Disease Control and Prevention (CDC): ? Call 779-775-6832 (1-800-CDC-INFO) or ? Visit CDC's website at  http://hunter.com/ Vaccine Information Statement Tdap Vaccine (06/13/2013) This information is not intended to replace advice given to you by your health care provider. Make sure you discuss any questions you have with your health care provider. Document Released: 10/06/2011 Document Revised: 11/22/2017 Document Reviewed: 11/22/2017 Elsevier Interactive Patient Education  [employer].

## 2019-01-31 NOTE — Progress Notes (Signed)
Subjective:    Patient ID: Calvin Ortiz, male    DOB: Oct 31, 1970, 48 y.o.   MRN: [id number]  Calvin Ortiz is a 48 y.o. male presenting on 01/31/2019 for Hypertension   HPI Hypertension - He is checking BP at home or outside of clinic.  Readings 116/74 - 136/78.  Higher with pain. - Current medications are tolerated well without side effects - He is not currently symptomatic. - Pt denies headache, lightheadedness, dizziness, changes in vision, chest tightness/pressure, palpitations, leg swelling, sudden loss of speech or loss of consciousness. - He  reports no regular exercise routine. - His diet is low in salt, low in fat, and moderate in carbohydrates.  Diabetes Pt presents today for follow up of Type 2 diabetes mellitus.  He is not checking CBG at home - Current diabetic medications include: diet and lifestyle controlled - He is not currently symptomatic.  - He denies polydipsia, polyphagia, polyuria, headaches, diaphoresis, shakiness, chills, pain, numbness or tingling in extremities and changes in vision.   - Clinical course has been stable.  PREVENTION: Eye exam current (within one year): no Foot exam current (within one year): no  Lipid/ASCVD risk reduction - on statin: yes Kidney protection - on ace or arb: yes Recent Labs    04/18/18 1338 01/31/19 1436  HGBA1C 5.4 6.0*    Patient continues to be stopped from smoking- mother smokes in the home.  Social History   Tobacco Use  . Smoking status: Former Smoker    Packs/day: 0.00    Years: 25.00    Pack years: 0.00    Types: Cigarettes    Quit date: 04/13/2018    Years since quitting: 0.8  . Smokeless tobacco: Never Used  . Tobacco comment: Has quit off and on about 5-6 times (09/2017)  Substance Use Topics  . Alcohol use: No  . Drug use: No    Review of Systems Per HPI unless specifically indicated above     Objective:    BP 113/74 (BP Location: Left Arm, Patient Position: Sitting, Cuff  Size: Normal)   Pulse 73   Temp 99.3 F (37.4 C) (Oral)   Resp 18   Ht 5\' 5"  (1.651 m)   Wt 217 lb 12.8 oz (98.8 kg)   SpO2 98%   BMI 36.24 kg/m   Wt Readings from Last 3 Encounters:  01/31/19 217 lb 12.8 oz (98.8 kg)  04/18/18 212 lb (96.2 kg)  01/10/18 226 lb (102.5 kg)    Physical Exam Vitals signs reviewed.  Constitutional:      General: He is not in acute distress.    Appearance: Normal appearance. He is well-developed. He is obese.  HENT:     Head: Normocephalic and atraumatic.  Neck:     Musculoskeletal: Normal range of motion and neck supple.  Cardiovascular:     Rate and Rhythm: Normal rate and regular rhythm.     Pulses: Normal pulses.     Heart sounds: Normal heart sounds. No murmur. No friction rub. No gallop.   Pulmonary:     Effort: Pulmonary effort is normal.     Breath sounds: Normal breath sounds.  Skin:    General: Skin is warm and dry.     Capillary Refill: Capillary refill takes less than 2 seconds.  Neurological:     General: No focal deficit present.     Mental Status: He is alert and oriented to person, place, and time. Mental status is at baseline.  Psychiatric:  Mood and Affect: Mood normal.        Behavior: Behavior normal.        Thought Content: Thought content normal.        Judgment: Judgment normal.    Results for orders placed or performed in visit on 01/31/19  POCT glycosylated hemoglobin (Hb A1C)  Result Value Ref Range   Hemoglobin A1C 6.0 (A) 4.0 - 5.6 %   HbA1c POC (<> result, manual entry)     HbA1c, POC (prediabetic range)     HbA1c, POC (controlled diabetic range)        Assessment & Plan:   Problem List Items Addressed This Visit      Cardiovascular and Mediastinum   Hypertension Controlled hypertension.  BP goal < 130/80.  Pt is working on lifestyle modifications.  Taking medications tolerating well without side effects. No currently identified complications.  Plan: 1. Continue taking medications without  changes.  Refills provided 2. Obtain labs  3. Encouraged heart healthy diet and increasing exercise to 30 minutes most days of the week. 4. Check BP 1-2 x per week at home, keep log, and bring to clinic at next appointment. 5. Follow up 6 months.     Relevant Medications   isosorbide mononitrate (IMDUR) 120 MG 24 hr tablet     Endocrine   Diabetes mellitus without complication (HCC) - Primary Well-controlledDM with A1c stable from last visit and goal A1c < 7.0%. - No known complications or hypoglycemia.  Plan:  1. Continue current therapy   2. Encourage improved lifestyle: - low carb/low glycemic diet reinforced prior education - Increase physical activity to 30 minutes most days of the week.  Explained that increased physical activity increases body's use of sugar for energy. 3. Check fasting am CBG and bring log to next visit for review 4. Continue ASA, ACEi and Statin 5. Advised to schedule DM ophtho exam, send record. 6. Follow-up 6 months     Relevant Orders   POCT glycosylated hemoglobin (Hb A1C) (Completed)    Other Visit Diagnoses    Needs flu shot       Relevant Orders   Flu Vaccine QUAD 6+ mos PF IM (Fluarix Quad PF) (Completed)   Need for Tdap vaccination       Relevant Orders   Tdap vaccine greater than or equal to 7yo IM (Completed)       Meds ordered this encounter  Medications  . isosorbide mononitrate (IMDUR) 120 MG 24 hr tablet    Sig: Take 1 tablet (120 mg total) by mouth daily.    Dispense:  90 tablet    Refill:  1    Fill only with patient request.  Keep on file.    Order Specific Question:   Supervising Provider    Answer:   Smitty Cords [[id number]]  . atorvastatin (LIPITOR) 40 MG tablet    Sig: Take 1 tablet (40 mg total) by mouth daily. *NEED APPT*    Dispense:  90 tablet    Refill:  0    Order Specific Question:   Supervising Provider    Answer:   Smitty Cords [[id number]]  . carvedilol (COREG) 25 MG tablet    Sig: Take 1 tablet  (25 mg total) by mouth 2 (two) times daily with a meal.    Dispense:  180 tablet    Refill:  1    Order Specific Question:   Supervising Provider    Answer:   Smitty Cords [[id number]]  .  fenofibrate (TRICOR) 145 MG tablet    Sig: Take 1 tablet (145 mg total) by mouth daily.    Dispense:  90 tablet    Refill:  1    Order Specific Question:   Supervising Provider    Answer:   Smitty CordsKARAMALEGOS, ALEXANDER J [[id number]]  . FLUoxetine (PROZAC) 20 MG capsule    Sig: Take 1 capsule (20 mg total) by mouth daily.    Dispense:  90 capsule    Refill:  1    Order Specific Question:   Supervising Provider    Answer:   Smitty CordsKARAMALEGOS, ALEXANDER J [[id number]]   Follow up plan: Return in about 6 months (around 08/01/2019) for diabetes, hypertension.  Wilhelmina McardleLauren Honora Searson, DNP, AGPCNP-BC Adult Gerontology Primary Care Nurse Practitioner [facility] 01/31/2019, 2:34 PM

## 2019-02-08 ENCOUNTER — Encounter: Payer: Self-pay | Admitting: Nurse Practitioner

## 2019-02-08 MED ORDER — FLUOXETINE HCL 20 MG PO CAPS: 20.0000 mg | ORAL_CAPSULE | Freq: Every day | ORAL | 1 refills | Status: DC

## 2019-02-08 MED ORDER — FENOFIBRATE 145 MG PO TABS: 145.0000 mg | ORAL_TABLET | Freq: Every day | ORAL | 1 refills | Status: DC

## 2019-02-08 MED ORDER — ATORVASTATIN CALCIUM 40 MG PO TABS: 40.0000 mg | ORAL_TABLET | Freq: Every day | ORAL | 0 refills | Status: DC

## 2019-02-08 MED ORDER — CARVEDILOL 25 MG PO TABS: 25.0000 mg | ORAL_TABLET | Freq: Two times a day (BID) | ORAL | 1 refills | Status: DC

## 2019-02-17 ENCOUNTER — Other Ambulatory Visit: Payer: Self-pay | Admitting: Nurse Practitioner

## 2019-02-17 DIAGNOSIS — M545 Low back pain, unspecified: Secondary | ICD-10-CM

## 2019-02-27 DIAGNOSIS — G4733 Obstructive sleep apnea (adult) (pediatric): Secondary | ICD-10-CM | POA: Diagnosis not present

## 2019-03-31 DIAGNOSIS — G4733 Obstructive sleep apnea (adult) (pediatric): Secondary | ICD-10-CM | POA: Diagnosis not present

## 2019-05-02 DIAGNOSIS — G4733 Obstructive sleep apnea (adult) (pediatric): Secondary | ICD-10-CM | POA: Diagnosis not present

## 2019-05-12 ENCOUNTER — Other Ambulatory Visit: Payer: Self-pay | Admitting: Family Medicine

## 2019-05-12 DIAGNOSIS — M545 Low back pain, unspecified: Secondary | ICD-10-CM

## 2019-05-16 ENCOUNTER — Other Ambulatory Visit: Payer: Self-pay | Admitting: Nurse Practitioner

## 2019-05-16 DIAGNOSIS — J301 Allergic rhinitis due to pollen: Secondary | ICD-10-CM

## 2019-06-08 DIAGNOSIS — G4733 Obstructive sleep apnea (adult) (pediatric): Secondary | ICD-10-CM | POA: Diagnosis not present

## 2019-06-14 ENCOUNTER — Other Ambulatory Visit: Payer: Self-pay

## 2019-06-14 DIAGNOSIS — K219 Gastro-esophageal reflux disease without esophagitis: Secondary | ICD-10-CM

## 2019-06-14 MED ORDER — PANTOPRAZOLE SODIUM 20 MG PO TBEC: 20.0000 mg | DELAYED_RELEASE_TABLET | Freq: Every day | ORAL | 0 refills | Status: DC

## 2019-06-19 ENCOUNTER — Other Ambulatory Visit: Payer: Self-pay

## 2019-06-19 DIAGNOSIS — F4010 Social phobia, unspecified: Secondary | ICD-10-CM

## 2019-06-19 MED ORDER — FLUOXETINE HCL 20 MG PO CAPS: 20.0000 mg | ORAL_CAPSULE | Freq: Every day | ORAL | 0 refills | Status: DC

## 2019-07-25 ENCOUNTER — Other Ambulatory Visit: Payer: Self-pay | Admitting: Family Medicine

## 2019-07-25 DIAGNOSIS — M545 Low back pain, unspecified: Secondary | ICD-10-CM

## 2019-07-26 NOTE — Telephone Encounter (Signed)
Looks like this Rx was written back in January 2021.  It should be a PRN medication, but I don't see an office visit or note associated with the Rx.  Can he schedule a visit so we can assess and document it?  Thanks

## 2019-07-29 ENCOUNTER — Other Ambulatory Visit: Payer: Self-pay | Admitting: Nurse Practitioner

## 2019-07-29 DIAGNOSIS — I10 Essential (primary) hypertension: Secondary | ICD-10-CM

## 2019-08-02 NOTE — Telephone Encounter (Signed)
Patient has appointment tomorrow- RF per protocol

## 2019-08-03 ENCOUNTER — Ambulatory Visit: Payer: Medicaid Other | Admitting: Family Medicine

## 2019-08-03 ENCOUNTER — Encounter: Payer: Self-pay | Admitting: Family Medicine

## 2019-08-03 ENCOUNTER — Other Ambulatory Visit: Payer: Self-pay

## 2019-08-03 VITALS — BP 109/61 | HR 58 | Temp 97.3°F | Ht 65.0 in | Wt 216.4 lb

## 2019-08-03 DIAGNOSIS — I10 Essential (primary) hypertension: Secondary | ICD-10-CM

## 2019-08-03 DIAGNOSIS — E7849 Other hyperlipidemia: Secondary | ICD-10-CM | POA: Diagnosis not present

## 2019-08-03 DIAGNOSIS — Z6836 Body mass index (BMI) 36.0-36.9, adult: Secondary | ICD-10-CM | POA: Diagnosis not present

## 2019-08-03 DIAGNOSIS — M545 Low back pain, unspecified: Secondary | ICD-10-CM

## 2019-08-03 DIAGNOSIS — Z1329 Encounter for screening for other suspected endocrine disorder: Secondary | ICD-10-CM | POA: Diagnosis not present

## 2019-08-03 DIAGNOSIS — E782 Mixed hyperlipidemia: Secondary | ICD-10-CM

## 2019-08-03 DIAGNOSIS — J302 Other seasonal allergic rhinitis: Secondary | ICD-10-CM | POA: Diagnosis not present

## 2019-08-03 DIAGNOSIS — K219 Gastro-esophageal reflux disease without esophagitis: Secondary | ICD-10-CM

## 2019-08-03 DIAGNOSIS — E119 Type 2 diabetes mellitus without complications: Secondary | ICD-10-CM | POA: Diagnosis not present

## 2019-08-03 DIAGNOSIS — J301 Allergic rhinitis due to pollen: Secondary | ICD-10-CM

## 2019-08-03 DIAGNOSIS — I252 Old myocardial infarction: Secondary | ICD-10-CM

## 2019-08-03 DIAGNOSIS — F4010 Social phobia, unspecified: Secondary | ICD-10-CM | POA: Diagnosis not present

## 2019-08-03 DIAGNOSIS — R635 Abnormal weight gain: Secondary | ICD-10-CM | POA: Diagnosis not present

## 2019-08-03 DIAGNOSIS — I25118 Atherosclerotic heart disease of native coronary artery with other forms of angina pectoris: Secondary | ICD-10-CM

## 2019-08-03 LAB — POCT URINALYSIS DIPSTICK
Bilirubin, UA: NEGATIVE
Blood, UA: NEGATIVE
Glucose, UA: NEGATIVE
Ketones, UA: NEGATIVE
Leukocytes, UA: NEGATIVE
Nitrite, UA: NEGATIVE
Protein, UA: NEGATIVE
Spec Grav, UA: 1.02 (ref 1.010–1.025)
Urobilinogen, UA: 0.2 E.U./dL
pH, UA: 5 (ref 5.0–8.0)

## 2019-08-03 LAB — POCT UA - MICROALBUMIN: Microalbumin Ur, POC: 20 mg/L

## 2019-08-03 LAB — POCT GLYCOSYLATED HEMOGLOBIN (HGB A1C): Hemoglobin A1C: 5.6 % (ref 4.0–5.6)

## 2019-08-03 MED ORDER — NITROGLYCERIN 0.4 MG SL SUBL: 0.4000 mg | SUBLINGUAL_TABLET | SUBLINGUAL | 3 refills | Status: DC | PRN

## 2019-08-03 MED ORDER — CARVEDILOL 25 MG PO TABS: 25.0000 mg | ORAL_TABLET | Freq: Two times a day (BID) | ORAL | 1 refills | Status: DC

## 2019-08-03 MED ORDER — FLUOXETINE HCL 20 MG PO CAPS: 20.0000 mg | ORAL_CAPSULE | Freq: Every day | ORAL | 1 refills | Status: DC

## 2019-08-03 MED ORDER — BACLOFEN 10 MG PO TABS: ORAL_TABLET | ORAL | 2 refills | Status: DC

## 2019-08-03 MED ORDER — ATORVASTATIN CALCIUM 40 MG PO TABS: 40.0000 mg | ORAL_TABLET | Freq: Every day | ORAL | 0 refills | Status: DC

## 2019-08-03 MED ORDER — FEXOFENADINE HCL 180 MG PO TABS: 180.0000 mg | ORAL_TABLET | Freq: Every day | ORAL | 3 refills | Status: AC

## 2019-08-03 MED ORDER — HYDROXYZINE HCL 25 MG PO TABS: ORAL_TABLET | ORAL | 5 refills | Status: DC

## 2019-08-03 MED ORDER — AMLODIPINE BESYLATE 10 MG PO TABS: ORAL_TABLET | ORAL | 1 refills | Status: DC

## 2019-08-03 MED ORDER — BENAZEPRIL HCL 40 MG PO TABS: 40.0000 mg | ORAL_TABLET | Freq: Every day | ORAL | 3 refills | Status: DC

## 2019-08-03 MED ORDER — PANTOPRAZOLE SODIUM 20 MG PO TBEC: 20.0000 mg | DELAYED_RELEASE_TABLET | Freq: Every day | ORAL | 1 refills | Status: DC

## 2019-08-03 MED ORDER — ISOSORBIDE MONONITRATE ER 120 MG PO TB24: 120.0000 mg | ORAL_TABLET | Freq: Every day | ORAL | 1 refills | Status: DC

## 2019-08-03 MED ORDER — FENOFIBRATE 145 MG PO TABS: 145.0000 mg | ORAL_TABLET | Freq: Every day | ORAL | 1 refills | Status: DC

## 2019-08-03 MED ORDER — FLUTICASONE PROPIONATE 50 MCG/ACT NA SUSP: NASAL | 3 refills | Status: DC

## 2019-08-03 NOTE — Progress Notes (Signed)
Subjective:    Patient ID: Calvin Ortiz, male    DOB: [birth year], 49 y.o.   MRN: [id number]  Calvin Ortiz is a 49 y.o. male presenting on 08/03/2019 for Diabetes (refill on medications)   HPI  Diabetes Pt presents today for follow up Type 2 Diabetes Mellitus.  He/she (caps): He is not checking AM CBG at home. -ACTION; IS/IS NOT: is not currently symptomatic -Actions; denies/reports/admits to: denies polydipsia, polyphagia, polyuria, headaches, diaphoresis, shakiness, chills, pain, numbness or tingling in extremities or changes in vision -Clinical course has been stable -Reports exercise routine -Diet is moderate in salt, moderate in fat, and moderate in carbohydrates  PREVENTION Eye exam current (within 1 year) DUE Foot exam current (within 1 year) Completed today Lipid/ASCVD risk reduction - on statin: YES/NO: Yes  Kidney Protection (On ACE/ARB)? YES/NO: Yes   Hypertension - He is checking BP at home or outside of clinic.  Readings 100-110's/60-70's - Current medications: amlodipine 10mg  daily, benzaepril 40mg  daily, carvedilol 25mg  twice daily, and imdur 120mg  daily, tolerating well without side effects - He is not currently symptomatic. - Pt denies headache, lightheadedness, dizziness, changes in vision, chest tightness/pressure, palpitations, leg swelling, sudden loss of speech or loss of consciousness. - He  reports regular exercise routine.. - His diet is moderate in salt, moderate in fat, and moderate in carbohydrates.   Depression screen [facility] 2/9 01/31/2019 10/11/2017 10/11/2017  Decreased Interest 1 1 1   Down, Depressed, Hopeless 0 0 0  PHQ - 2 Score 1 1 1   Altered sleeping 2 1 1   Tired, decreased energy 0 1 1  Change in appetite 0 1 1  Feeling bad or failure about yourself  0 0 0  Trouble concentrating 0 0 0  Moving slowly or fidgety/restless 0 0 0  Suicidal thoughts 0 0 0  PHQ-9 Score 3 4 4   Difficult doing work/chores Somewhat difficult Not  difficult at all Not difficult at all    Social History   Tobacco Use  . Smoking status: Former Smoker    Packs/day: 0.00    Years: 25.00    Pack years: 0.00    Types: Cigarettes    Quit date: 04/13/2018    Years since quitting: 1.3  . Smokeless tobacco: Never Used  . Tobacco comment: Has quit off and on about 5-6 times (09/2017)  Substance Use Topics  . Alcohol use: No  . Drug use: No    Review of Systems  Constitutional: Negative.   HENT: Negative.   Eyes: Negative.   Respiratory: Negative.   Cardiovascular: Negative.   Gastrointestinal: Negative.   Endocrine: Negative.   Genitourinary: Negative.   Musculoskeletal: Negative.   Skin: Negative.   Allergic/Immunologic: Negative.   Neurological: Negative.   Hematological: Negative.   Psychiatric/Behavioral: Negative.    Per HPI unless specifically indicated above     Objective:    BP 109/61   Pulse (!) 58   Temp (!) 97.3 F (36.3 C) (Temporal)   Ht 5\' 5"  (1.651 m)   Wt 216 lb 6.4 oz (98.2 kg)   SpO2 97%   BMI 36.01 kg/m   Wt Readings from Last 3 Encounters:  08/03/19 216 lb 6.4 oz (98.2 kg)  01/31/19 217 lb 12.8 oz (98.8 kg)  04/18/18 212 lb (96.2 kg)    Physical Exam Vitals reviewed.  Constitutional:      General: He is not in acute distress.    Appearance: Normal appearance. He is well-developed and well-groomed. He is  obese. He is not ill-appearing or toxic-appearing.  HENT:     Head: Normocephalic.  Eyes:     General: Lids are normal. Vision grossly intact.        Right eye: No discharge.        Left eye: No discharge.     Extraocular Movements: Extraocular movements intact.     Conjunctiva/sclera: Conjunctivae normal.     Pupils: Pupils are equal, round, and reactive to light.  Cardiovascular:     Rate and Rhythm: Normal rate and regular rhythm.     Pulses: Normal pulses.          Dorsalis pedis pulses are 2+ on the right side and 2+ on the left side.       Posterior tibial pulses are 2+ on  the right side and 2+ on the left side.     Heart sounds: Normal heart sounds. No murmur. No friction rub. No gallop.   Pulmonary:     Effort: Pulmonary effort is normal. No respiratory distress.     Breath sounds: Normal breath sounds.  Abdominal:     General: Abdomen is flat. Bowel sounds are normal. There is no distension.     Palpations: Abdomen is soft. There is no mass.     Tenderness: There is no abdominal tenderness. There is no guarding.  Feet:     Right foot:     Skin integrity: Skin integrity normal.     Left foot:     Skin integrity: Skin integrity normal.     Comments: Absent of callus and all other lesions.  Some dry skin noted, but pt reports regular foot care and can see bottom of his feet.  Skin:    General: Skin is warm and dry.     Capillary Refill: Capillary refill takes less than 2 seconds.  Neurological:     General: No focal deficit present.     Mental Status: He is alert and oriented to person, place, and time.     Cranial Nerves: No cranial nerve deficit.     Sensory: No sensory deficit.     Motor: No weakness.     Coordination: Coordination normal.     Gait: Gait normal.  Psychiatric:        Attention and Perception: Attention and perception normal.        Mood and Affect: Mood and affect normal.        Speech: Speech normal.        Behavior: Behavior normal. Behavior is cooperative.        Thought Content: Thought content normal.        Cognition and Memory: Cognition and memory normal.     Results for orders placed or performed in visit on 08/03/19  POCT Urinalysis Dipstick  Result Value Ref Range   Color, UA Yellow    Clarity, UA clear    Glucose, UA Negative Negative   Bilirubin, UA negative    Ketones, UA negative    Spec Grav, UA 1.020 1.010 - 1.025   Blood, UA negative    pH, UA 5.0 5.0 - 8.0   Protein, UA Negative Negative   Urobilinogen, UA 0.2 0.2 or 1.0 E.U./dL   Nitrite, UA negative    Leukocytes, UA Negative Negative    Appearance     Odor    POCT UA - Microalbumin  Result Value Ref Range   Microalbumin Ur, POC 20 mg/L   Creatinine, POC  Albumin/Creatinine Ratio, Urine, POC    POCT glycosylated hemoglobin (Hb A1C)  Result Value Ref Range   Hemoglobin A1C 5.6 4.0 - 5.6 %   HbA1c POC (<> result, manual entry)     HbA1c, POC (prediabetic range)     HbA1c, POC (controlled diabetic range)        Assessment & Plan:   Problem List Items Addressed This Visit      Cardiovascular and Mediastinum   Essential hypertension    Stable and well controlled hypertension.  BP is at goal < 130/80.  Pt is working on lifestyle modifications.  Taking medications tolerating well without side effects. No known complications.  Plan: 1. Continue current treatment plan 2. Obtain labs ordered today in next 1-2 weeks  3. Encouraged heart healthy diet and increasing exercise to 30 minutes most days of the week, going no more than 2 days in a row without exercise. 4. Check BP 1-2 x per week at home, keep log, and bring to clinic at next appointment. 5. Follow up 6 months.      Relevant Medications   amLODipine (NORVASC) 10 MG tablet   atorvastatin (LIPITOR) 40 MG tablet   benazepril (LOTENSIN) 40 MG tablet   carvedilol (COREG) 25 MG tablet   fenofibrate (TRICOR) 145 MG tablet   isosorbide mononitrate (IMDUR) 120 MG 24 hr tablet   nitroGLYCERIN (NITROSTAT) 0.4 MG SL tablet   Other Relevant Orders   CBC with Differential   COMPLETE METABOLIC PANEL WITH GFR     Respiratory   Seasonal allergic rhinitis due to pollen   Relevant Medications   fluticasone (FLONASE) 50 MCG/ACT nasal spray     Endocrine   Diabetes mellitus without complication (HCC) - Primary    Well-controlledDM with A1c stable and at goal A1c < 7.0%.  Plan:  1. Continue current therapy: diet controlled 2. Encourage improved lifestyle: - low carb/low glycemic diet reinforced prior education - Increase physical activity to 30 minutes most days of  the week.  Explained that increased physical activity increases body's use of sugar for energy. 3. Continue ASA, ACEi and Statin 4. DM Foot exam done today with no new findings.   and Advised to schedule DM ophtho exam, send record. 5. Follow-up 6 months      Relevant Medications   atorvastatin (LIPITOR) 40 MG tablet   benazepril (LOTENSIN) 40 MG tablet   hydrOXYzine (ATARAX/VISTARIL) 25 MG tablet   Other Relevant Orders   POCT Urinalysis Dipstick (Completed)   POCT UA - Microalbumin (Completed)   POCT glycosylated hemoglobin (Hb A1C) (Completed)   CBC with Differential   COMPLETE METABOLIC PANEL WITH GFR     Other   Mixed hyperlipidemia    Unknown control.  Currently taking fenofibrate 145mg  daily and atorvastatin 40mg  daily and tolerating it well.  Personal history of prior ASCVD events  Plan: 1) Labs ordered today and to complete in the next 1-2 weeks 2) Continue current regimen 3) Heart healthy diet and to exercise every other day for 30 minutes per day, going no more than 2 days in a row without exercise. 4) We will see you back in 6 months       Relevant Medications   amLODipine (NORVASC) 10 MG tablet   atorvastatin (LIPITOR) 40 MG tablet   benazepril (LOTENSIN) 40 MG tablet   carvedilol (COREG) 25 MG tablet   fenofibrate (TRICOR) 145 MG tablet   isosorbide mononitrate (IMDUR) 120 MG 24 hr tablet   nitroGLYCERIN (NITROSTAT) 0.4  MG SL tablet   Other Relevant Orders   Lipid Profile   Other hyperlipidemia   Relevant Medications   amLODipine (NORVASC) 10 MG tablet   atorvastatin (LIPITOR) 40 MG tablet   benazepril (LOTENSIN) 40 MG tablet   carvedilol (COREG) 25 MG tablet   fenofibrate (TRICOR) 145 MG tablet   isosorbide mononitrate (IMDUR) 120 MG 24 hr tablet   nitroGLYCERIN (NITROSTAT) 0.4 MG SL tablet   History of MI (myocardial infarction)   Relevant Medications   carvedilol (COREG) 25 MG tablet   Other Relevant Orders   Lipid Profile    Other Visit  Diagnoses    BMI 36.0-36.9,adult       Relevant Orders   Thyroid Panel With TSH   Screening for thyroid disorder       Relevant Orders   Thyroid Panel With TSH   Weight gain       Relevant Orders   Thyroid Panel With TSH   Acute bilateral low back pain without sciatica       Relevant Medications   baclofen (LIORESAL) 10 MG tablet   Hypertension, unspecified type       Relevant Medications   amLODipine (NORVASC) 10 MG tablet   atorvastatin (LIPITOR) 40 MG tablet   benazepril (LOTENSIN) 40 MG tablet   carvedilol (COREG) 25 MG tablet   fenofibrate (TRICOR) 145 MG tablet   isosorbide mononitrate (IMDUR) 120 MG 24 hr tablet   nitroGLYCERIN (NITROSTAT) 0.4 MG SL tablet   Seasonal allergic rhinitis       Relevant Medications   fexofenadine (ALLEGRA) 180 MG tablet   Social anxiety disorder       Relevant Medications   FLUoxetine (PROZAC) 20 MG capsule   hydrOXYzine (ATARAX/VISTARIL) 25 MG tablet   Coronary artery disease of native artery of native heart with stable angina pectoris (HCC)       Relevant Medications   amLODipine (NORVASC) 10 MG tablet   atorvastatin (LIPITOR) 40 MG tablet   baclofen (LIORESAL) 10 MG tablet   benazepril (LOTENSIN) 40 MG tablet   carvedilol (COREG) 25 MG tablet   fenofibrate (TRICOR) 145 MG tablet   FLUoxetine (PROZAC) 20 MG capsule   isosorbide mononitrate (IMDUR) 120 MG 24 hr tablet   nitroGLYCERIN (NITROSTAT) 0.4 MG SL tablet   Gastroesophageal reflux disease       Relevant Medications   pantoprazole (PROTONIX) 20 MG tablet      Meds ordered this encounter  Medications  . amLODipine (NORVASC) 10 MG tablet    Sig: TAKE 1 TABLET(10 MG) BY MOUTH DAILY    Dispense:  90 tablet    Refill:  1  . atorvastatin (LIPITOR) 40 MG tablet    Sig: Take 1 tablet (40 mg total) by mouth daily.    Dispense:  90 tablet    Refill:  0  . baclofen (LIORESAL) 10 MG tablet    Sig: TAKE 1/2 TO 1 TABLET(5 TO 10 MG) BY MOUTH AT BEDTIME AS NEEDED FOR MUSCLE SPASMS     Dispense:  30 tablet    Refill:  2  . benazepril (LOTENSIN) 40 MG tablet    Sig: Take 1 tablet (40 mg total) by mouth daily.    Dispense:  90 tablet    Refill:  3  . carvedilol (COREG) 25 MG tablet    Sig: Take 1 tablet (25 mg total) by mouth 2 (two) times daily with a meal.    Dispense:  180 tablet    Refill:  1  . fenofibrate (TRICOR) 145 MG tablet    Sig: Take 1 tablet (145 mg total) by mouth daily.    Dispense:  90 tablet    Refill:  1  . fexofenadine (ALLEGRA) 180 MG tablet    Sig: Take 1 tablet (180 mg total) by mouth daily.    Dispense:  90 tablet    Refill:  3  . FLUoxetine (PROZAC) 20 MG capsule    Sig: Take 1 capsule (20 mg total) by mouth daily.    Dispense:  90 capsule    Refill:  1  . fluticasone (FLONASE) 50 MCG/ACT nasal spray    Sig: SHAKE LIQUID AND USE 2 SPRAYS IN EACH NOSTRIL DAILY    Dispense:  16 g    Refill:  3  . isosorbide mononitrate (IMDUR) 120 MG 24 hr tablet    Sig: Take 1 tablet (120 mg total) by mouth daily.    Dispense:  90 tablet    Refill:  1    Fill only with patient request.  Keep on file.  . nitroGLYCERIN (NITROSTAT) 0.4 MG SL tablet    Sig: Place 1 tablet (0.4 mg total) under the tongue every 5 (five) minutes as needed for chest pain (Take up to 3 doses).    Dispense:  50 tablet    Refill:  3  . pantoprazole (PROTONIX) 20 MG tablet    Sig: Take 1 tablet (20 mg total) by mouth daily.    Dispense:  90 tablet    Refill:  1  . hydrOXYzine (ATARAX/VISTARIL) 25 MG tablet    Sig: TAKE 2 TABLETS BY MOUTH EVERY NIGHT AT BEDTIME. MAY ALSO TAKE 1/2- 1 TABLET 2 TIMES DAILY AS NEEDED FOR ANXIETY    Dispense:  120 tablet    Refill:  5      Follow up plan: Return in about 6 months (around 02/02/2020) for DM, HTN follow up.   Charlaine Dalton, FNP Family Nurse Practitioner [facility] 08/03/2019, 3:01 PM

## 2019-08-03 NOTE — Assessment & Plan Note (Signed)
Well-controlledDM with A1c stable and at goal A1c < 7.0%.  Plan:  1. Continue current therapy: diet controlled 2. Encourage improved lifestyle: - low carb/low glycemic diet reinforced prior education - Increase physical activity to 30 minutes most days of the week.  Explained that increased physical activity increases body's use of sugar for energy. 3. Continue ASA, ACEi and Statin 4. DM Foot exam done today with no new findings.   and Advised to schedule DM ophtho exam, send record. 5. Follow-up 6 months

## 2019-08-03 NOTE — Assessment & Plan Note (Signed)
Unknown control.  Currently taking fenofibrate 145mg  daily and atorvastatin 40mg  daily and tolerating it well.  Personal history of prior ASCVD events  Plan: 1) Labs ordered today and to complete in the next 1-2 weeks 2) Continue current regimen 3) Heart healthy diet and to exercise every other day for 30 minutes per day, going no more than 2 days in a row without exercise. 4) We will see you back in 6 months

## 2019-08-03 NOTE — Patient Instructions (Signed)
As we discussed, have your labs drawn in the next 1-2 weeks and we will contact you with the results.  Your medication refills have been sent to your pharmacy on file.  Try to get exercise a minimum of 30 minutes per day at least 5 days per week as well as  adequate water intake all while measuring blood pressure a few times per week.  Keep a blood pressure log and bring back to clinic at your next visit.  If your readings are consistently over 140/90 to contact our office/send me a MyChart message and we will see you sooner.  Can try DASH and Mediterranean diet options, avoiding processed foods, lowering sodium intake, avoiding pork products, and eating a plant based diet for optimal health.  Your provider would like to you have your annual eye exam. Please contact your current eye doctor or here are some good options for you to contact.   [facility]   Address: [address] [place], [place] [address] Phone: [phone]  Website: [email or web address] [address], [place], [place] [address] Phone: [phone] [email or web address]  [facility]  Address: [place], [place], [place] [address] Phone: [phone]   [facility] [address] [place], [place] [address] Phone: [phone]  [facility] Address: [place], [place], [place] [address]  Phone: [phone]  Advice on [place]   1. Daily foot inspections - Look for any breaks in the skin or areas of irritation such as blisters or red areas. Report to primary doctor or foot nurse immediately if any problems occur.  - If you have vision problems and cannot see your feet well or it is hard for you to reach your feet, ask a family member to inspect your feet daily.   2. Daily foot hygiene  - Wash feet daily, but do not soak your feet in hot water. If you do have callus, you may use warm water epsom salt foot soak and use moisturizer after. - Dry well  especially between toes; Pat dry do not rub.  - If your skin is dry use a lotion to moisturize but never between the toes.  - If your skin is wet from perspiration, use an antifungal foot powder daily.   3. Shoes and Socks  - Wear a clean pair of socks daily.  - Make sure your shoes fit well and that you are measured and properly fit each time you purchase shoes. Your shoe size may change.  - Powder your shoes with a small amount of an antifungal foot powder daily, as the shoe is the only article of clothing that is not laundered.  - Wear appropriate shoes and socks for the weather. It is especially important to protect your feet from the cold; however, don't forget about sunscreen to the tops of your feet in the hot sun.  - Wear well fitting shoes rather than slippers or flip-flops when walking or standing for long period of time.  - When at home make sure you always have protective foot wear on your feet.    NUTRITION AND EXERCISE  Eat at least 3 meals and 1-2 snacks per day (don't skip breakfast).  Aim for no more than 5 hours between eating. - Tip: If you go >5 hours without eating and become very hungry, your body will supply it's own resources temporarily and you can gain extra weight when you eat.  The 5 Minute Rule of Exercise - Promise yourself to at least do 5 minutes of exercise (make sure you time it), and if at the end of 5 minutes (this is the hardest part of the work-out), if you still feel like you want stop (or not motivated to continue) then allow yourself to stop. Otherwise, more often than not you will feel encouraged that you can continue for a little while longer or even more!  My 5 to Fitness!  5: fruits and vegetables per day (work on 9 per day if you are at 5) 4: exercise 4-5 times per week for at least 30 minutes (walking counts!) 3: meals per day (don't skip breakfast!), no more than 5 hours between meals 2: habits to quit -smoking -excess alcohol use (men >2  beer/day; women >1beer/day) 1: sweet per day (2 cookies, 1 small cup of ice cream, 12 oz soda)  These are general tips for healthy living. Try to start with 1 or 2 habit TODAY and make it a part of your life for several months. You set a goal today to work on:  Once you have 1 or 2 habits down for several months, try to begin working on your next healthy habit. With every single step you take, you will be leading a healthier lifestyle!   Diet Recommendations for Diabetes   Starchy (carb) foods include: Bread, rice, pasta, potatoes, corn, crackers, bagels, muffins, all baked goods.   Protein foods include: Meat, fish, poultry, eggs, dairy foods, and beans such as pinto and kidney beans (beans also provide carbohydrate).   1. Eat at least 3 meals and 1-2 snacks per day. Never go more than 4-5 hours while awake without eating.   2. Limit starchy foods to TWO per meal and ONE per snack. ONE portion of a starchy  food is equal to the following:   - ONE slice of bread (or its equivalent, such as half of a hamburger bun).   - 1/2 cup of a "scoopable" starchy food such as potatoes or rice.   - 1 OUNCE (28 grams) of starchy snacks (crackers or pretzels, look on label).   - 15 grams of carbohydrate as shown on food label.   3. Both lunch and dinner should include a protein food, a carb food, and vegetables.   - Obtain twice as many veg's as protein or carbohydrate foods for both lunch and dinner.   - Try to keep frozen veg's on hand for a quick vegetable serving.     - Fresh or frozen veg's are best.   4. Breakfast should always include protein.    You can learn more information online about your diabetes at American Diabetes Association: http://www.diabetes.org/ - General self-care (diet, medications, blood sugar checks). - Diet recommendations - There are even recipes available for you to look at and try.  We will plan to see you back in 6 months for follow up on diabetes and  hypertension  You will receive a survey after today's visit either digitally by e-mail or paper by [employer]. Your experiences and feedback matter to [place].  Please respond so we know how we are doing as we provide care for you.  Call us with any questions/concerns/needs.  It is my goal to be available to you for your health concerns.  Thanks for choosing me to be a partner in your healthcare needs!  Charlaine Dalton, FNP-C Family Nurse Practitioner [facility] Phone: [phone]  [phone]  

## 2019-08-03 NOTE — Assessment & Plan Note (Signed)
Stable and well controlled hypertension.  BP is at goal < 130/80.  Pt is working on lifestyle modifications.  Taking medications tolerating well without side effects. No known complications.  Plan: 1. Continue current treatment plan 2. Obtain labs ordered today in next 1-2 weeks  3. Encouraged heart healthy diet and increasing exercise to 30 minutes most days of the week, going no more than 2 days in a row without exercise. 4. Check BP 1-2 x per week at home, keep log, and bring to clinic at next appointment. 5. Follow up 6 months.

## 2019-08-04 ENCOUNTER — Other Ambulatory Visit: Payer: Self-pay | Admitting: Nurse Practitioner

## 2019-08-04 DIAGNOSIS — E119 Type 2 diabetes mellitus without complications: Secondary | ICD-10-CM

## 2019-08-13 ENCOUNTER — Other Ambulatory Visit: Payer: Self-pay | Admitting: Family Medicine

## 2019-08-13 DIAGNOSIS — F4010 Social phobia, unspecified: Secondary | ICD-10-CM

## 2019-08-17 ENCOUNTER — Other Ambulatory Visit: Payer: Medicaid Other

## 2019-08-17 ENCOUNTER — Other Ambulatory Visit: Payer: Self-pay

## 2019-08-17 DIAGNOSIS — R635 Abnormal weight gain: Secondary | ICD-10-CM | POA: Diagnosis not present

## 2019-08-17 DIAGNOSIS — Z6836 Body mass index (BMI) 36.0-36.9, adult: Secondary | ICD-10-CM | POA: Diagnosis not present

## 2019-08-17 DIAGNOSIS — E782 Mixed hyperlipidemia: Secondary | ICD-10-CM | POA: Diagnosis not present

## 2019-08-17 DIAGNOSIS — I10 Essential (primary) hypertension: Secondary | ICD-10-CM | POA: Diagnosis not present

## 2019-08-17 DIAGNOSIS — I252 Old myocardial infarction: Secondary | ICD-10-CM | POA: Diagnosis not present

## 2019-08-17 DIAGNOSIS — E119 Type 2 diabetes mellitus without complications: Secondary | ICD-10-CM | POA: Diagnosis not present

## 2019-08-17 DIAGNOSIS — Z1329 Encounter for screening for other suspected endocrine disorder: Secondary | ICD-10-CM | POA: Diagnosis not present

## 2019-08-18 LAB — LIPID PANEL
Cholesterol: 170 mg/dL (ref <200)
HDL: 34 mg/dL — ABNORMAL LOW (ref >=40)
LDL Cholesterol (Calc): 94 mg/dL (calc)
Non-HDL Cholesterol (Calc): 136 mg/dL (calc) — ABNORMAL HIGH (ref <130)
Total CHOL/HDL Ratio: 5 (calc) — ABNORMAL HIGH (ref <5.0)
Triglycerides: 291 mg/dL — ABNORMAL HIGH (ref <150)

## 2019-08-18 LAB — COMPLETE METABOLIC PANEL WITH GFR
AG Ratio: 1.9 (calc) (ref 1.0–2.5)
ALT: 23 U/L (ref 9–46)
AST: 17 U/L (ref 10–40)
Albumin: 4 g/dL (ref 3.6–5.1)
Alkaline phosphatase (APISO): 51 U/L (ref 36–130)
BUN: 18 mg/dL (ref 7–25)
CO2: 25 mmol/L (ref 20–32)
Calcium: 9.4 mg/dL (ref 8.6–10.3)
Chloride: 109 mmol/L (ref 98–110)
Creat: 1.28 mg/dL (ref 0.60–1.35)
GFR, Est African American: 77 mL/min/{1.73_m2} (ref >=60)
GFR, Est Non African American: 66 mL/min/{1.73_m2} (ref >=60)
Globulin: 2.1 g/dL (calc) (ref 1.9–3.7)
Glucose, Bld: 149 mg/dL — ABNORMAL HIGH (ref 65–99)
Potassium: 3.9 mmol/L (ref 3.5–5.3)
Sodium: 140 mmol/L (ref 135–146)
Total Bilirubin: 0.3 mg/dL (ref 0.2–1.2)
Total Protein: 6.1 g/dL (ref 6.1–8.1)

## 2019-08-18 LAB — CBC WITH DIFFERENTIAL/PLATELET
Absolute Monocytes: 702 cells/uL (ref 200–950)
Basophils Absolute: 99 cells/uL (ref 0–200)
Basophils Relative: 1.1 %
Eosinophils Absolute: 351 cells/uL (ref 15–500)
Eosinophils Relative: 3.9 %
HCT: 45.7 % (ref 38.5–50.0)
Hemoglobin: 14.9 g/dL (ref 13.2–17.1)
Lymphs Abs: 3717 cells/uL (ref 850–3900)
MCH: 29.1 pg (ref 27.0–33.0)
MCHC: 32.6 g/dL (ref 32.0–36.0)
MCV: 89.3 fL (ref 80.0–100.0)
MPV: 10 fL (ref 7.5–12.5)
Monocytes Relative: 7.8 %
Neutro Abs: 4131 cells/uL (ref 1500–7800)
Neutrophils Relative %: 45.9 %
Platelets: 234 10*3/uL (ref 140–400)
RBC: 5.12 10*6/uL (ref 4.20–5.80)
RDW: 13.3 % (ref 11.0–15.0)
Total Lymphocyte: 41.3 %
WBC: 9 10*3/uL (ref 3.8–10.8)

## 2019-08-18 LAB — THYROID PANEL WITH TSH
Free Thyroxine Index: 2.2 (ref 1.4–3.8)
T3 Uptake: 31 % (ref 22–35)
T4, Total: 7.2 ug/dL (ref 4.9–10.5)
TSH: 2.63 mIU/L (ref 0.40–4.50)

## 2019-09-01 DIAGNOSIS — H5213 Myopia, bilateral: Secondary | ICD-10-CM | POA: Diagnosis not present

## 2019-09-01 DIAGNOSIS — Z23 Encounter for immunization: Secondary | ICD-10-CM | POA: Diagnosis not present

## 2019-09-29 DIAGNOSIS — Z23 Encounter for immunization: Secondary | ICD-10-CM | POA: Diagnosis not present

## 2019-10-18 ENCOUNTER — Other Ambulatory Visit: Payer: Self-pay | Admitting: Family Medicine

## 2019-10-18 DIAGNOSIS — M545 Low back pain, unspecified: Secondary | ICD-10-CM

## 2019-10-18 NOTE — Telephone Encounter (Signed)
Requested medication (s) are due for refill today: yes  Requested medication (s) are on the active medication list:yes  Last refill:  08/03/19  #30  2 refills  Future visit scheduled: yes  Notes to clinic: Not delegated    Requested Prescriptions  Pending Prescriptions Disp Refills   baclofen (LIORESAL) 10 MG tablet [Pharmacy Med Name: BACLOFEN 10MG  TABLETS] 30 tablet 2    Sig: TAKE 1/2 TO 1 TABLET(5 TO 10 MG) BY MOUTH AT BEDTIME AS NEEDED FOR MUSCLE SPASMS      Not Delegated - Analgesics:  Muscle Relaxants Failed - 10/18/2019  2:12 PM      Failed - This refill cannot be delegated      Passed - Valid encounter within last 6 months    Recent Outpatient Visits           2 months ago Diabetes mellitus without complication [facility])   [facility], [facility], FNP   8 months ago Diabetes mellitus without complication [facility])   [facility], NP   10 months ago Hypertension, unspecified type   [facility], Kyung Rudd, NP   1 year ago Diabetes mellitus without complication [facility])   [facility], Kyung Rudd, NP   1 year ago Encounter for smoking cessation counseling   [facility], Kyung Rudd, NP       Future Appointments             In 3 months [place], Alison Stalling, FNP [facility], [facility]

## 2019-10-27 ENCOUNTER — Other Ambulatory Visit: Payer: Self-pay | Admitting: Family Medicine

## 2019-10-27 DIAGNOSIS — E7849 Other hyperlipidemia: Secondary | ICD-10-CM

## 2019-12-13 ENCOUNTER — Other Ambulatory Visit: Payer: Self-pay | Admitting: Family Medicine

## 2019-12-13 ENCOUNTER — Other Ambulatory Visit: Payer: Self-pay | Admitting: Nurse Practitioner

## 2019-12-13 DIAGNOSIS — K219 Gastro-esophageal reflux disease without esophagitis: Secondary | ICD-10-CM

## 2019-12-13 DIAGNOSIS — I10 Essential (primary) hypertension: Secondary | ICD-10-CM

## 2020-01-08 ENCOUNTER — Other Ambulatory Visit: Payer: Self-pay | Admitting: Family Medicine

## 2020-01-08 DIAGNOSIS — M545 Low back pain, unspecified: Secondary | ICD-10-CM

## 2020-01-08 NOTE — Telephone Encounter (Signed)
Requested medication (s) are due for refill today: yes  Requested medication (s) are on the active medication list: yes  Last refill:  10/19/19 #30 2 refills   Future visit scheduled: yes   Notes to clinic:  not delegated per protocol     Requested Prescriptions  Pending Prescriptions Disp Refills   baclofen (LIORESAL) 10 MG tablet [Pharmacy Med Name: BACLOFEN 10MG  TABLETS] 30 tablet 2    Sig: TAKE 1/2 TO 1 TABLET(5 TO 10 MG) BY MOUTH AT BEDTIME AS NEEDED FOR MUSCLE SPASMS      Not Delegated - Analgesics:  Muscle Relaxants Failed - 01/08/2020  3:18 PM      Failed - This refill cannot be delegated      Passed - Valid encounter within last 6 months    Recent Outpatient Visits           5 months ago Diabetes mellitus without complication [facility])   [facility], FNP   11 months ago Diabetes mellitus without complication [facility])   [facility], Kyung Rudd, NP   1 year ago Hypertension, unspecified type   [facility], NP   1 year ago Diabetes mellitus without complication [facility])   [facility], Kyung Rudd, NP   1 year ago Encounter for smoking cessation counseling   [facility], Kyung Rudd, NP       Future Appointments             In 3 weeks Malfi, Alison Stalling, FNP [facility], [facility]

## 2020-02-02 ENCOUNTER — Ambulatory Visit: Payer: Medicaid Other | Admitting: Family Medicine

## 2020-02-05 ENCOUNTER — Encounter: Payer: Self-pay | Admitting: Family Medicine

## 2020-02-05 ENCOUNTER — Ambulatory Visit (INDEPENDENT_AMBULATORY_CARE_PROVIDER_SITE_OTHER): Payer: Medicaid Other | Admitting: Family Medicine

## 2020-02-05 ENCOUNTER — Other Ambulatory Visit: Payer: Self-pay

## 2020-02-05 VITALS — BP 127/70 | HR 59 | Temp 98.0°F | Resp 18 | Ht 65.0 in | Wt 209.2 lb

## 2020-02-05 DIAGNOSIS — J329 Chronic sinusitis, unspecified: Secondary | ICD-10-CM

## 2020-02-05 DIAGNOSIS — E119 Type 2 diabetes mellitus without complications: Secondary | ICD-10-CM

## 2020-02-05 DIAGNOSIS — Z23 Encounter for immunization: Secondary | ICD-10-CM | POA: Diagnosis not present

## 2020-02-05 DIAGNOSIS — I10 Essential (primary) hypertension: Secondary | ICD-10-CM

## 2020-02-05 DIAGNOSIS — I252 Old myocardial infarction: Secondary | ICD-10-CM

## 2020-02-05 DIAGNOSIS — B9789 Other viral agents as the cause of diseases classified elsewhere: Secondary | ICD-10-CM | POA: Insufficient documentation

## 2020-02-05 LAB — POCT GLYCOSYLATED HEMOGLOBIN (HGB A1C): Hemoglobin A1C: 5.3 % (ref 4.0–5.6)

## 2020-02-05 MED ORDER — GUAIFENESIN ER 600 MG PO TB12: 1200.0000 mg | ORAL_TABLET | Freq: Two times a day (BID) | ORAL | 0 refills | Status: AC

## 2020-02-05 MED ORDER — CARVEDILOL 25 MG PO TABS: 25.0000 mg | ORAL_TABLET | Freq: Two times a day (BID) | ORAL | 1 refills | Status: DC

## 2020-02-05 MED ORDER — BENAZEPRIL HCL 40 MG PO TABS: 40.0000 mg | ORAL_TABLET | Freq: Every day | ORAL | 1 refills | Status: DC

## 2020-02-05 NOTE — Assessment & Plan Note (Signed)
Pt < age 48.  Needs annual influenza vaccine.  VIS provided.  Plan: 1. Administer Quad flu vaccine.

## 2020-02-05 NOTE — Patient Instructions (Signed)
As we discussed, appears you have a viral sinus infection.  These can sometimes take up to a week or so to resolve.  Continue using your flonase.  I have sent in a prescription for Mucinex 1200mg  to take 2x per day.    If you have any worsening of symptoms, please let know  Continue all of your medications as prescribed  We have given you your influenza vaccine today  You can learn more information online about your diabetes at American Diabetes Association: http://www.diabetes.org/ - General self-care (diet, medications, blood sugar checks). - Diet recommendations - There are even recipes available for you to look at and try.  Try to get exercise a minimum of 30 minutes per day at least 5 days per week as well as  adequate water intake all while measuring blood pressure a few times per week.  Keep a blood pressure log and bring back to clinic at your next visit.  If your readings are consistently over 130/80 to contact our office/send me a MyChart message and we will see you sooner.  Can try DASH and Mediterranean diet options, avoiding processed foods, lowering sodium intake, avoiding pork products, and eating a plant based diet for optimal health.  We will plan to see you back in 6 months for hypertension and diabetes follow up visit  You will receive a survey after today's visit either digitally by e-mail or paper by USPS mail. Your experiences and feedback matter to [place].  Please respond so we know how we are doing as we provide care for you.  Call us with any questions/concerns/needs.  It is my goal to be available to you for your health concerns.  Thanks for choosing me to be a partner in your healthcare needs!  [place], FNP-C Family Nurse Practitioner [facility] Phone: [phone]

## 2020-02-05 NOTE — Assessment & Plan Note (Addendum)
Likely viral process given has been present for 3 days.  Some sinus tenderness but no purulent drainage or fevers.  Encouraged to continue use of flonase and will send in prescription for mucinex 1200mg  to use BID.  Can use coriciden HBP as this cold medicine is considered safe for patients with a history of hypertension.  Plan: 1. Continue flonase as directed 2. Begin mucinex 1200mg  BID x 10 days 3. RTC if symptoms worsen or fail to improve

## 2020-02-05 NOTE — Progress Notes (Signed)
Subjective:    Patient ID: Calvin Ortiz, male    DOB: 1971/02/16, 49 y.o.   MRN: [id number]  Calvin Ortiz is a 49 y.o. male presenting on 02/05/2020 for Sinusitis (sinus pressure under the eyes, in the cheeks x 3 days ) and Diabetes   HPI   Mr. Calvin Ortiz presents to clinic for a follow up on his hypertension, diabetes and for concerns of sinusitis x 3 days.  Reports he has been taking ibuprofen, using flonase and warm compresses for his sinus pressure without change in symptoms.  Denies fevers, sore throat, change in taste/smell, cough, SOB, CP, DOE, body aches, abdominal pain, n/v/d.  Diabetes Pt presents today for follow up Type 2 Diabetes Mellitus.  He/she (caps): He ACTION; IS/IS NOT: is not checking AM CBG at home. -Current diabetic medications include: Dietary and lifestyle modifications -ACTION; IS/IS NOT: is not currently symptomatic -Actions; denies/reports/admits to: denies polydipsia, polyphagia, polyuria, headaches, diaphoresis, shakiness, chills, pain, numbness or tingling in extremities or changes in vision -Clinical course has been improving  -Reports no structured exercise routine -Diet is high in salt, high in fat, and high in carbohydrates  PREVENTION Eye exam current (within 1 year) Due, encouraged to schedule Foot exam current (within 1 year) Up to date Lipid/ASCVD risk reduction - on statin: YES/NO: Yes  Kidney Protection (On ACE/ARB)? YES/NO: Yes   Hypertension - He is not checking BP at home or outside of clinic.    - Current medications: amlodipine 10mg  daily, benazepril 40mg  daily, carvedilol 25mg  BID, tolerating well without side effects - He is not currently symptomatic. - Pt denies headache, lightheadedness, dizziness, changes in vision, chest tightness/pressure, palpitations, leg swelling, sudden loss of speech or loss of consciousness. - He  reports no regular exercise routine. - His diet is high in alt, high in fat, and high in  carbohydrates.   Depression screen [facility] 2/9 01/31/2019 10/11/2017 10/11/2017  Decreased Interest 1 1 1   Down, Depressed, Hopeless 0 0 0  PHQ - 2 Score 1 1 1   Altered sleeping 2 1 1   Tired, decreased energy 0 1 1  Change in appetite 0 1 1  Feeling bad or failure about yourself  0 0 0  Trouble concentrating 0 0 0  Moving slowly or fidgety/restless 0 0 0  Suicidal thoughts 0 0 0  PHQ-9 Score 3 4 4   Difficult doing work/chores Somewhat difficult Not difficult at all Not difficult at all    Social History   Tobacco Use  . Smoking status: Former Smoker    Packs/day: 0.00    Years: 25.00    Pack years: 0.00    Types: Cigarettes    Quit date: 04/13/2018    Years since quitting: 1.8  . Smokeless tobacco: Never Used  . Tobacco comment: Has quit off and on about 5-6 times (09/2017)  Vaping Use  . Vaping Use: Never used  Substance Use Topics  . Alcohol use: No  . Drug use: No    Review of Systems  Constitutional: Negative.   HENT: Positive for congestion, sinus pressure and sinus pain. Negative for dental problem, drooling, ear discharge, ear pain, facial swelling, hearing loss, mouth sores, nosebleeds, postnasal drip, rhinorrhea, sneezing, sore throat, tinnitus, trouble swallowing and voice change.   Eyes: Negative.   Respiratory: Negative.   Cardiovascular: Negative.   Gastrointestinal: Negative.   Endocrine: Negative.   Genitourinary: Negative.   Musculoskeletal: Negative.   Skin: Negative.   Allergic/Immunologic: Negative.   Neurological: Negative.  Hematological: Negative.   Psychiatric/Behavioral: Negative.    Per HPI unless specifically indicated above     Objective:    BP 127/70 (BP Location: Right Arm, Patient Position: Sitting, Cuff Size: Normal)   Pulse (!) 59   Temp 98 F (36.7 C) (Oral)   Resp 18   Ht 5\' 5"  (1.651 m)   Wt 209 lb 3.2 oz (94.9 kg)   SpO2 99%   BMI 34.81 kg/m   Wt Readings from Last 3 Encounters:  02/05/20 209 lb 3.2 oz (94.9 kg)    08/03/19 216 lb 6.4 oz (98.2 kg)  01/31/19 217 lb 12.8 oz (98.8 kg)    Physical Exam Vitals and nursing note reviewed.  Constitutional:      General: He is not in acute distress.    Appearance: Normal appearance. He is well-developed and well-groomed. He is obese. He is not ill-appearing or toxic-appearing.  HENT:     Head: Normocephalic and atraumatic.     Nose: Congestion and rhinorrhea present.     Right Turbinates: Swollen and pale.     Left Turbinates: Swollen and pale.     Right Sinus: Maxillary sinus tenderness present. No frontal sinus tenderness.     Left Sinus: Maxillary sinus tenderness present. No frontal sinus tenderness.     Comments: 02/02/19 is in place, covering mouth and nose.    Mouth/Throat:     Lips: Pink. No lesions.     Mouth: Mucous membranes are moist.     Pharynx: No oropharyngeal exudate or posterior oropharyngeal erythema.  Eyes:     General:        Right eye: No discharge.        Left eye: No discharge.     Extraocular Movements: Extraocular movements intact.     Conjunctiva/sclera: Conjunctivae normal.     Pupils: Pupils are equal, round, and reactive to light.  Cardiovascular:     Rate and Rhythm: Normal rate and regular rhythm.     Pulses: Normal pulses.     Heart sounds: Normal heart sounds. No murmur heard.  No friction rub. No gallop.   Pulmonary:     Effort: Pulmonary effort is normal. No respiratory distress.     Breath sounds: Normal breath sounds.  Musculoskeletal:     Cervical back: Neck supple.     Right lower leg: No edema.     Left lower leg: No edema.  Lymphadenopathy:     Cervical: No cervical adenopathy.  Skin:    General: Skin is warm and dry.     Capillary Refill: Capillary refill takes less than 2 seconds.  Neurological:     General: No focal deficit present.     Mental Status: He is alert and oriented to person, place, and time.  Psychiatric:        Attention and Perception: Attention and perception normal.         Mood and Affect: Mood and affect normal.        Speech: Speech normal.        Behavior: Behavior normal. Behavior is cooperative.        Thought Content: Thought content normal.        Cognition and Memory: Cognition and memory normal.    Results for orders placed or performed in visit on 02/05/20  POCT glycosylated hemoglobin (Hb A1C)  Result Value Ref Range   Hemoglobin A1C 5.3 4.0 - 5.6 %   HbA1c POC (<> result, manual entry)  HbA1c, POC (prediabetic range)     HbA1c, POC (controlled diabetic range)        Assessment & Plan:   Problem List Items Addressed This Visit      Cardiovascular and Mediastinum   Hypertension    Controlled hypertension.  BP is at goal < 130/80.  Pt is working on lifestyle modifications.  Taking medications tolerating well without side effects.  Complications:  Obesity, Hx stroke, Hx MI, OSA, diet controlled T2DM, GERD, HLD  Plan: 1. Continue taking amlodipine 10mg  daily, benazepril 40mg  daily, carvedilol 25mg  BID 2. Obtain labs at next visit  3. Encouraged heart healthy diet and increasing exercise to 30 minutes most days of the week, going no more than 2 days in a row without exercise. 4. Check BP 1-2 x per week at home, keep log, and bring to clinic at next appointment. 5. Follow up 6 months.       Relevant Medications   benazepril (LOTENSIN) 40 MG tablet   carvedilol (COREG) 25 MG tablet     Respiratory   Viral sinusitis    Likely viral process given has been present for 3 days.  Some sinus tenderness but no purulent drainage or fevers.  Encouraged to continue use of flonase and will send in prescription for mucinex 1200mg  to use BID.  Can use coriciden HBP as this cold medicine is considered safe for patients with a history of hypertension.  Plan: 1. Continue flonase as directed 2. Begin mucinex 1200mg  BID x 10 days 3. RTC if symptoms worsen or fail to improve      Relevant Medications   guaiFENesin (MUCINEX) 600 MG 12 hr tablet      Endocrine   Diabetes mellitus without complication (HCC) - Primary    Well-controlledDM with A1c 5.3% improved from 5.6% on 08/03/2019 and goal A1c < 7.0%.  Currently diet and lifestyle modifications in place for control over his diabetes and doing well.  Plan:  1. Continue current therapy: lifestyle and dietary modifications 2. Encourage improved lifestyle: - low carb/low glycemic diet reinforced prior education - Increase physical activity to 30 minutes most days of the week.  Explained that increased physical activity increases body's use of sugar for energy. 3. Check fasting am CBG and log these.  Bring log to next visit for review 4. Continue ASA, ACEi and Statin 5. Encouraged to schedule DM eye exam and forward results 6. Follow-up 6 months       Relevant Medications   benazepril (LOTENSIN) 40 MG tablet   Other Relevant Orders   POCT glycosylated hemoglobin (Hb A1C) (Completed)     Other   History of MI (myocardial infarction)   Relevant Medications   carvedilol (COREG) 25 MG tablet   Needs flu shot    Pt < age 38.  Needs annual influenza vaccine.  VIS provided.  Plan: 1. Administer Quad flu vaccine.       Relevant Orders   Flu Vaccine QUAD 6+ mos PF IM (Fluarix Quad PF) (Completed)      Meds ordered this encounter  Medications  . guaiFENesin (MUCINEX) 600 MG 12 hr tablet    Sig: Take 2 tablets (1,200 mg total) by mouth 2 (two) times daily for 10 days.    Dispense:  40 tablet    Refill:  0  . benazepril (LOTENSIN) 40 MG tablet    Sig: Take 1 tablet (40 mg total) by mouth daily.    Dispense:  90 tablet    Refill:  1  . carvedilol (COREG) 25 MG tablet    Sig: Take 1 tablet (25 mg total) by mouth 2 (two) times daily with a meal.    Dispense:  180 tablet    Refill:  1    Follow up plan: Return in about 6 months (around 08/05/2020) for T2DM, A1C, HTN F/U.   Charlaine DaltonNicole Marie Daemyn Gariepy, FNP Family Nurse Practitioner [facility] 02/05/2020, 2:55 PM

## 2020-02-05 NOTE — Assessment & Plan Note (Signed)
Controlled hypertension.  BP is at goal < 130/80.  Pt is working on lifestyle modifications.  Taking medications tolerating well without side effects.  Complications:  Obesity, Hx stroke, Hx MI, OSA, diet controlled T2DM, GERD, HLD  Plan: 1. Continue taking amlodipine 10mg  daily, benazepril 40mg  daily, carvedilol 25mg  BID 2. Obtain labs at next visit  3. Encouraged heart healthy diet and increasing exercise to 30 minutes most days of the week, going no more than 2 days in a row without exercise. 4. Check BP 1-2 x per week at home, keep log, and bring to clinic at next appointment. 5. Follow up 6 months.

## 2020-02-05 NOTE — Assessment & Plan Note (Signed)
Well-controlledDM with A1c 5.3% improved from 5.6% on 08/03/2019 and goal A1c < 7.0%.  Currently diet and lifestyle modifications in place for control over his diabetes and doing well.  Plan:  1. Continue current therapy: lifestyle and dietary modifications 2. Encourage improved lifestyle: - low carb/low glycemic diet reinforced prior education - Increase physical activity to 30 minutes most days of the week.  Explained that increased physical activity increases body's use of sugar for energy. 3. Check fasting am CBG and log these.  Bring log to next visit for review 4. Continue ASA, ACEi and Statin 5. Encouraged to schedule DM eye exam and forward results 6. Follow-up 6 months

## 2020-02-09 ENCOUNTER — Other Ambulatory Visit: Payer: Self-pay | Admitting: Family Medicine

## 2020-02-09 DIAGNOSIS — F4010 Social phobia, unspecified: Secondary | ICD-10-CM

## 2020-02-09 NOTE — Telephone Encounter (Signed)
Requested Prescriptions  Pending Prescriptions Disp Refills   FLUoxetine (PROZAC) 20 MG capsule [Pharmacy Med Name: FLUOXETINE 20MG  CAPSULES] 90 capsule 1    Sig: TAKE 1 CAPSULE(20 MG) BY MOUTH DAILY     Psychiatry:  Antidepressants - SSRI Passed - 02/09/2020  7:32 AM      Passed - Valid encounter within last 6 months    Recent Outpatient Visits          4 days ago Diabetes mellitus without complication [facility])   [facility], FNP   6 months ago Diabetes mellitus without complication [facility])   [facility], [facility], FNP   1 year ago Diabetes mellitus without complication [facility])   [facility], NP   1 year ago Hypertension, unspecified type   [facility], NP   1 year ago Diabetes mellitus without complication [facility])   [facility], Kyung Rudd, NP      Future Appointments            In 5 months Malfi, Alison Stalling, FNP [facility], [facility]

## 2020-03-03 ENCOUNTER — Other Ambulatory Visit: Payer: Self-pay | Admitting: Family Medicine

## 2020-03-03 DIAGNOSIS — E7849 Other hyperlipidemia: Secondary | ICD-10-CM

## 2020-03-03 NOTE — Telephone Encounter (Signed)
Requested Prescriptions  Pending Prescriptions Disp Refills   fenofibrate (TRICOR) 145 MG tablet [Pharmacy Med Name: FENOFIBRATE $RemoveBeforeDEI'145MG'MMYvtroecNoaHujc$  TABLETS] 90 tablet 1    Sig: TAKE 1 TABLET(145 MG) BY MOUTH DAILY     Cardiovascular:  Antilipid - Fibric Acid Derivatives Failed - 03/03/2020  4:25 PM      Failed - HDL in normal range and within 360 days    HDL  Date Value Ref Range Status  08/17/2019 34 (L) > OR = 40 mg/dL Final  12/10/2016 22 (L) >39 mg/dL Final         Failed - Triglycerides in normal range and within 360 days    Triglycerides  Date Value Ref Range Status  08/17/2019 291 (H) <150 mg/dL Final    Comment:    . If a non-fasting specimen was collected, consider repeat triglyceride testing on a fasting specimen if clinically indicated.  Yates Decamp et al. J. of Clin. Lipidol. 2423;5:361-443. .          Failed - ALT in normal range and within 180 days    ALT  Date Value Ref Range Status  08/17/2019 23 9 - 46 U/L Final   SGPT (ALT)  Date Value Ref Range Status  02/03/2012 35 12 - 78 U/L Final         Failed - AST in normal range and within 180 days    AST  Date Value Ref Range Status  08/17/2019 17 10 - 40 U/L Final   SGOT(AST)  Date Value Ref Range Status  02/03/2012 35 15 - 37 Unit/L Final         Failed - Cr in normal range and within 180 days    Creat  Date Value Ref Range Status  08/17/2019 1.28 0.60 - 1.35 mg/dL Final         Failed - eGFR in normal range and within 180 days    GFR, Est African American  Date Value Ref Range Status  08/17/2019 77 > OR = 60 mL/min/1.59m2 Final   GFR, Est Non African American  Date Value Ref Range Status  08/17/2019 66 > OR = 60 mL/min/1.49m2 Final         Passed - Total Cholesterol in normal range and within 360 days    Cholesterol, Total  Date Value Ref Range Status  12/10/2016 139 100 - 199 mg/dL Final   Cholesterol  Date Value Ref Range Status  08/17/2019 170 <200 mg/dL Final         Passed - LDL in normal  range and within 360 days    LDL Cholesterol (Calc)  Date Value Ref Range Status  08/17/2019 94 mg/dL (calc) Final    Comment:    Reference range: <100 . Desirable range <100 mg/dL for primary prevention;   <70 mg/dL for patients with CHD or diabetic patients  with > or = 2 CHD risk factors. Marland Kitchen LDL-C is now calculated using the Martin-Hopkins  calculation, which is a validated novel method providing  better accuracy than the Friedewald equation in the  estimation of LDL-C.  Cresenciano Genre et al. Annamaria Helling. 1540;086(76): 2061-2068  (http://education.[employer].com/faq/FAQ164)          Passed - Valid encounter within last 12 months    Recent Outpatient Visits          3 weeks ago Diabetes mellitus without complication [facility])   [place], FNP   7 months ago Diabetes mellitus without complication [facility])   [place], FNP   1 year ago Diabetes mellitus without complication [facility])   [facility] Merrilyn Puma, Jerrel Ivory, NP   1 year ago Hypertension, unspecified type   [facility] Merrilyn Puma, Jerrel Ivory, NP   1 year ago Diabetes mellitus without complication [facility])   [facility] Merrilyn Puma, Jerrel Ivory, NP      Future Appointments            In 5 months Malfi, Lupita Raider, [facility], [facility]

## 2020-03-04 ENCOUNTER — Other Ambulatory Visit: Payer: Self-pay | Admitting: Family Medicine

## 2020-03-04 DIAGNOSIS — M545 Low back pain, unspecified: Secondary | ICD-10-CM

## 2020-03-04 NOTE — Telephone Encounter (Signed)
Requested medication (s) are due for refill today: yes  Requested medication (s) are on the active medication list: yes  Last refill:  01/08/20 #30 with 2 refills  Future visit scheduled: yes  Notes to clinic:  Please review for refill. Refill not delegated per protocol    Requested Prescriptions  Pending Prescriptions Disp Refills   baclofen (LIORESAL) 10 MG tablet [Pharmacy Med Name: BACLOFEN 10MG  TABLETS] 30 tablet 2    Sig: TAKE 1/2 TO 1 TABLET(5 TO 10 MG) BY MOUTH AT BEDTIME AS NEEDED FOR MUSCLE SPASMS      Not Delegated - Analgesics:  Muscle Relaxants Failed - 03/04/2020  9:49 AM      Failed - This refill cannot be delegated      Passed - Valid encounter within last 6 months    Recent Outpatient Visits           4 weeks ago Diabetes mellitus without complication [facility])   [facility], [facility], FNP   7 months ago Diabetes mellitus without complication [facility])   [facility], [facility], FNP   1 year ago Diabetes mellitus without complication [facility])   [facility], Kyung Rudd, NP   1 year ago Hypertension, unspecified type   [facility], NP   1 year ago Diabetes mellitus without complication [facility])   [facility], Kyung Rudd, NP       Future Appointments             In 5 months Malfi, Alison Stalling, FNP [facility], [facility]

## 2020-03-27 ENCOUNTER — Other Ambulatory Visit: Payer: Self-pay | Admitting: Family Medicine

## 2020-03-27 DIAGNOSIS — J301 Allergic rhinitis due to pollen: Secondary | ICD-10-CM

## 2020-03-30 ENCOUNTER — Encounter: Payer: Self-pay | Admitting: Family Medicine

## 2020-03-30 DIAGNOSIS — J301 Allergic rhinitis due to pollen: Secondary | ICD-10-CM

## 2020-03-30 DIAGNOSIS — K219 Gastro-esophageal reflux disease without esophagitis: Secondary | ICD-10-CM

## 2020-04-01 MED ORDER — FLUTICASONE PROPIONATE 50 MCG/ACT NA SUSP: NASAL | 3 refills | Status: DC

## 2020-04-01 MED ORDER — PANTOPRAZOLE SODIUM 20 MG PO TBEC: 20.0000 mg | DELAYED_RELEASE_TABLET | Freq: Every day | ORAL | 1 refills | Status: DC

## 2020-05-02 ENCOUNTER — Encounter: Payer: Self-pay | Admitting: Family Medicine

## 2020-05-02 ENCOUNTER — Telehealth (INDEPENDENT_AMBULATORY_CARE_PROVIDER_SITE_OTHER): Payer: Medicaid Other | Admitting: Family Medicine

## 2020-05-02 ENCOUNTER — Other Ambulatory Visit: Payer: Self-pay

## 2020-05-02 VITALS — BP 136/85

## 2020-05-02 DIAGNOSIS — M5442 Lumbago with sciatica, left side: Secondary | ICD-10-CM | POA: Diagnosis not present

## 2020-05-02 MED ORDER — CYCLOBENZAPRINE HCL 10 MG PO TABS: 5.0000 mg | ORAL_TABLET | Freq: Three times a day (TID) | ORAL | 0 refills | Status: DC | PRN

## 2020-05-02 MED ORDER — PREDNISONE 20 MG PO TABS: 40.0000 mg | ORAL_TABLET | Freq: Every day | ORAL | 0 refills | Status: AC

## 2020-05-02 NOTE — Progress Notes (Signed)
Virtual Visit via Telephone  The purpose of this virtual visit is to provide medical care while limiting exposure to the novel coronavirus (COVID19) for both patient and office staff.  Consent was obtained for phone visit:  Yes.   Answered questions that patient had about telehealth interaction:  Yes.   I discussed the limitations, risks, security and privacy concerns of performing an evaluation and management service by telephone. I also discussed with the patient that there may be a patient responsible charge related to this service. The patient expressed understanding and agreed to proceed.  Patient is at home and is accessed via telephone Services are provided by Charlaine Dalton, FNP-C from [facility])  ---------------------------------------------------------------------- Chief Complaint  Patient presents with  . Back Pain    Sciatic pain on the left lower hip down the buttock and leg. Pain worsen with standing. X 4 days     S: Reviewed CMA documentation. I have called patient and gathered additional HPI as follows:  Mr. Colledge presents for virtual telemedicine visit via telephone for concerns of left sided back pain that goes up the left side of his back and down the back of his left thigh since Sunday night.  Reports difficulty with standing up straight and walking.  Has been using a previous walker he had at home for ambulation.  Reports discomfort started in the middle of the night.  No new injury, trauma, fall, twisting/popping prior to discomfort.  Has had a similar issue a few years ago and his back pain lasted approximately 2 weeks.  Denies saddle anesthesia, numbness, tingling, weakness down either leg, or change in bowel/bladder function.  Patient is currently home Denies any high risk travel to areas of current concern for COVID19. Denies any known or suspected exposure to person with or possibly with COVID19.  Past Medical History:  Diagnosis  Date  . Anxiety   . Depression   . Diabetes mellitus without complication (HCC)   . GERD (gastroesophageal reflux disease)   . Hyperlipidemia   . Hypertension   . Migraines   . Myocardial infarction (HCC)    3 Stents  . Sleep apnea   . Stroke [facility])    Social History   Tobacco Use  . Smoking status: Former Smoker    Packs/day: 0.00    Years: 25.00    Pack years: 0.00    Types: Cigarettes    Quit date: 04/13/2018    Years since quitting: 2.0  . Smokeless tobacco: Never Used  . Tobacco comment: Has quit off and on about 5-6 times (09/2017)  Vaping Use  . Vaping Use: Never used  Substance Use Topics  . Alcohol use: No  . Drug use: No    Current Outpatient Medications:  .  acetaminophen (TYLENOL) 650 MG CR tablet, Take 650 mg by mouth every 6 (six) hours as needed for pain., Disp: , Rfl:  .  amLODipine (NORVASC) 10 MG tablet, TAKE 1 TABLET(10 MG) BY MOUTH DAILY, Disp: 90 tablet, Rfl: 1 .  aspirin EC 81 MG tablet, Take 81 mg by mouth daily., Disp: , Rfl:  .  atorvastatin (LIPITOR) 40 MG tablet, TAKE 1 TABLET(40 MG) BY MOUTH DAILY, Disp: 90 tablet, Rfl: 2 .  benazepril (LOTENSIN) 40 MG tablet, Take 1 tablet (40 mg total) by mouth daily., Disp: 90 tablet, Rfl: 1 .  carvedilol (COREG) 25 MG tablet, Take 1 tablet (25 mg total) by mouth 2 (two) times daily with a meal., Disp: 180  tablet, Rfl: 1 .  cyclobenzaprine (FLEXERIL) 10 MG tablet, Take 0.5-1 tablets (5-10 mg total) by mouth 3 (three) times daily as needed for muscle spasms., Disp: 30 tablet, Rfl: 0 .  fenofibrate (TRICOR) 145 MG tablet, TAKE 1 TABLET(145 MG) BY MOUTH DAILY, Disp: 90 tablet, Rfl: 1 .  fexofenadine (ALLEGRA) 180 MG tablet, Take 1 tablet (180 mg total) by mouth daily., Disp: 90 tablet, Rfl: 3 .  FLUoxetine (PROZAC) 20 MG capsule, TAKE 1 CAPSULE(20 MG) BY MOUTH DAILY, Disp: 90 capsule, Rfl: 1 .  fluticasone (FLONASE) 50 MCG/ACT nasal spray, 2 sprays in each nostril daily, Disp: 16 g, Rfl: 3 .  hydrOXYzine  (ATARAX/VISTARIL) 25 MG tablet, TAKE 2 TABLETS BY MOUTH EVERY NIGHT AT BEDTIME. MAY ALSO TAKE 1/2- 1 TABLET 2 TIMES DAILY AS NEEDED FOR ANXIETY, Disp: 120 tablet, Rfl: 5 .  isosorbide mononitrate (IMDUR) 120 MG 24 hr tablet, Take 1 tablet (120 mg total) by mouth daily., Disp: 90 tablet, Rfl: 1 .  nitroGLYCERIN (NITROSTAT) 0.4 MG SL tablet, Place 1 tablet (0.4 mg total) under the tongue every 5 (five) minutes as needed for chest pain (Take up to 3 doses)., Disp: 50 tablet, Rfl: 3 .  pantoprazole (PROTONIX) 20 MG tablet, Take 1 tablet (20 mg total) by mouth daily., Disp: 90 tablet, Rfl: 1 .  predniSONE (DELTASONE) 20 MG tablet, Take 2 tablets (40 mg total) by mouth daily with breakfast for 5 days., Disp: 10 tablet, Rfl: 0  Depression screen [facility] 2/9 01/31/2019 10/11/2017 10/11/2017  Decreased Interest 1 1 1   Down, Depressed, Hopeless 0 0 0  PHQ - 2 Score 1 1 1   Altered sleeping 2 1 1   Tired, decreased energy 0 1 1  Change in appetite 0 1 1  Feeling bad or failure about yourself  0 0 0  Trouble concentrating 0 0 0  Moving slowly or fidgety/restless 0 0 0  Suicidal thoughts 0 0 0  PHQ-9 Score 3 4 4   Difficult doing work/chores Somewhat difficult Not difficult at all Not difficult at all    GAD 7 : Generalized Anxiety Score 01/31/2019 10/11/2017 10/11/2017 07/02/2017  Nervous, Anxious, on Edge 0 1 1 3   Control/stop worrying 0 0 0 2  Worry too much - different things 0 0 0 2  Trouble relaxing 1 0 0 3  Restless 0 0 0 1  Easily annoyed or irritable 0 1 1 2   Afraid - awful might happen 0 0 0 0  Total GAD 7 Score 1 2 2 13   Anxiety Difficulty Somewhat difficult Not difficult at all Not difficult at all Somewhat difficult    -------------------------------------------------------------------------- O: No physical exam performed due to remote telephone encounter.  Physical Exam: Patient remotely monitored without video.  Verbal communication appropriate.  Cognition normal.  Recent Results (from  the past 2160 hour(s))  POCT glycosylated hemoglobin (Hb A1C)     Status: Normal   Collection Time: 02/05/20  2:07 PM  Result Value Ref Range   Hemoglobin A1C 5.3 4.0 - 5.6 %   HbA1c POC (<> result, manual entry)     HbA1c, POC (prediabetic range)     HbA1c, POC (controlled diabetic range)      -------------------------------------------------------------------------- A&P:  Problem List Items Addressed This Visit      Nervous and Auditory   Acute left-sided low back pain with left-sided sciatica - Primary    Acute flare of left sided sciatica with radicular symptoms.  Will treat with prednisone burst 40mg  daily x  5 days and cyclobenzaprine 5-10mg  TID PRN for muscle discomfort.  Encouraged to avoid NSAIDs as can cause GI upset when taken with prednisone.  Encouraged to RTC PRN      Relevant Medications   acetaminophen (TYLENOL) 650 MG CR tablet   predniSONE (DELTASONE) 20 MG tablet   cyclobenzaprine (FLEXERIL) 10 MG tablet      Meds ordered this encounter  Medications  . predniSONE (DELTASONE) 20 MG tablet    Sig: Take 2 tablets (40 mg total) by mouth daily with breakfast for 5 days.    Dispense:  10 tablet    Refill:  0  . cyclobenzaprine (FLEXERIL) 10 MG tablet    Sig: Take 0.5-1 tablets (5-10 mg total) by mouth 3 (three) times daily as needed for muscle spasms.    Dispense:  30 tablet    Refill:  0    Follow-up: - Return if symptoms worsen or fail to improve  Patient verbalizes understanding with the above medical recommendations including the limitation of remote medical advice.  Specific follow-up and call-back criteria were given for patient to follow-up or seek medical care more urgently if needed.  - Time spent in direct consultation with patient on phone: 6 minutes  Charlaine Dalton, FNP-C [facility] 05/02/2020, 1:31 PM

## 2020-05-02 NOTE — Assessment & Plan Note (Signed)
Acute flare of left sided sciatica with radicular symptoms.  Will treat with prednisone burst 40mg  daily x 5 days and cyclobenzaprine 5-10mg  TID PRN for muscle discomfort.  Encouraged to avoid NSAIDs as can cause GI upset when taken with prednisone.  Encouraged to RTC PRN

## 2020-05-07 ENCOUNTER — Emergency Department
Admission: EM | Admit: 2020-05-07 | Discharge: 2020-05-07 | Disposition: A | Discharge status: Home / Self Care | Payer: Medicaid Other | Attending: Emergency Medicine | Admitting: Emergency Medicine

## 2020-05-07 ENCOUNTER — Emergency Department: Payer: Medicaid Other

## 2020-05-07 ENCOUNTER — Other Ambulatory Visit: Payer: Self-pay

## 2020-05-07 DIAGNOSIS — I25810 Atherosclerosis of coronary artery bypass graft(s) without angina pectoris: Secondary | ICD-10-CM | POA: Insufficient documentation

## 2020-05-07 DIAGNOSIS — Z87891 Personal history of nicotine dependence: Secondary | ICD-10-CM | POA: Insufficient documentation

## 2020-05-07 DIAGNOSIS — R0789 Other chest pain: Secondary | ICD-10-CM | POA: Insufficient documentation

## 2020-05-07 DIAGNOSIS — I2510 Atherosclerotic heart disease of native coronary artery without angina pectoris: Secondary | ICD-10-CM | POA: Insufficient documentation

## 2020-05-07 DIAGNOSIS — Z7982 Long term (current) use of aspirin: Secondary | ICD-10-CM | POA: Diagnosis not present

## 2020-05-07 DIAGNOSIS — Z79899 Other long term (current) drug therapy: Secondary | ICD-10-CM | POA: Diagnosis not present

## 2020-05-07 DIAGNOSIS — R079 Chest pain, unspecified: Secondary | ICD-10-CM | POA: Diagnosis not present

## 2020-05-07 DIAGNOSIS — I119 Hypertensive heart disease without heart failure: Secondary | ICD-10-CM | POA: Diagnosis not present

## 2020-05-07 DIAGNOSIS — E119 Type 2 diabetes mellitus without complications: Secondary | ICD-10-CM | POA: Diagnosis not present

## 2020-05-07 LAB — BASIC METABOLIC PANEL
Anion gap: 8 (ref 5–15)
BUN: 15 mg/dL (ref 6–20)
CO2: 24 mmol/L (ref 22–32)
Calcium: 8.9 mg/dL (ref 8.9–10.3)
Chloride: 107 mmol/L (ref 98–111)
Creatinine, Ser: 1 mg/dL (ref 0.61–1.24)
GFR, Estimated: >60 mL/min (ref >60)
Glucose, Bld: 98 mg/dL (ref 70–99)
Potassium: 3.1 mmol/L — ABNORMAL LOW (ref 3.5–5.1)
Sodium: 139 mmol/L (ref 135–145)

## 2020-05-07 LAB — CBC
HCT: 47.1 % (ref 39.0–52.0)
Hemoglobin: 15.9 g/dL (ref 13.0–17.0)
MCH: 28.4 pg (ref 26.0–34.0)
MCHC: 33.8 g/dL (ref 30.0–36.0)
MCV: 84.1 fL (ref 80.0–100.0)
Platelets: 344 10*3/uL (ref 150–400)
RBC: 5.6 MIL/uL (ref 4.22–5.81)
RDW: 13.3 % (ref 11.5–15.5)
WBC: 14.9 10*3/uL — ABNORMAL HIGH (ref 4.0–10.5)
nRBC: 0 % (ref 0.0–0.2)

## 2020-05-07 LAB — TROPONIN I (HIGH SENSITIVITY): Troponin I (High Sensitivity): 5 ng/L (ref <18)

## 2020-05-07 MED ORDER — OXYCODONE-ACETAMINOPHEN 5-325 MG PO TABS: 1.0000 | ORAL_TABLET | Freq: Four times a day (QID) | ORAL | 0 refills | Status: DC | PRN

## 2020-05-07 MED ORDER — OXYCODONE-ACETAMINOPHEN 5-325 MG PO TABS
2.0000 | ORAL_TABLET | Freq: Once | ORAL | Status: AC
  Administered 2020-05-07: 2 via ORAL
  Filled 2020-05-07: qty 2

## 2020-05-07 MED ORDER — NAPROXEN 500 MG PO TABS: 500.0000 mg | ORAL_TABLET | Freq: Two times a day (BID) | ORAL | 0 refills | Status: AC

## 2020-05-07 MED ORDER — KETOROLAC TROMETHAMINE 60 MG/2ML IM SOLN
60.0000 mg | Freq: Once | INTRAMUSCULAR | Status: AC
  Administered 2020-05-07: 60 mg via INTRAMUSCULAR
  Filled 2020-05-07: qty 2

## 2020-05-07 MED ORDER — ONDANSETRON 4 MG PO TBDP
4.0000 mg | ORAL_TABLET | Freq: Once | ORAL | Status: AC
  Administered 2020-05-07: 4 mg via ORAL
  Filled 2020-05-07: qty 1

## 2020-05-07 MED ORDER — ONDANSETRON 4 MG PO TBDP: 4.0000 mg | ORAL_TABLET | Freq: Three times a day (TID) | ORAL | 0 refills | Status: DC | PRN

## 2020-05-07 NOTE — ED Provider Notes (Signed)
[facility] Emergency Department Provider Note  ____________________________________________   Event Date/Time   First MD Initiated Contact with Patient 05/07/20 1805     (approximate)  I have reviewed the triage vital signs and the nursing notes.   HISTORY  Chief Complaint Chest Pain    HPI Calvin Ortiz is a 50 y.o. male with history of hypertension, hyperlipidemia, diabetes, prior MI, here with left-sided chest pain.  The patient states that he sneezed approximately 4 to 5 days ago.  He experienced acute onset of initially mild left anterior chest pain at that time.  Since then, he has had ongoing chest pain that seems to have actually gotten worse.  He describes it as a sharp, stabbing, throbbing pain along his left anterior lateral chest that is worse with palpation as well as with movement.  He denies any associated shortness of breath.  No associated diaphoresis.  Symptoms do not feel anything like his cardiac pain.  He denies any significant increased cough.  He has had a sinus infection which is the reason that he was sneezing.  Denies any lower extremity swelling or history of DVT/PE.        Past Medical History:  Diagnosis Date  . Anxiety   . Depression   . Diabetes mellitus without complication (HCC)   . GERD (gastroesophageal reflux disease)   . Hyperlipidemia   . Hypertension   . Migraines   . Myocardial infarction (HCC)    3 Stents  . Sleep apnea   . Stroke [facility])     Patient Active Problem List   Diagnosis Date Noted  . Acute left-sided low back pain with left-sided sciatica 05/02/2020  . Needs flu shot 02/05/2020  . Viral sinusitis 02/05/2020  . Former smoker 04/18/2018  . Seasonal allergic rhinitis due to pollen 01/10/2018  . History of MI (myocardial infarction) 10/11/2017  . Other hyperlipidemia 04/09/2016  . Diabetes mellitus without complication (HCC) 04/09/2016  . Coronary artery disease involving native coronary  artery of native heart without angina pectoris 03/12/2015  . Mixed hyperlipidemia 02/17/2012  . Hypertension 02/07/2012    Past Surgical History:  Procedure Laterality Date  . BACK SURGERY  2005 and 2007  . CORONARY ANGIOPLASTY WITH STENT PLACEMENT    . CORONARY ARTERY BYPASS GRAFT  2013    Prior to Admission medications   Medication Sig Start Date End Date Taking? Authorizing Provider  naproxen (NAPROSYN) 500 MG tablet Take 1 tablet (500 mg total) by mouth 2 (two) times daily with a meal for 7 days. 05/07/20 05/14/20 Yes Shaune Pollack, MD  ondansetron (ZOFRAN ODT) 4 MG disintegrating tablet Take 1 tablet (4 mg total) by mouth every 8 (eight) hours as needed for nausea or vomiting. 05/07/20  Yes Shaune Pollack, MD  oxyCODONE-acetaminophen (PERCOCET) 5-325 MG tablet Take 1-2 tablets by mouth every 6 (six) hours as needed for moderate pain or severe pain. 05/07/20 05/07/21 Yes Shaune Pollack, MD  amLODipine (NORVASC) 10 MG tablet TAKE 1 TABLET(10 MG) BY MOUTH DAILY 08/03/19   Malfi, Jodelle Gross, FNP  aspirin EC 81 MG tablet Take 81 mg by mouth daily.    [provider]  atorvastatin (LIPITOR) 40 MG tablet TAKE 1 TABLET(40 MG) BY MOUTH DAILY 10/27/19   Malfi, Jodelle Gross, FNP  benazepril (LOTENSIN) 40 MG tablet Take 1 tablet (40 mg total) by mouth daily. 02/05/20   Malfi, Jodelle Gross, FNP  carvedilol (COREG) 25 MG tablet Take 1 tablet (25 mg total) by mouth  2 (two) times daily with a meal. 02/05/20   Malfi, Jodelle Gross, FNP  cyclobenzaprine (FLEXERIL) 10 MG tablet Take 0.5-1 tablets (5-10 mg total) by mouth 3 (three) times daily as needed for muscle spasms. 05/02/20   Tarri Fuller, FNP  fenofibrate (TRICOR) 145 MG tablet TAKE 1 TABLET(145 MG) BY MOUTH DAILY 03/03/20   Malfi, Jodelle Gross, FNP  fexofenadine (ALLEGRA) 180 MG tablet Take 1 tablet (180 mg total) by mouth daily. 08/03/19   Tarri Fuller, FNP  FLUoxetine (PROZAC) 20 MG capsule TAKE 1 CAPSULE(20 MG) BY MOUTH DAILY 02/09/20   Malfi, Jodelle Gross,  FNP  fluticasone [facility]) 50 MCG/ACT nasal spray 2 sprays in each nostril daily 04/01/20   Malfi, Jodelle Gross, FNP  hydrOXYzine (ATARAX/VISTARIL) 25 MG tablet TAKE 2 TABLETS BY MOUTH EVERY NIGHT AT BEDTIME. MAY ALSO TAKE 1/2- 1 TABLET 2 TIMES DAILY AS NEEDED FOR ANXIETY 08/03/19   Malfi, Jodelle Gross, FNP  isosorbide mononitrate (IMDUR) 120 MG 24 hr tablet Take 1 tablet (120 mg total) by mouth daily. 08/03/19   Malfi, Jodelle Gross, FNP  nitroGLYCERIN (NITROSTAT) 0.4 MG SL tablet Place 1 tablet (0.4 mg total) under the tongue every 5 (five) minutes as needed for chest pain (Take up to 3 doses). 08/03/19   Malfi, Jodelle Gross, FNP  pantoprazole (PROTONIX) 20 MG tablet Take 1 tablet (20 mg total) by mouth daily. 04/01/20   Malfi, Jodelle Gross, FNP  predniSONE (DELTASONE) 20 MG tablet Take 2 tablets (40 mg total) by mouth daily with breakfast for 5 days. 05/02/20 05/07/20  Tarri Fuller, FNP    Allergies Buprenorphine hcl, Morphine and related, and Sulfa antibiotics  Family History  Problem Relation Age of Onset  . COPD Mother   . Hypertension Mother   . Hyperlipidemia Mother   . Depression Mother   . Heart disease Mother   . COPD Father   . Diabetes Father   . Heart disease Father   . Stroke Father   . Cancer Father        pelvic mass  . Lung cancer Father   . Depression Father   . Diabetes Paternal Grandmother     Social History Social History   Tobacco Use  . Smoking status: Former Smoker    Packs/day: 0.00    Years: 25.00    Pack years: 0.00    Types: Cigarettes    Quit date: 04/13/2018    Years since quitting: 2.0  . Smokeless tobacco: Never Used  . Tobacco comment: Has quit off and on about 5-6 times (09/2017)  Vaping Use  . Vaping Use: Never used  Substance Use Topics  . Alcohol use: No  . Drug use: No    Review of Systems  Review of Systems  Constitutional: Positive for fatigue. Negative for chills and fever.  HENT: Negative for sore throat.   Respiratory: Positive for chest  tightness. Negative for shortness of breath.   Cardiovascular: Positive for chest pain.  Gastrointestinal: Negative for abdominal pain.  Genitourinary: Negative for flank pain.  Musculoskeletal: Negative for neck pain.  Skin: Negative for rash and wound.  Allergic/Immunologic: Negative for immunocompromised state.  Neurological: Negative for weakness and numbness.  Hematological: Does not bruise/bleed easily.  All other systems reviewed and are negative.    ____________________________________________  PHYSICAL EXAM:      VITAL SIGNS: ED Triage Vitals [05/07/20 1606]  Enc Vitals Group     BP (!) 146/91     Pulse Rate (!) 53  Resp 18     Temp 97.8 F (36.6 C)     Temp Source Oral     SpO2 99 %     Weight 203 lb (92.1 kg)     Height 5\' 4"  (1.626 m)     Head Circumference      Peak Flow      Pain Score 9     Pain Loc      Pain Edu?      Excl. in GC?      Physical Exam Vitals and nursing note reviewed.  Constitutional:      General: He is not in acute distress.    Appearance: He is well-developed.  HENT:     Head: Normocephalic and atraumatic.  Eyes:     Conjunctiva/sclera: Conjunctivae normal.  Cardiovascular:     Rate and Rhythm: Normal rate and regular rhythm.     Heart sounds: Normal heart sounds. No murmur heard. No friction rub.     Comments: Tenderness to palpation over the left anterior chest wall, particularly along the fifth and sixth intercostal spaces radiating towards the left flank.  No overlying skin changes or skin lesions. Pulmonary:     Effort: Pulmonary effort is normal. No respiratory distress.     Breath sounds: Normal breath sounds. No wheezing or rales.  Abdominal:     General: There is no distension.     Palpations: Abdomen is soft.     Tenderness: There is no abdominal tenderness.  Musculoskeletal:     Cervical back: Neck supple.  Skin:    General: Skin is warm.     Capillary Refill: Capillary refill takes less than 2 seconds.   Neurological:     Mental Status: He is alert and oriented to person, place, and time.     Motor: No abnormal muscle tone.       ____________________________________________   LABS (all labs ordered are listed, but only abnormal results are displayed)  Labs Reviewed  BASIC METABOLIC PANEL - Abnormal; Notable for the following components:      Result Value   Potassium 3.1 (*)    All other components within normal limits  CBC - Abnormal; Notable for the following components:   WBC 14.9 (*)    All other components within normal limits  TROPONIN I (HIGH SENSITIVITY)  TROPONIN I (HIGH SENSITIVITY)    ____________________________________________  EKG: Sinus bradycardia with ventricular rate of 55.  PR 230, QRS 102, QTc 405.  No acute ST elevations or depressions.  No EKG evidence of acute ischemia or infarct. ________________________________________  RADIOLOGY All imaging, including plain films, CT scans, and ultrasounds, independently reviewed by me, and interpretations confirmed via formal radiology reads.  ED MD interpretation:   Chest x-ray: Reviewed, negative  Official radiology report(s): DG Chest 2 View  Result Date: 05/07/2020 CLINICAL DATA:  Left-sided chest pain x5 days. EXAM: CHEST - 2 VIEW COMPARISON:  October 24, 2014 FINDINGS: The heart size and mediastinal contours are within normal limits. Both lungs are clear. The visualized skeletal structures are unremarkable. IMPRESSION: No active cardiopulmonary disease. Electronically Signed   By: Aram Candelahaddeus  Houston M.D.   On: 05/07/2020 16:42    ____________________________________________  PROCEDURES   Procedure(s) performed (including Critical Care):  Procedures  ____________________________________________  INITIAL IMPRESSION / MDM / ASSESSMENT AND PLAN / ED COURSE  As part of my medical decision making, I reviewed the following data within the electronic MEDICAL RECORD NUMBER Nursing notes reviewed and incorporated,  Old chart  reviewed, Notes from prior ED visits, and [place] Controlled Substance Database       *Calvin Ortiz was evaluated in Emergency Department on 05/07/2020 for the symptoms described in the history of present illness. He was evaluated in the context of the global COVID-19 pandemic, which necessitated consideration that the patient might be at risk for infection with the SARS-CoV-2 virus that causes COVID-19. Institutional protocols and algorithms that pertain to the evaluation of patients at risk for COVID-19 are in a state of rapid change based on information released by regulatory bodies including the CDC and federal and state organizations. These policies and algorithms were followed during the patient's care in the ED.  Some ED evaluations and interventions may be delayed as a result of limited staffing during the pandemic.*     Medical Decision Making: 51 year old male here with fairly atypical left anterior chest pain.  The pain is reproducible, positional, and began after sneezing, highly consistent with intercostal sprain versus costochondritis.  Pain is not with exertion and EKG is nonischemic with nonspecific changes likely related to his prior disease, and negative troponin despite constant symptoms for greater than 12 hours, making ACS highly unlikely.  No overlying skin changes or signs of zoster.  Pain is not consistent with dissection or PE.  He is not tachycardic or hypoxic.  Lab work reviewed, otherwise is largely unremarkable.  Mild leukocytosis likely reactive.  Chest x-ray reviewed and shows no evidence of pneumonia or pneumothorax.  BMP with mild hypokalemia.  Will encourage potassium intake at home.  Otherwise, will discharge with analgesia, anti-inflammatories, and good return precautions  ____________________________________________  FINAL CLINICAL IMPRESSION(S) / ED DIAGNOSES  Final diagnoses:  Chest wall pain     MEDICATIONS GIVEN DURING THIS  VISIT:  Medications  oxyCODONE-acetaminophen (PERCOCET/ROXICET) 5-325 MG per tablet 2 tablet (has no administration in time range)  ondansetron (ZOFRAN-ODT) disintegrating tablet 4 mg (has no administration in time range)  ketorolac (TORADOL) injection 60 mg (has no administration in time range)     ED Discharge Orders         Ordered    oxyCODONE-acetaminophen (PERCOCET) 5-325 MG tablet  Every 6 hours PRN        05/07/20 1927    ondansetron (ZOFRAN ODT) 4 MG disintegrating tablet  Every 8 hours PRN        05/07/20 1927    naproxen (NAPROSYN) 500 MG tablet  2 times daily with meals        05/07/20 1927           Note:  This document was prepared using Dragon voice recognition software and may include unintentional dictation errors.   Shaune Pollack, MD 05/07/20 [phone]

## 2020-05-07 NOTE — ED Triage Notes (Signed)
First Nurse Note:  C/O left rib pain x 1 week and today left arm pain and left arm 'not working right' x 1 hour.  C/O left arm numbness and not being able to grab anything with left hand.  AAOx3.  Skin warm and dry.  MAE equally and strong.  Hand grips equal and strong.  Gait steady.  NAD

## 2020-05-07 NOTE — ED Triage Notes (Signed)
Pt c/o L rib/under breast pain x 1 week. Denies falling or injury to area. Holding L arm. Able to move L arm. C/o numbness to arm. States when he moves L arm it makes L chest hurt worse. A&O, ambulatory. Cough present.

## 2020-05-08 ENCOUNTER — Telehealth: Payer: Self-pay

## 2020-05-08 NOTE — Telephone Encounter (Signed)
Transition Care Management Follow-up Telephone Call  Date of discharge and from where: 05/07/2020 [facility] ED  How have you been since you were released from the hospital? Feeling okay, still in some pain. He was able to obtain medicine and it did give him some relief.   Any questions or concerns? No  Items Reviewed:  Did the pt receive and understand the discharge instructions provided? Yes   Medications obtained and verified? Yes   Other? No   Any new allergies since your discharge? No   Dietary orders reviewed? Yes  Do you have support at home? Yes    Functional Questionnaire: (I = Independent and D = Dependent) ADLs: I  Bathing/Dressing- I  Meal Prep- I  Eating- I  Maintaining continence- I  Transferring/Ambulation- I  Managing Meds- I  Follow up appointments reviewed:   PCP Hospital f/u appt confirmed? Yes  Scheduled to see Danielle Rankin, FNP on 08/05/2020 @ 1:20pm. Patient stated he will see how he is feeling in about a week and contact his PCP if he needs a sooner appt.   Specialist Hospital f/u appt confirmed? No    Are transportation arrangements needed? No   If their condition worsens, is the pt aware to call PCP or go to the Emergency Dept.? Yes  Was the patient provided with contact information for the PCP's office or ED? Yes  Was to pt encouraged to call back with questions or concerns? Yes

## 2020-05-08 NOTE — Telephone Encounter (Signed)
Transition Care Management Unsuccessful Follow-up Telephone Call  Date of discharge and from where:  05/07/2020 [facility] ED  Attempts:  1st Attempt  Reason for unsuccessful TCM follow-up call:  Left voice message

## 2020-05-13 ENCOUNTER — Other Ambulatory Visit: Payer: Self-pay | Admitting: Family Medicine

## 2020-05-13 DIAGNOSIS — E119 Type 2 diabetes mellitus without complications: Secondary | ICD-10-CM

## 2020-06-21 ENCOUNTER — Other Ambulatory Visit: Payer: Self-pay

## 2020-07-01 ENCOUNTER — Other Ambulatory Visit: Payer: Self-pay

## 2020-07-01 DIAGNOSIS — F4010 Social phobia, unspecified: Secondary | ICD-10-CM

## 2020-07-01 MED ORDER — FLUOXETINE HCL 20 MG PO CAPS: 20.0000 mg | ORAL_CAPSULE | Freq: Every day | ORAL | 0 refills | Status: DC

## 2020-07-10 ENCOUNTER — Other Ambulatory Visit: Payer: Self-pay

## 2020-07-10 DIAGNOSIS — I10 Essential (primary) hypertension: Secondary | ICD-10-CM

## 2020-07-10 MED ORDER — BACLOFEN 10 MG PO TABS: 10.0000 mg | ORAL_TABLET | Freq: Three times a day (TID) | ORAL | 0 refills | Status: DC

## 2020-07-10 MED ORDER — ISOSORBIDE MONONITRATE ER 120 MG PO TB24: 120.0000 mg | ORAL_TABLET | Freq: Every day | ORAL | 1 refills | Status: DC

## 2020-07-31 ENCOUNTER — Other Ambulatory Visit: Payer: Self-pay | Admitting: Family Medicine

## 2020-07-31 ENCOUNTER — Other Ambulatory Visit: Payer: Self-pay

## 2020-07-31 DIAGNOSIS — J301 Allergic rhinitis due to pollen: Secondary | ICD-10-CM

## 2020-07-31 MED ORDER — FLUTICASONE PROPIONATE 50 MCG/ACT NA SUSP: NASAL | 3 refills | Status: AC

## 2020-07-31 NOTE — Telephone Encounter (Signed)
Requested medication (s) are due for refill today: no  Requested medication (s) are on the active medication list: yes  Last refill: 07/10/2020  Future visit scheduled: yes  Notes to clinic:  this refill cannot be delegated    Requested Prescriptions  Pending Prescriptions Disp Refills   baclofen (LIORESAL) 10 MG tablet [Pharmacy Med Name: BACLOFEN 10MG  TABLETS] 30 tablet 0    Sig: TAKE 1 TABLET(10 MG) BY MOUTH THREE TIMES DAILY      Not Delegated - Analgesics:  Muscle Relaxants Failed - 07/31/2020  3:33 PM      Failed - This refill cannot be delegated      Passed - Valid encounter within last 6 months    Recent Outpatient Visits           3 months ago Acute left-sided low back pain with left-sided sciatica   [facility], [facility], FNP   5 months ago Diabetes mellitus without complication [facility])   [facility], [facility], FNP   12 months ago Diabetes mellitus without complication [facility])   [facility], [facility], FNP   1 year ago Diabetes mellitus without complication [facility])   [facility], NP   1 year ago Hypertension, unspecified type   [facility], Kyung Rudd, NP       Future Appointments             In 1 week Alison Stalling, Sampson Si, NP [facility], [facility]

## 2020-08-01 ENCOUNTER — Other Ambulatory Visit: Payer: Self-pay

## 2020-08-01 DIAGNOSIS — I10 Essential (primary) hypertension: Secondary | ICD-10-CM

## 2020-08-01 MED ORDER — AMLODIPINE BESYLATE 10 MG PO TABS: ORAL_TABLET | ORAL | 1 refills | Status: DC

## 2020-08-02 ENCOUNTER — Other Ambulatory Visit: Payer: Self-pay

## 2020-08-02 DIAGNOSIS — I10 Essential (primary) hypertension: Secondary | ICD-10-CM

## 2020-08-02 DIAGNOSIS — I252 Old myocardial infarction: Secondary | ICD-10-CM

## 2020-08-02 MED ORDER — CARVEDILOL 25 MG PO TABS: 25.0000 mg | ORAL_TABLET | Freq: Two times a day (BID) | ORAL | 1 refills | Status: DC

## 2020-08-02 MED ORDER — BENAZEPRIL HCL 40 MG PO TABS: 40.0000 mg | ORAL_TABLET | Freq: Every day | ORAL | 1 refills | Status: DC

## 2020-08-05 ENCOUNTER — Ambulatory Visit: Payer: Medicaid Other | Admitting: Family Medicine

## 2020-08-12 ENCOUNTER — Encounter: Payer: Self-pay | Admitting: Internal Medicine

## 2020-08-12 ENCOUNTER — Ambulatory Visit (INDEPENDENT_AMBULATORY_CARE_PROVIDER_SITE_OTHER): Payer: Medicaid Other | Admitting: Internal Medicine

## 2020-08-12 ENCOUNTER — Other Ambulatory Visit: Payer: Self-pay

## 2020-08-12 VITALS — BP 130/68 | HR 56 | Temp 97.7°F | Resp 17 | Ht 64.0 in | Wt 193.4 lb

## 2020-08-12 DIAGNOSIS — E782 Mixed hyperlipidemia: Secondary | ICD-10-CM

## 2020-08-12 DIAGNOSIS — F39 Unspecified mood [affective] disorder: Secondary | ICD-10-CM | POA: Diagnosis not present

## 2020-08-12 DIAGNOSIS — I252 Old myocardial infarction: Secondary | ICD-10-CM

## 2020-08-12 DIAGNOSIS — E119 Type 2 diabetes mellitus without complications: Secondary | ICD-10-CM | POA: Diagnosis not present

## 2020-08-12 DIAGNOSIS — F419 Anxiety disorder, unspecified: Secondary | ICD-10-CM | POA: Insufficient documentation

## 2020-08-12 DIAGNOSIS — I10 Essential (primary) hypertension: Secondary | ICD-10-CM | POA: Diagnosis not present

## 2020-08-12 DIAGNOSIS — M5441 Lumbago with sciatica, right side: Secondary | ICD-10-CM | POA: Diagnosis not present

## 2020-08-12 DIAGNOSIS — E6609 Other obesity due to excess calories: Secondary | ICD-10-CM | POA: Diagnosis not present

## 2020-08-12 DIAGNOSIS — J301 Allergic rhinitis due to pollen: Secondary | ICD-10-CM

## 2020-08-12 DIAGNOSIS — I2510 Atherosclerotic heart disease of native coronary artery without angina pectoris: Secondary | ICD-10-CM

## 2020-08-12 DIAGNOSIS — Z23 Encounter for immunization: Secondary | ICD-10-CM

## 2020-08-12 DIAGNOSIS — Z6833 Body mass index (BMI) 33.0-33.9, adult: Secondary | ICD-10-CM

## 2020-08-12 MED ORDER — CYCLOBENZAPRINE HCL 10 MG PO TABS: 10.0000 mg | ORAL_TABLET | Freq: Three times a day (TID) | ORAL | 0 refills | Status: DC | PRN

## 2020-08-12 MED ORDER — HYDROCODONE-ACETAMINOPHEN 5-325 MG PO TABS: 1.0000 | ORAL_TABLET | Freq: Three times a day (TID) | ORAL | 0 refills | Status: DC | PRN

## 2020-08-12 NOTE — Assessment & Plan Note (Signed)
A1c today No urine microalbumin secondary to ACEI therapy Encouraged him to consume a low-carb diet and exercise weight loss Foot exam today Encourage routine eye exams, will request copy of last diabetic eye exam Flu UTD COVID UTD Pneumovax today

## 2020-08-12 NOTE — Patient Instructions (Signed)
Sciatica Rehab Ask your health care provider which exercises are safe for you. Do exercises exactly as told by your health care provider and adjust them as directed. It is normal to feel mild stretching, pulling, tightness, or discomfort as you do these exercises. Stop right away if you feel sudden pain or your pain gets worse. Do not begin these exercises until told by your health care provider. Stretching and range-of-motion exercises These exercises warm up your muscles and joints and improve the movement and flexibility of your hips and back. These exercises also help to relieve pain, numbness, and tingling. Sciatic nerve glide 1. Sit in a chair with your head facing down toward your chest. Place your hands behind your back. Let your shoulders slump forward. 2. Slowly straighten one of your legs while you tilt your head back as if you are looking toward the ceiling. Only straighten your leg as far as you can without making your symptoms worse. 3. Hold this position for __________ seconds. 4. Slowly return to the starting position. 5. Repeat with your other leg. Repeat __________ times. Complete this exercise __________ times a day. Knee to chest with hip adduction and internal rotation 1. Lie on your back on a firm surface with both legs straight. 2. Bend one of your knees and move it up toward your chest until you feel a gentle stretch in your lower back and buttock. Then, move your knee toward the shoulder that is on the opposite side from your leg. This is hip adduction and internal rotation. ? Hold your leg in this position by holding on to the front of your knee. 3. Hold this position for __________ seconds. 4. Slowly return to the starting position. 5. Repeat with your other leg. Repeat __________ times. Complete this exercise __________ times a day.   Prone extension on elbows 1. Lie on your abdomen on a firm surface. A bed may be too soft for this exercise. 2. Prop yourself up on  your elbows. 3. Use your arms to help lift your chest up until you feel a gentle stretch in your abdomen and your lower back. ? This will place some of your body weight on your elbows. If this is uncomfortable, try stacking pillows under your chest. ? Your hips should stay down, against the surface that you are lying on. Keep your hip and back muscles relaxed. 4. Hold this position for __________ seconds. 5. Slowly relax your upper body and return to the starting position. Repeat __________ times. Complete this exercise __________ times a day.   Strengthening exercises These exercises build strength and endurance in your back. Endurance is the ability to use your muscles for a long time, even after they get tired. Pelvic tilt This exercise strengthens the muscles that lie deep in the abdomen. 1. Lie on your back on a firm surface. Bend your knees and keep your feet flat on the floor. 2. Tense your abdominal muscles. Tip your pelvis up toward the ceiling and flatten your lower back into the floor. ? To help with this exercise, you may place a small towel under your lower back and try to push your back into the towel. 3. Hold this position for __________ seconds. 4. Let your muscles relax completely before you repeat this exercise. Repeat __________ times. Complete this exercise __________ times a day. Alternating arm and leg raises 1. Get on your hands and knees on a firm surface. If you are on a hard floor, you may want to  use padding, such as an exercise mat, to cushion your knees. 2. Line up your arms and legs. Your hands should be directly below your shoulders, and your knees should be directly below your hips. 3. Lift your left leg behind you. At the same time, raise your right arm and straighten it in front of you. ? Do not lift your leg higher than your hip. ? Do not lift your arm higher than your shoulder. ? Keep your abdominal and back muscles tight. ? Keep your hips facing the  ground. ? Do not arch your back. ? Keep your balance carefully, and do not hold your breath. 4. Hold this position for __________ seconds. 5. Slowly return to the starting position. 6. Repeat with your right leg and your left arm. Repeat __________ times. Complete this exercise __________ times a day.   Posture and body mechanics Good posture and healthy body mechanics can help to relieve stress in your body's tissues and joints. Body mechanics refers to the movements and positions of your body while you do your daily activities. Posture is part of body mechanics. Good posture means:  Your spine is in its natural S-curve position (neutral).  Your shoulders are pulled back slightly.  Your head is not tipped forward. Follow these guidelines to improve your posture and body mechanics in your everyday activities. Standing  When standing, keep your spine neutral and your feet about hip width apart. Keep a slight bend in your knees. Your ears, shoulders, and hips should line up.  When you do a task in which you stand in one place for a long time, place one foot up on a stable object that is 2-4 inches (5-10 cm) high, such as a footstool. This helps keep your spine neutral.   Sitting  When sitting, keep your spine neutral and keep your feet flat on the floor. Use a footrest, if necessary, and keep your thighs parallel to the floor. Avoid rounding your shoulders, and avoid tilting your head forward.  When working at a desk or a computer, keep your desk at a height where your hands are slightly lower than your elbows. Slide your chair under your desk so you are close enough to maintain good posture.  When working at a computer, place your monitor at a height where you are looking straight ahead and you do not have to tilt your head forward or downward to look at the screen.   Resting  When lying down and resting, avoid positions that are most painful for you.  If you have pain with activities  such as sitting, bending, stooping, or squatting, lie in a position in which your body does not bend very much. For example, avoid curling up on your side with your arms and knees near your chest (fetal position).  If you have pain with activities such as standing for a long time or reaching with your arms, lie with your spine in a neutral position and bend your knees slightly. Try the following positions: ? Lying on your side with a pillow between your knees. ? Lying on your back with a pillow under your knees. Lifting  When lifting objects, keep your feet at least shoulder width apart and tighten your abdominal muscles.  Bend your knees and hips and keep your spine neutral. It is important to lift using the strength of your legs, not your back. Do not lock your knees straight out.  Always ask for help to lift heavy or awkward objects.  This information is not intended to replace advice given to you by your health care provider. Make sure you discuss any questions you have with your health care provider. Document Revised: 07/29/2018 Document Reviewed: 04/28/2018 Elsevier Patient Education  2021 [employer]vier Inc.  

## 2020-08-12 NOTE — Assessment & Plan Note (Signed)
No current chest pain Continue Atorvastatin, Fenofibrate, Isosorbide, Carvedilol and Aspirin C-Met and lipid profile today We will monitor

## 2020-08-12 NOTE — Assessment & Plan Note (Signed)
Persistent despite medication therapy with Fluoxetine and Hydroxyzine  Support offered today Referral to psychology placed for CBT

## 2020-08-12 NOTE — Assessment & Plan Note (Signed)
C-Met and lipid profile today Encouraged him to consume a low-fat diet Continue Atorvastatin and Fenofibrate as prescribed

## 2020-08-12 NOTE — Progress Notes (Signed)
Subjective:    Patient ID: Calvin Ortiz, male    DOB: 11/13/1970, 50 y.o.   MRN: [id number]  HPI  Patient presents to clinic today for 2519-month follow-up of chronic conditions.  He is establishing care with me today[facility]  DM2: His last A1c was 5.3%, 01/2020.  He is not currently taking any oral diabetic medication at this time.  He is on Benazepril for renal protection.  He does not check his sugars.  He checks his feet routinely.  His last [facility].  Flu 01/2020.  Pneumovax never.  COVID- Moderna x 3.  HLD with CAD status post MI: His last LDL was 94, triglycerides were 294, 07/2019.  He is taking Atorvastatin, Fenofibrate, Isosorbide, Carvedilol and Aspirin as prescribed.  He denies myalgias. He does not follow with cardiology.  HTN: His BP today is 142/72.  He reports his BP is typically normal at home but he is in significant pain today.  He is taking Amlodipine, Benazepril and Carvedilol as prescribed.  ECG from 04/2020 reviewed.  Seasonal Allergies: Worse in the spring. Managed on Fexofenadine and Flonase.  Mood Disorder: He is taking Fluoxetine and Hydroxyzine as prescribed.  He is not currently seeing a therapist but would like a referral for one.  He denies SI/HI.  GERD: He denies breakthrough on Pantoprazole.  There is no upper GI on file.  He reports he fell a few days ago.  He reports right-sided low back pain.  He describes the pain as sharp and shooting.  Pain radiates down his right leg.  He reports associated numbness and tingling but denies weakness.  The pain is worse with sitting or laying on his back. He is taking Baclofen and Tylenol arthritis as needed without any relief.  He has also tried heat with minimal relief of symptoms.  He reports history of 2 previous back surgeries.  Review of Systems   Past Medical History:  Diagnosis Date  . Anxiety   . Depression   . Diabetes mellitus  without complication (HCC)   . GERD (gastroesophageal reflux disease)   . Hyperlipidemia   . Hypertension   . Migraines   . Myocardial infarction (HCC)    3 Stents  . Sleep apnea   . Stroke [facility](HCC)     Current Outpatient Medications  Medication Sig Dispense Refill  . amLODipine (NORVASC) 10 MG tablet TAKE 1 TABLET(10 MG) BY MOUTH DAILY 90 tablet 1  . aspirin EC 81 MG tablet Take 81 mg by mouth daily.    Marland Kitchen. atorvastatin (LIPITOR) 40 MG tablet TAKE 1 TABLET(40 MG) BY MOUTH DAILY 90 tablet 2  . baclofen (LIORESAL) 10 MG tablet TAKE 1 TABLET(10 MG) BY MOUTH THREE TIMES DAILY 30 tablet 0  . benazepril (LOTENSIN) 40 MG tablet Take 1 tablet (40 mg total) by mouth daily. 90 tablet 1  . carvedilol (COREG) 25 MG tablet Take 1 tablet (25 mg total) by mouth 2 (two) times daily with a meal. 180 tablet 1  . cyclobenzaprine (FLEXERIL) 10 MG tablet Take 0.5-1 tablets (5-10 mg total) by mouth 3 (three) times daily as needed for muscle spasms. 30 tablet 0  . fenofibrate (TRICOR) 145 MG tablet TAKE 1 TABLET(145 MG) BY MOUTH DAILY 90 tablet 1  . fexofenadine (ALLEGRA) 180 MG tablet Take 1 tablet (180 mg total) by mouth daily. 90 tablet 3  . FLUoxetine (PROZAC) 20 MG capsule Take 1 capsule (20  mg total) by mouth daily. 90 capsule 0  . fluticasone (FLONASE) 50 MCG/ACT nasal spray 2 sprays in each nostril daily 16 g 3  . hydrOXYzine (ATARAX/VISTARIL) 25 MG tablet TAKE 2 TABLETS BY MOUTH EVERY NIGHT AT BEDTIME. MAY ALSO TAKE 1/2- 1 TABLET 2 TIMES DAILY AS NEEDED FOR ANXIETY 120 tablet 2  . isosorbide mononitrate (IMDUR) 120 MG 24 hr tablet Take 1 tablet (120 mg total) by mouth daily. 90 tablet 1  . nitroGLYCERIN (NITROSTAT) 0.4 MG SL tablet Place 1 tablet (0.4 mg total) under the tongue every 5 (five) minutes as needed for chest pain (Take up to 3 doses). 50 tablet 3  . ondansetron (ZOFRAN ODT) 4 MG disintegrating tablet Take 1 tablet (4 mg total) by mouth every 8 (eight) hours as needed for nausea or vomiting. 12  tablet 0  . oxyCODONE-acetaminophen (PERCOCET) 5-325 MG tablet Take 1-2 tablets by mouth every 6 (six) hours as needed for moderate pain or severe pain. 15 tablet 0  . pantoprazole (PROTONIX) 20 MG tablet Take 1 tablet (20 mg total) by mouth daily. 90 tablet 1   No current facility-administered medications for this visit.    Allergies  Allergen Reactions  . Buprenorphine Hcl     Other reaction(s): Other (See Comments), Other (See Comments)  . Morphine And Related   . Sulfa Antibiotics Rash    Family History  Problem Relation Age of Onset  . COPD Mother   . Hypertension Mother   . Hyperlipidemia Mother   . Depression Mother   . Heart disease Mother   . COPD Father   . Diabetes Father   . Heart disease Father   . Stroke Father   . Cancer Father        pelvic mass  . Lung cancer Father   . Depression Father   . Diabetes Paternal Grandmother     Social History   Socioeconomic History  . Marital status: Single    Spouse name: Not on file  . Number of children: Not on file  . Years of education: Not on file  . Highest education level: Not on file  Occupational History  . Not on file  Tobacco Use  . Smoking status: Former Smoker    Packs/day: 0.00    Years: 25.00    Pack years: 0.00    Types: Cigarettes    Quit date: 04/13/2018    Years since quitting: 2.3  . Smokeless tobacco: Never Used  . Tobacco comment: Has quit off and on about 5-6 times (09/2017)  Vaping Use  . Vaping Use: Never used  Substance and Sexual Activity  . Alcohol use: No  . Drug use: No  . Sexual activity: Not Currently  Other Topics Concern  . Not on file  Social History Narrative  . Not on file   Social Determinants of Health   Financial Resource Strain: Not on file  Food Insecurity: Not on file  Transportation Needs: Not on file  Physical Activity: Not on file  Stress: Not on file  Social Connections: Not on file  Intimate Partner Violence: Not on file     Constitutional:  Denies fever, malaise, fatigue, headache or abrupt weight changes.  HEENT: Denies eye pain, eye redness, ear pain, ringing in the ears, wax buildup, runny nose, nasal congestion, bloody nose, or sore throat. Respiratory: Denies difficulty breathing, shortness of breath, cough or sputum production.   Cardiovascular: Denies chest pain, chest tightness, palpitations or swelling in the hands or  feet.  Musculoskeletal: Patient reports right-sided low back pain.  Denies difficulty with gait, or joint swelling.  Skin: Denies redness, rashes, lesions or ulcercations.  Neurological: Patient reports numbness and tingling of RLE.  Denies dizziness, difficulty with memory, difficulty with speech or problems with balance and coordination.  Psych: Patient has a history of anxiety and depression.  Denies SI/HI.  No other specific complaints in a complete review of systems (except as listed in HPI above).  Objective:   Physical Exam  BP 130/68 (BP Location: Left Arm, Patient Position: Sitting, Cuff Size: Normal)   Pulse (!) 56   Temp 97.7 F (36.5 C) (Temporal)   Resp 17   Ht 5\' 4"  (1.626 m)   Wt 193 lb 6.4 oz (87.7 kg)   SpO2 99%   BMI 33.20 kg/m   Wt Readings from Last 3 Encounters:  05/07/20 203 lb (92.1 kg)  02/05/20 209 lb 3.2 oz (94.9 kg)  08/03/19 216 lb 6.4 oz (98.2 kg)    General: Appears his stated age, obese, in NAD. Skin: Warm, dry and intact. No ulcerations noted. HEENT: Head: normal shape and size; Eyes: sclera white, no icterus, conjunctiva pink, PERRLA and EOMs intact;  Cardiovascular: Normal rate and rhythm. S1,S2 noted.  No murmur, rubs or gallops noted. No JVD or BLE edema.  Pedal pulses 2+ bilaterally.  No carotid bruits noted. Pulmonary/Chest: Normal effort and positive vesicular breath sounds. No respiratory distress. No wheezes, rales or ronchi noted.  Musculoskeletal: Decreased flexion, rotation to the left of the lumbar spine.  Normal extension, rotation to the right and  lateral bending bilaterally.  He is able to stand on tiptoes and heels.  No difficulty with gait.  Neurological: Alert and oriented.  Positive SLR on the right.  Decreased sensation to the right heel with monofilament testing.  Normal sensation of the left Psychiatric: Mood and affect normal. Behavior is normal. Judgment and thought content normal.    BMET    Component Value Date/Time   NA 139 05/07/2020 1612   NA 138 12/10/2016 1902   NA 140 02/03/2012 0313   K 3.1 (L) 05/07/2020 1612   K 3.5 02/03/2012 0313   CL 107 05/07/2020 1612   CL 106 02/03/2012 0313   CO2 24 05/07/2020 1612   CO2 22 02/03/2012 0313   GLUCOSE 98 05/07/2020 1612   GLUCOSE 151 (H) 02/03/2012 0313   BUN 15 05/07/2020 1612   BUN 15 12/10/2016 1902   BUN 9 02/03/2012 0313   CREATININE 1.00 05/07/2020 1612   CREATININE 1.28 08/17/2019 0948   CALCIUM 8.9 05/07/2020 1612   CALCIUM 9.2 02/03/2012 0313   GFRNONAA >60 05/07/2020 1612   GFRNONAA 66 08/17/2019 0948   GFRAA 77 08/17/2019 0948    Lipid Panel     Component Value Date/Time   CHOL 170 08/17/2019 0948   CHOL 139 12/10/2016 1902   TRIG 291 (H) 08/17/2019 0948   HDL 34 (L) 08/17/2019 0948   HDL 22 (L) 12/10/2016 1902   CHOLHDL 5.0 (H) 08/17/2019 0948   LDLCALC 94 08/17/2019 0948    CBC    Component Value Date/Time   WBC 14.9 (H) 05/07/2020 1612   RBC 5.60 05/07/2020 1612   HGB 15.9 05/07/2020 1612   HGB 14.9 04/02/2016 1939   HCT 47.1 05/07/2020 1612   HCT 43.6 04/02/2016 1939   PLT 344 05/07/2020 1612   PLT 268 04/02/2016 1939   MCV 84.1 05/07/2020 1612   MCV 86 04/02/2016 1939  MCV 87 02/03/2012 0313   MCH 28.4 05/07/2020 1612   MCHC 33.8 05/07/2020 1612   RDW 13.3 05/07/2020 1612   RDW 13.3 04/02/2016 1939   RDW 13.0 02/03/2012 0313   LYMPHSABS 3,717 08/17/2019 0948   LYMPHSABS 3.3 (H) 04/02/2016 1939   EOSABS 351 08/17/2019 0948   EOSABS 0.3 04/02/2016 1939   BASOSABS 99 08/17/2019 0948   BASOSABS 0.1 04/02/2016 1939     Hgb A1C Lab Results  Component Value Date   HGBA1C 5.3 02/05/2020            Assessment & Plan:   Acute Low Back Pain with Right-Sided Sciatica:  Encouraged him to take ibuprofen 600 mg every 8 hours as needed with food to decrease inflammation Rx for Flexeril 10 mg 3 times daily as needed, sedation caution given.  Advised him to not take this along with the baclofen. Rx for Hydrocodone 5-325 mg p.o. 3 times daily as needed, #15, 0 refills.  PDMP reviewed. Encouraged stretching, handout given. Advised him to use ice instead of heat  RTC in 6 months for annual exam Nicki Reaper, NP This visit occurred during the SARS-CoV-2 public health emergency.  Safety protocols were in place, including screening questions prior to the visit, additional usage of staff PPE, and extensive cleaning of exam room while observing appropriate contact time as indicated for disinfecting solutions.

## 2020-08-12 NOTE — Assessment & Plan Note (Signed)
Continue Fexofenadine and Flonase

## 2020-08-12 NOTE — Assessment & Plan Note (Signed)
Goal <130/80 Elevated today but he reports he is in significant pain Continue Amlodipine, Benazepril and Carvedilol Reinforced DASH diet and exercise weight loss C-Met today

## 2020-08-21 ENCOUNTER — Other Ambulatory Visit: Payer: Self-pay

## 2020-08-21 DIAGNOSIS — I252 Old myocardial infarction: Secondary | ICD-10-CM

## 2020-08-21 DIAGNOSIS — I10 Essential (primary) hypertension: Secondary | ICD-10-CM

## 2020-08-21 DIAGNOSIS — K219 Gastro-esophageal reflux disease without esophagitis: Secondary | ICD-10-CM

## 2020-08-21 DIAGNOSIS — E7849 Other hyperlipidemia: Secondary | ICD-10-CM

## 2020-08-21 MED ORDER — ATORVASTATIN CALCIUM 40 MG PO TABS: ORAL_TABLET | ORAL | 2 refills | Status: DC

## 2020-08-21 MED ORDER — BENAZEPRIL HCL 40 MG PO TABS: 40.0000 mg | ORAL_TABLET | Freq: Every day | ORAL | 1 refills | Status: DC

## 2020-08-21 MED ORDER — AMLODIPINE BESYLATE 10 MG PO TABS: ORAL_TABLET | ORAL | 1 refills | Status: DC

## 2020-08-21 MED ORDER — ISOSORBIDE MONONITRATE ER 120 MG PO TB24: 120.0000 mg | ORAL_TABLET | Freq: Every day | ORAL | 1 refills | Status: DC

## 2020-08-21 MED ORDER — CARVEDILOL 25 MG PO TABS: 25.0000 mg | ORAL_TABLET | Freq: Two times a day (BID) | ORAL | 1 refills | Status: DC

## 2020-08-21 MED ORDER — PANTOPRAZOLE SODIUM 20 MG PO TBEC: 20.0000 mg | DELAYED_RELEASE_TABLET | Freq: Every day | ORAL | 1 refills | Status: DC

## 2020-08-24 ENCOUNTER — Other Ambulatory Visit: Payer: Self-pay | Admitting: Family Medicine

## 2020-08-25 NOTE — Telephone Encounter (Signed)
Requested medication (s) are due for refill today: yes  Requested medication (s) are on the active medication list: yes  Last refill:  08/01/20  Future visit scheduled: yes  Notes to clinic:  med not delegated to NT to RF   Requested Prescriptions  Pending Prescriptions Disp Refills   baclofen (LIORESAL) 10 MG tablet [Pharmacy Med Name: BACLOFEN 10MG  TABLETS] 30 tablet 0    Sig: TAKE 1 TABLET(10 MG) BY MOUTH THREE TIMES DAILY      Not Delegated - Analgesics:  Muscle Relaxants Failed - 08/24/2020 11:32 PM      Failed - This refill cannot be delegated      Passed - Valid encounter within last 6 months    Recent Outpatient Visits           1 week ago Coronary artery disease involving native coronary artery of native heart without angina pectoris   [facility] [place], Mullins W, NP   3 months ago Acute left-sided low back pain with left-sided sciatica   [facility], [facility], FNP   6 months ago Diabetes mellitus without complication [facility])   [facility], [facility], FNP   1 year ago Diabetes mellitus without complication [facility])   [facility], [facility], FNP   1 year ago Diabetes mellitus without complication [facility])   [facility], Kyung Rudd, NP       Future Appointments             In 5 months [name], Alison Stalling, NP [facility], [facility]

## 2020-08-27 ENCOUNTER — Other Ambulatory Visit: Payer: Self-pay

## 2020-08-27 ENCOUNTER — Encounter: Payer: Self-pay | Admitting: Internal Medicine

## 2020-08-27 ENCOUNTER — Ambulatory Visit
Admission: RE | Admit: 2020-08-27 | Discharge: 2020-08-27 | Disposition: A | Discharge status: Home / Self Care | Payer: Medicaid Other | Source: Ambulatory Visit | Attending: Internal Medicine | Admitting: Internal Medicine

## 2020-08-27 ENCOUNTER — Ambulatory Visit: Payer: Medicaid Other | Admitting: Internal Medicine

## 2020-08-27 ENCOUNTER — Ambulatory Visit
Admission: RE | Admit: 2020-08-27 | Discharge: 2020-08-27 | Disposition: A | Discharge status: Home / Self Care | Payer: Medicaid Other | Attending: Internal Medicine | Admitting: Internal Medicine

## 2020-08-27 VITALS — BP 131/80 | HR 57 | Temp 97.7°F | Resp 16 | Ht 64.0 in | Wt 190.8 lb

## 2020-08-27 DIAGNOSIS — M5441 Lumbago with sciatica, right side: Secondary | ICD-10-CM | POA: Insufficient documentation

## 2020-08-27 DIAGNOSIS — M25551 Pain in right hip: Secondary | ICD-10-CM | POA: Diagnosis not present

## 2020-08-27 DIAGNOSIS — E6609 Other obesity due to excess calories: Secondary | ICD-10-CM

## 2020-08-27 DIAGNOSIS — Z6832 Body mass index (BMI) 32.0-32.9, adult: Secondary | ICD-10-CM | POA: Diagnosis not present

## 2020-08-27 DIAGNOSIS — M545 Low back pain, unspecified: Secondary | ICD-10-CM | POA: Diagnosis not present

## 2020-08-27 MED ORDER — CYCLOBENZAPRINE HCL 10 MG PO TABS
10.0000 mg | ORAL_TABLET | Freq: Three times a day (TID) | ORAL | 0 refills | Status: DC | PRN
Start: 2020-08-27 — End: 2020-09-05

## 2020-08-27 MED ORDER — PREDNISONE 10 MG PO TABS
ORAL_TABLET | ORAL | 0 refills | Status: DC
Start: 2020-08-27 — End: 2020-09-19

## 2020-08-27 NOTE — Patient Instructions (Signed)
Sciatica Rehab Ask your health care provider which exercises are safe for you. Do exercises exactly as told by your health care provider and adjust them as directed. It is normal to feel mild stretching, pulling, tightness, or discomfort as you do these exercises. Stop right away if you feel sudden pain or your pain gets worse. Do not begin these exercises until told by your health care provider. Stretching and range-of-motion exercises These exercises warm up your muscles and joints and improve the movement and flexibility of your hips and back. These exercises also help to relieve pain, numbness, and tingling. Sciatic nerve glide 1. Sit in a chair with your head facing down toward your chest. Place your hands behind your back. Let your shoulders slump forward. 2. Slowly straighten one of your legs while you tilt your head back as if you are looking toward the ceiling. Only straighten your leg as far as you can without making your symptoms worse. 3. Hold this position for __________ seconds. 4. Slowly return to the starting position. 5. Repeat with your other leg. Repeat __________ times. Complete this exercise __________ times a day. Knee to chest with hip adduction and internal rotation 1. Lie on your back on a firm surface with both legs straight. 2. Bend one of your knees and move it up toward your chest until you feel a gentle stretch in your lower back and buttock. Then, move your knee toward the shoulder that is on the opposite side from your leg. This is hip adduction and internal rotation. ? Hold your leg in this position by holding on to the front of your knee. 3. Hold this position for __________ seconds. 4. Slowly return to the starting position. 5. Repeat with your other leg. Repeat __________ times. Complete this exercise __________ times a day.   Prone extension on elbows 1. Lie on your abdomen on a firm surface. A bed may be too soft for this exercise. 2. Prop yourself up on  your elbows. 3. Use your arms to help lift your chest up until you feel a gentle stretch in your abdomen and your lower back. ? This will place some of your body weight on your elbows. If this is uncomfortable, try stacking pillows under your chest. ? Your hips should stay down, against the surface that you are lying on. Keep your hip and back muscles relaxed. 4. Hold this position for __________ seconds. 5. Slowly relax your upper body and return to the starting position. Repeat __________ times. Complete this exercise __________ times a day.   Strengthening exercises These exercises build strength and endurance in your back. Endurance is the ability to use your muscles for a long time, even after they get tired. Pelvic tilt This exercise strengthens the muscles that lie deep in the abdomen. 1. Lie on your back on a firm surface. Bend your knees and keep your feet flat on the floor. 2. Tense your abdominal muscles. Tip your pelvis up toward the ceiling and flatten your lower back into the floor. ? To help with this exercise, you may place a small towel under your lower back and try to push your back into the towel. 3. Hold this position for __________ seconds. 4. Let your muscles relax completely before you repeat this exercise. Repeat __________ times. Complete this exercise __________ times a day. Alternating arm and leg raises 1. Get on your hands and knees on a firm surface. If you are on a hard floor, you may want to  use padding, such as an exercise mat, to cushion your knees. 2. Line up your arms and legs. Your hands should be directly below your shoulders, and your knees should be directly below your hips. 3. Lift your left leg behind you. At the same time, raise your right arm and straighten it in front of you. ? Do not lift your leg higher than your hip. ? Do not lift your arm higher than your shoulder. ? Keep your abdominal and back muscles tight. ? Keep your hips facing the  ground. ? Do not arch your back. ? Keep your balance carefully, and do not hold your breath. 4. Hold this position for __________ seconds. 5. Slowly return to the starting position. 6. Repeat with your right leg and your left arm. Repeat __________ times. Complete this exercise __________ times a day.   Posture and body mechanics Good posture and healthy body mechanics can help to relieve stress in your body's tissues and joints. Body mechanics refers to the movements and positions of your body while you do your daily activities. Posture is part of body mechanics. Good posture means:  Your spine is in its natural S-curve position (neutral).  Your shoulders are pulled back slightly.  Your head is not tipped forward. Follow these guidelines to improve your posture and body mechanics in your everyday activities. Standing  When standing, keep your spine neutral and your feet about hip width apart. Keep a slight bend in your knees. Your ears, shoulders, and hips should line up.  When you do a task in which you stand in one place for a long time, place one foot up on a stable object that is 2-4 inches (5-10 cm) high, such as a footstool. This helps keep your spine neutral.   Sitting  When sitting, keep your spine neutral and keep your feet flat on the floor. Use a footrest, if necessary, and keep your thighs parallel to the floor. Avoid rounding your shoulders, and avoid tilting your head forward.  When working at a desk or a computer, keep your desk at a height where your hands are slightly lower than your elbows. Slide your chair under your desk so you are close enough to maintain good posture.  When working at a computer, place your monitor at a height where you are looking straight ahead and you do not have to tilt your head forward or downward to look at the screen.   Resting  When lying down and resting, avoid positions that are most painful for you.  If you have pain with activities  such as sitting, bending, stooping, or squatting, lie in a position in which your body does not bend very much. For example, avoid curling up on your side with your arms and knees near your chest (fetal position).  If you have pain with activities such as standing for a long time or reaching with your arms, lie with your spine in a neutral position and bend your knees slightly. Try the following positions: ? Lying on your side with a pillow between your knees. ? Lying on your back with a pillow under your knees. Lifting  When lifting objects, keep your feet at least shoulder width apart and tighten your abdominal muscles.  Bend your knees and hips and keep your spine neutral. It is important to lift using the strength of your legs, not your back. Do not lock your knees straight out.  Always ask for help to lift heavy or awkward objects.  This information is not intended to replace advice given to you by your health care provider. Make sure you discuss any questions you have with your health care provider. Document Revised: 07/29/2018 Document Reviewed: 04/28/2018 Elsevier Patient Education  2021 [employer]vier Inc.  

## 2020-08-27 NOTE — Progress Notes (Signed)
Subjective:    Patient ID: Calvin Ortiz, male    DOB: 05/07/1970, 50 y.o.   MRN: [id number]  HPI  Pt presents to the clinic today to follow up right low back pain. This started after a fall in the middle of April. He describes the pain as sharp and shooting. The pain radiates into his right hip and down his right leg. He reports associated numbness and tingling but denies weakness. Sitting or standing for long periods of time make his pain worse. He denies loss of bowel or bladder control. He was seen 4/25 for the same. He was advised to take Ibuprofen, given a RX for Flexeril and Hydrocodone. He reports improvement in his pain when he was taking the medication but as soon as he stopped, the pain returned. He has had a prior normal xray of his right hip 12/2015. Lumbar xray from 10/2014 showed:  IMPRESSION: No acute fracture or subluxation. Mild disc space flattening with endplate sclerotic changes at L4-L5 level.  Review of Systems      Past Medical History:  Diagnosis Date  . Anxiety   . Depression   . Diabetes mellitus without complication (HCC)   . GERD (gastroesophageal reflux disease)   . Hyperlipidemia   . Hypertension   . Migraines   . Myocardial infarction (HCC)    3 Stents  . Sleep apnea   . Stroke [facility])     Current Outpatient Medications  Medication Sig Dispense Refill  . amLODipine (NORVASC) 10 MG tablet TAKE 1 TABLET(10 MG) BY MOUTH DAILY 90 tablet 1  . aspirin EC 81 MG tablet Take 81 mg by mouth daily.    Marland Kitchen atorvastatin (LIPITOR) 40 MG tablet TAKE 1 TABLET(40 MG) BY MOUTH DAILY 90 tablet 2  . baclofen (LIORESAL) 10 MG tablet TAKE 1 TABLET(10 MG) BY MOUTH THREE TIMES DAILY 30 tablet 0  . benazepril (LOTENSIN) 40 MG tablet Take 1 tablet (40 mg total) by mouth daily. 90 tablet 1  . carvedilol (COREG) 25 MG tablet Take 1 tablet (25 mg total) by mouth 2 (two) times daily with a meal. 180 tablet 1  . cyclobenzaprine (FLEXERIL) 10 MG tablet Take 1 tablet (10 mg  total) by mouth 3 (three) times daily as needed for muscle spasms. 15 tablet 0  . fenofibrate (TRICOR) 145 MG tablet TAKE 1 TABLET(145 MG) BY MOUTH DAILY 90 tablet 1  . fexofenadine (ALLEGRA) 180 MG tablet Take 1 tablet (180 mg total) by mouth daily. 90 tablet 3  . FLUoxetine (PROZAC) 20 MG capsule Take 1 capsule (20 mg total) by mouth daily. 90 capsule 0  . fluticasone (FLONASE) 50 MCG/ACT nasal spray 2 sprays in each nostril daily 16 g 3  . HYDROcodone-acetaminophen (NORCO/VICODIN) 5-325 MG tablet Take 1 tablet by mouth every 8 (eight) hours as needed for moderate pain. 15 tablet 0  . hydrOXYzine (ATARAX/VISTARIL) 25 MG tablet TAKE 2 TABLETS BY MOUTH EVERY NIGHT AT BEDTIME. MAY ALSO TAKE 1/2- 1 TABLET 2 TIMES DAILY AS NEEDED FOR ANXIETY 120 tablet 2  . isosorbide mononitrate (IMDUR) 120 MG 24 hr tablet Take 1 tablet (120 mg total) by mouth daily. 90 tablet 1  . nitroGLYCERIN (NITROSTAT) 0.4 MG SL tablet Place 1 tablet (0.4 mg total) under the tongue every 5 (five) minutes as needed for chest pain (Take up to 3 doses). 50 tablet 3  . ondansetron (ZOFRAN ODT) 4 MG disintegrating tablet Take 1 tablet (4 mg total) by mouth every 8 (eight) hours as  needed for nausea or vomiting. 12 tablet 0  . pantoprazole (PROTONIX) 20 MG tablet Take 1 tablet (20 mg total) by mouth daily. 90 tablet 1   No current facility-administered medications for this visit.    Allergies  Allergen Reactions  . Buprenorphine Hcl     Other reaction(s): Other (See Comments), Other (See Comments)  . Morphine And Related   . Sulfa Antibiotics Rash    Family History  Problem Relation Age of Onset  . COPD Mother   . Hypertension Mother   . Hyperlipidemia Mother   . Depression Mother   . Heart disease Mother   . COPD Father   . Diabetes Father   . Heart disease Father   . Stroke Father   . Cancer Father        pelvic mass  . Lung cancer Father   . Depression Father   . Diabetes Paternal Grandmother     Social  History   Socioeconomic History  . Marital status: Single    Spouse name: Not on file  . Number of children: Not on file  . Years of education: Not on file  . Highest education level: Not on file  Occupational History  . Not on file  Tobacco Use  . Smoking status: Former Smoker    Packs/day: 0.00    Years: 25.00    Pack years: 0.00    Types: Cigarettes    Quit date: 04/13/2018    Years since quitting: 2.3  . Smokeless tobacco: Never Used  . Tobacco comment: Has quit off and on about 5-6 times (09/2017)  Vaping Use  . Vaping Use: Never used  Substance and Sexual Activity  . Alcohol use: No  . Drug use: No  . Sexual activity: Not Currently  Other Topics Concern  . Not on file  Social History Narrative  . Not on file   Social Determinants of Health   Financial Resource Strain: Not on file  Food Insecurity: Not on file  Transportation Needs: Not on file  Physical Activity: Not on file  Stress: Not on file  Social Connections: Not on file  Intimate Partner Violence: Not on file     Constitutional: Denies fever, malaise, fatigue, headache or abrupt weight changes.  Respiratory: Denies difficulty breathing, shortness of breath, cough or sputum production.   Cardiovascular: Denies chest pain, chest tightness, palpitations or swelling in the hands or feet.  Gastrointestinal: Denies abdominal pain, bloating, constipation, diarrhea or blood in the stool.  GU: Denies urgency, frequency, pain with urination, burning sensation, blood in urine, odor or discharge. Musculoskeletal: Pt reports right sided low back pain. Denies decrease in range of motion, difficulty with gait, or joint swelling.  Skin: Denies redness, rashes, lesions or ulcercations.  Neurological: Pt reports numbness and tingling in his right lower extremity. Denies weakness or problems with balance and coordination.   No other specific complaints in a complete review of systems (except as listed in HPI  above).  Objective:   Physical Exam  BP 131/80 (BP Location: Left Arm, Patient Position: Sitting, Cuff Size: Normal)   Pulse (!) 57   Temp 97.7 F (36.5 C) (Temporal)   Resp 16   Ht 5\' 4"  (1.626 m)   Wt 190 lb 12.8 oz (86.5 kg)   SpO2 100%   BMI 32.75 kg/m   Wt Readings from Last 3 Encounters:  08/12/20 193 lb 6.4 oz (87.7 kg)  05/07/20 203 lb (92.1 kg)  02/05/20 209 lb 3.2 oz (  94.9 kg)    General: Appears his stated age, obese, in NAD. Skin: Warm, dry and intact. No rashes noted. Cardiovascular: Bradycardic with normal rhythm. S1,S2 noted.  No murmur, rubs or gallops noted.  Pulmonary/Chest: Normal effort and positive vesicular breath sounds. No respiratory distress. No wheezes, rales or ronchi noted.  Musculoskeletal:  Decreased flexion of the spine secondary to pain. Normal extension, rotation and lateral bending of the spine. No bony tenderness noted over the spine. Pain with palpation over the right SI joint. Pain with abduction and internal rotation of the right hip. Normal flexion, extension, adduction and external rotation of the right hip. Pain with palpation over the right trochanter. Strength 4/5 RLE, 5/5 LLE. Using cane for assistance with gait. Able to stand on tip toes. Having trouble with standing on heels today. Neurological: Alert and oriented. Positive SLR on the right.   BMET    Component Value Date/Time   NA 139 05/07/2020 1612   NA 138 12/10/2016 1902   NA 140 02/03/2012 0313   K 3.1 (L) 05/07/2020 1612   K 3.5 02/03/2012 0313   CL 107 05/07/2020 1612   CL 106 02/03/2012 0313   CO2 24 05/07/2020 1612   CO2 22 02/03/2012 0313   GLUCOSE 98 05/07/2020 1612   GLUCOSE 151 (H) 02/03/2012 0313   BUN 15 05/07/2020 1612   BUN 15 12/10/2016 1902   BUN 9 02/03/2012 0313   CREATININE 1.00 05/07/2020 1612   CREATININE 1.28 08/17/2019 0948   CALCIUM 8.9 05/07/2020 1612   CALCIUM 9.2 02/03/2012 0313   GFRNONAA >60 05/07/2020 1612   GFRNONAA 66 08/17/2019 0948    GFRAA 77 08/17/2019 0948    Lipid Panel     Component Value Date/Time   CHOL 170 08/17/2019 0948   CHOL 139 12/10/2016 1902   TRIG 291 (H) 08/17/2019 0948   HDL 34 (L) 08/17/2019 0948   HDL 22 (L) 12/10/2016 1902   CHOLHDL 5.0 (H) 08/17/2019 0948   LDLCALC 94 08/17/2019 0948    CBC    Component Value Date/Time   WBC 14.9 (H) 05/07/2020 1612   RBC 5.60 05/07/2020 1612   HGB 15.9 05/07/2020 1612   HGB 14.9 04/02/2016 1939   HCT 47.1 05/07/2020 1612   HCT 43.6 04/02/2016 1939   PLT 344 05/07/2020 1612   PLT 268 04/02/2016 1939   MCV 84.1 05/07/2020 1612   MCV 86 04/02/2016 1939   MCV 87 02/03/2012 0313   MCH 28.4 05/07/2020 1612   MCHC 33.8 05/07/2020 1612   RDW 13.3 05/07/2020 1612   RDW 13.3 04/02/2016 1939   RDW 13.0 02/03/2012 0313   LYMPHSABS 3,717 08/17/2019 0948   LYMPHSABS 3.3 (H) 04/02/2016 1939   EOSABS 351 08/17/2019 0948   EOSABS 0.3 04/02/2016 1939   BASOSABS 99 08/17/2019 0948   BASOSABS 0.1 04/02/2016 1939    Hgb A1C Lab Results  Component Value Date   HGBA1C 5.3 02/05/2020            Assessment & Plan:   Acute Midline Low Back Pain with Right Sided Sciatica:  Xray lumbar spine today Xray right hip today RX for Pred Taper x 9 days Flexeril refilled today- sedation caution given Will not refill Hydrocodone at this time Encouraged ice for 10 minutes 2 x day Encouraged stretching If symptoms persist, will need to consider PT and MRI  Will follow up after xray, return precautions discussed  Nicki Reaper, NP This visit occurred during the SARS-CoV-2 public health  emergency.  Safety protocols were in place, including screening questions prior to the visit, additional usage of staff PPE, and extensive cleaning of exam room while observing appropriate contact time as indicated for disinfecting solutions.

## 2020-08-30 ENCOUNTER — Encounter: Payer: Self-pay | Admitting: Internal Medicine

## 2020-08-30 MED ORDER — TRAMADOL HCL 50 MG PO TABS
50.0000 mg | ORAL_TABLET | Freq: Three times a day (TID) | ORAL | 0 refills | Status: AC | PRN
Start: 2020-08-30 — End: 2020-09-04

## 2020-08-30 NOTE — Telephone Encounter (Signed)
Please review.  KP

## 2020-08-30 NOTE — Telephone Encounter (Signed)
Pt response.  KP

## 2020-09-05 ENCOUNTER — Other Ambulatory Visit: Payer: Self-pay | Admitting: Internal Medicine

## 2020-09-05 DIAGNOSIS — M5441 Lumbago with sciatica, right side: Secondary | ICD-10-CM

## 2020-09-05 NOTE — Telephone Encounter (Signed)
Requested medication (s) are due for refill today: Yes  Requested medication (s) are on the active medication list: Yes  Last refill:  08/27/20  Future visit scheduled: Yes  Notes to clinic:  See request.    Requested Prescriptions  Pending Prescriptions Disp Refills   cyclobenzaprine (FLEXERIL) 10 MG tablet [Pharmacy Med Name: CYCLOBENZAPRINE 10MG  TABLETS] 30 tablet 0    Sig: TAKE 1 TABLET(10 MG) BY MOUTH THREE TIMES DAILY AS NEEDED FOR MUSCLE SPASMS      Not Delegated - Analgesics:  Muscle Relaxants Failed - 09/05/2020  1:28 PM      Failed - This refill cannot be delegated      Passed - Valid encounter within last 6 months    Recent Outpatient Visits           1 week ago Acute midline low back pain with right-sided sciatica   [facility] [place], Mullins, NP   3 weeks ago Coronary artery disease involving native coronary artery of native heart without angina pectoris   [facility] [place], Mullins W, NP   4 months ago Acute left-sided low back pain with left-sided sciatica   [facility], [facility], FNP   7 months ago Diabetes mellitus without complication [facility])   [facility], [facility], FNP   1 year ago Diabetes mellitus without complication [facility])   [facility], [facility], FNP       Future Appointments             In 5 months [name], Jodelle Gross, NP [facility], [facility]

## 2020-09-09 ENCOUNTER — Other Ambulatory Visit: Payer: Self-pay | Admitting: Family Medicine

## 2020-09-09 DIAGNOSIS — F4010 Social phobia, unspecified: Secondary | ICD-10-CM

## 2020-09-17 ENCOUNTER — Other Ambulatory Visit: Payer: Self-pay | Admitting: Internal Medicine

## 2020-09-17 DIAGNOSIS — M5441 Lumbago with sciatica, right side: Secondary | ICD-10-CM

## 2020-09-17 NOTE — Telephone Encounter (Signed)
Requested medication (s) are due for refill today: yes  Requested medication (s) are on the active medication list:  yes  Last refill: 09/05/2020  Future visit scheduled: yes  Notes to clinic:  this refill cannot be delegated    Requested Prescriptions  Pending Prescriptions Disp Refills   cyclobenzaprine (FLEXERIL) 10 MG tablet [Pharmacy Med Name: CYCLOBENZAPRINE 10MG  TABLETS] 30 tablet 0    Sig: TAKE 1 TABLET(10 MG) BY MOUTH THREE TIMES DAILY AS NEEDED FOR MUSCLE SPASMS      Not Delegated - Analgesics:  Muscle Relaxants Failed - 09/17/2020  1:25 AM      Failed - This refill cannot be delegated      Passed - Valid encounter within last 6 months    Recent Outpatient Visits           3 weeks ago Acute midline low back pain with right-sided sciatica   [facility] [place], Mullins, NP   1 month ago Coronary artery disease involving native coronary artery of native heart without angina pectoris   [facility] [place], Mullins W, NP   4 months ago Acute left-sided low back pain with left-sided sciatica   [facility], [facility], FNP   7 months ago Diabetes mellitus without complication [facility])   [facility], [facility], FNP   1 year ago Diabetes mellitus without complication [facility])   [facility], [facility], FNP       Future Appointments             In 4 months [name], Jodelle Gross, NP [facility], [facility]

## 2020-09-17 NOTE — Telephone Encounter (Signed)
How often is he having to take this?

## 2020-09-19 ENCOUNTER — Encounter: Payer: Self-pay | Admitting: Internal Medicine

## 2020-09-19 ENCOUNTER — Other Ambulatory Visit: Payer: Self-pay

## 2020-09-19 ENCOUNTER — Other Ambulatory Visit: Payer: Self-pay | Admitting: Internal Medicine

## 2020-09-19 ENCOUNTER — Ambulatory Visit (INDEPENDENT_AMBULATORY_CARE_PROVIDER_SITE_OTHER): Payer: Medicaid Other | Admitting: Internal Medicine

## 2020-09-19 VITALS — BP 117/62 | HR 72 | Temp 98.6°F | Resp 18 | Ht 64.0 in | Wt 191.4 lb

## 2020-09-19 DIAGNOSIS — E6609 Other obesity due to excess calories: Secondary | ICD-10-CM | POA: Diagnosis not present

## 2020-09-19 DIAGNOSIS — G8929 Other chronic pain: Secondary | ICD-10-CM

## 2020-09-19 DIAGNOSIS — M5441 Lumbago with sciatica, right side: Secondary | ICD-10-CM

## 2020-09-19 DIAGNOSIS — Z6832 Body mass index (BMI) 32.0-32.9, adult: Secondary | ICD-10-CM | POA: Diagnosis not present

## 2020-09-19 DIAGNOSIS — F4010 Social phobia, unspecified: Secondary | ICD-10-CM

## 2020-09-19 DIAGNOSIS — E119 Type 2 diabetes mellitus without complications: Secondary | ICD-10-CM

## 2020-09-19 DIAGNOSIS — Z9889 Other specified postprocedural states: Secondary | ICD-10-CM

## 2020-09-19 MED ORDER — HYDROCODONE-ACETAMINOPHEN 5-325 MG PO TABS: 1.0000 | ORAL_TABLET | Freq: Three times a day (TID) | ORAL | 0 refills | Status: DC | PRN

## 2020-09-19 NOTE — Telephone Encounter (Signed)
Medication Refill - Medication: FLUoxetine (PROZAC) 20 MG capsule hydrOXYzine (ATARAX/VISTARIL) 25 MG tablet     Preferred Pharmacy (with phone number or street name): [id number] - [place], [place] - [address] AT [facility] OF [address]  Agent: Please be advised that RX refills may take up to 3 business days. We ask that you follow-up with your pharmacy.

## 2020-09-19 NOTE — Telephone Encounter (Signed)
Requested medication (s) are due for refill today: yes  Requested medication (s) are on the active medication list: yes  Last refill:  prozac 09/09/20 #90 0 refills, hydroxizine 05/13/20 #120 2 refills  Future visit scheduled: yes in 4 months , seen today   Notes to clinic:  last ordered by [facility]      Requested Prescriptions  Pending Prescriptions Disp Refills   FLUoxetine (PROZAC) 20 MG capsule 90 capsule 0      Psychiatry:  Antidepressants - SSRI Passed - 09/19/2020  4:33 PM      Passed - Valid encounter within last 6 months    Recent Outpatient Visits           Today Chronic midline low back pain with right-sided sciatica   [facility] [place] [place], Salvadore Oxford, NP   3 weeks ago Acute midline low back pain with right-sided sciatica   [facility] [place], Salvadore Oxford, NP   1 month ago Coronary artery disease involving native coronary artery of native heart without angina pectoris   [facility] [place], [place] W, NP   4 months ago Acute left-sided low back pain with left-sided sciatica   [facility], Jodelle Gross, FNP   7 months ago Diabetes mellitus without complication [facility])   [facility], Jodelle Gross, FNP       Future Appointments             In 4 months [name], Salvadore Oxford, NP [facility]               hydrOXYzine (ATARAX/VISTARIL) 25 MG tablet 120 tablet 2      Ear, Nose, and Throat:  Antihistamines Passed - 09/19/2020  4:33 PM      Passed - Valid encounter within last 12 months    Recent Outpatient Visits           Today Chronic midline low back pain with right-sided sciatica   [facility] [place], Salvadore Oxford, NP   3 weeks ago Acute midline low back pain with right-sided sciatica   [facility] [place], Salvadore Oxford, NP   1 month ago Coronary artery disease involving native coronary artery of native heart without angina pectoris   [facility] [place], [place] W, NP   4 months ago Acute left-sided low back pain with left-sided sciatica   [facility], Jodelle Gross, FNP   7 months ago Diabetes mellitus without complication [facility])   [facility], Jodelle Gross, FNP       Future Appointments             In 4 months [name], Salvadore Oxford, NP [facility], [facility]

## 2020-09-19 NOTE — Progress Notes (Signed)
Subjective:    Patient ID: Calvin Ortiz, male    DOB: [birth year], 50 y.o.   MRN: [id number]  HPI  Pt presents to the clinic today for follow up of back pain. He was seen 05/02/20, 08/12/20 and 08/27/20 for the same. Xray lumbar spine from 08/2020 showed:  IMPRESSION: 1. Progressive lower lumbar spondylosis and facet hypertrophy, most pronounced at L4-5. No acute bony abnormality.  He has been treated with Prednisone, Flexeril, Hydrocodone and Tramadol. It was recommended he pursue PT if pain persisted, but he has not done any PT at this point. He describes the pain as burning and stinging, worse when he extends his right leg.  The pain is also worse for sitting or standing for long periods of times.  He reports associated numbness and tingling but denies weakness.  He denies loss of bowel or bladder control.  Review of Systems      Past Medical History:  Diagnosis Date  . Anxiety   . Depression   . Diabetes mellitus without complication (HCC)   . GERD (gastroesophageal reflux disease)   . Hyperlipidemia   . Hypertension   . Migraines   . Myocardial infarction (HCC)    3 Stents  . Sleep apnea   . Stroke [facility])     Current Outpatient Medications  Medication Sig Dispense Refill  . amLODipine (NORVASC) 10 MG tablet TAKE 1 TABLET(10 MG) BY MOUTH DAILY 90 tablet 1  . aspirin EC 81 MG tablet Take 81 mg by mouth daily.    Marland Kitchen atorvastatin (LIPITOR) 40 MG tablet TAKE 1 TABLET(40 MG) BY MOUTH DAILY 90 tablet 2  . baclofen (LIORESAL) 10 MG tablet TAKE 1 TABLET(10 MG) BY MOUTH THREE TIMES DAILY (Patient not taking: Reported on 08/27/2020) 30 tablet 0  . benazepril (LOTENSIN) 40 MG tablet Take 1 tablet (40 mg total) by mouth daily. 90 tablet 1  . carvedilol (COREG) 25 MG tablet Take 1 tablet (25 mg total) by mouth 2 (two) times daily with a meal. 180 tablet 1  . cyclobenzaprine (FLEXERIL) 10 MG tablet TAKE 1 TABLET(10 MG) BY MOUTH THREE TIMES DAILY AS NEEDED FOR MUSCLE SPASMS 30 tablet 0   . fenofibrate (TRICOR) 145 MG tablet TAKE 1 TABLET(145 MG) BY MOUTH DAILY 90 tablet 1  . fexofenadine (ALLEGRA) 180 MG tablet Take 1 tablet (180 mg total) by mouth daily. 90 tablet 3  . FLUoxetine (PROZAC) 20 MG capsule TAKE 1 CAPSULE(20 MG) BY MOUTH DAILY 90 capsule 0  . fluticasone (FLONASE) 50 MCG/ACT nasal spray 2 sprays in each nostril daily 16 g 3  . HYDROcodone-acetaminophen (NORCO/VICODIN) 5-325 MG tablet Take 1 tablet by mouth every 8 (eight) hours as needed for moderate pain. 15 tablet 0  . hydrOXYzine (ATARAX/VISTARIL) 25 MG tablet TAKE 2 TABLETS BY MOUTH EVERY NIGHT AT BEDTIME. MAY ALSO TAKE 1/2- 1 TABLET 2 TIMES DAILY AS NEEDED FOR ANXIETY 120 tablet 2  . isosorbide mononitrate (IMDUR) 120 MG 24 hr tablet Take 1 tablet (120 mg total) by mouth daily. 90 tablet 1  . nitroGLYCERIN (NITROSTAT) 0.4 MG SL tablet Place 1 tablet (0.4 mg total) under the tongue every 5 (five) minutes as needed for chest pain (Take up to 3 doses). 50 tablet 3  . pantoprazole (PROTONIX) 20 MG tablet Take 1 tablet (20 mg total) by mouth daily. 90 tablet 1  . predniSONE (DELTASONE) 10 MG tablet Take 3 tabs on days 1-3, take 2 tabs on days 4-6, take 1 tab on days 7-9  18 tablet 0   No current facility-administered medications for this visit.    Allergies  Allergen Reactions  . Buprenorphine Hcl     Other reaction(s): Other (See Comments), Other (See Comments)  . Morphine And Related   . Sulfa Antibiotics Rash    Family History  Problem Relation Age of Onset  . COPD Mother   . Hypertension Mother   . Hyperlipidemia Mother   . Depression Mother   . Heart disease Mother   . COPD Father   . Diabetes Father   . Heart disease Father   . Stroke Father   . Cancer Father        pelvic mass  . Lung cancer Father   . Depression Father   . Diabetes Paternal Grandmother     Social History   Socioeconomic History  . Marital status: Single    Spouse name: Not on file  . Number of children: Not on file   . Years of education: Not on file  . Highest education level: Not on file  Occupational History  . Not on file  Tobacco Use  . Smoking status: Former Smoker    Packs/day: 0.00    Years: 25.00    Pack years: 0.00    Types: Cigarettes    Quit date: 04/13/2018    Years since quitting: 2.4  . Smokeless tobacco: Never Used  . Tobacco comment: Has quit off and on about 5-6 times (09/2017)  Vaping Use  . Vaping Use: Never used  Substance and Sexual Activity  . Alcohol use: No  . Drug use: No  . Sexual activity: Not Currently  Other Topics Concern  . Not on file  Social History Narrative  . Not on file   Social Determinants of Health   Financial Resource Strain: Not on file  Food Insecurity: Not on file  Transportation Needs: Not on file  Physical Activity: Not on file  Stress: Not on file  Social Connections: Not on file  Intimate Partner Violence: Not on file     Constitutional: Denies fever, malaise, fatigue, headache or abrupt weight changes.  Respiratory: Denies difficulty breathing, shortness of breath, cough or sputum production.   Cardiovascular: Denies chest pain, chest tightness, palpitations or swelling in the hands or feet.  Gastrointestinal: Denies loss of bowel control..  GU: Denies loss of bladder control.. Musculoskeletal: Pt reports low back pain, difficulty with gait.. Denies decrease in range of motion, muscle pain or joint swelling.  Skin: Denies redness, rashes, lesions or ulcercations.  Neurological: Patient reports numbness and tingling of his right lower extremity.  .    No other specific complaints in a complete review of systems (except as listed in HPI above).  Objective:   Physical Exam  BP 117/62 (BP Location: Right Arm, Patient Position: Sitting, Cuff Size: Normal)   Pulse 72   Temp 98.6 F (37 C) (Temporal)   Resp 18   Ht 5\' 4"  (1.626 m)   Wt 86.8 kg   SpO2 96%   BMI 32.85 kg/m   Wt Readings from Last 3 Encounters:  08/27/20 190  lb 12.8 oz (86.5 kg)  08/12/20 193 lb 6.4 oz (87.7 kg)  05/07/20 203 lb (92.1 kg)    General: Appears his stated age, appears in pain but in NAD. Skin: Warm, dry and intact. No rashesoted. HEENT: Head: normal shape and size; Eyes: sclera white and EOMs intact;  Cardiovascular: Normal rate. Pulmonary/Chest: Normal effort. Musculoskeletal:  Decreased flexion of the spine  secondary to pain. Normal extension, rotation and lateral bending of the spine. No bony tenderness noted over the spine. Pain with palpation over the right SI joint. Pain with abduction and internal rotation of the right hip. Normal flexion, extension, adduction and external rotation of the right hip. Pain with palpation over the right trochanter. Strength 4/5 RLE, 5/5 LLE. Using cane for assistance with gait. Able to stand on tip toes. Having trouble with standing on heels today. Neurological: Alert and oriented. Positive SLR on the right.    BMET    Component Value Date/Time   NA 139 05/07/2020 1612   NA 138 12/10/2016 1902   NA 140 02/03/2012 0313   K 3.1 (L) 05/07/2020 1612   K 3.5 02/03/2012 0313   CL 107 05/07/2020 1612   CL 106 02/03/2012 0313   CO2 24 05/07/2020 1612   CO2 22 02/03/2012 0313   GLUCOSE 98 05/07/2020 1612   GLUCOSE 151 (H) 02/03/2012 0313   BUN 15 05/07/2020 1612   BUN 15 12/10/2016 1902   BUN 9 02/03/2012 0313   CREATININE 1.00 05/07/2020 1612   CREATININE 1.28 08/17/2019 0948   CALCIUM 8.9 05/07/2020 1612   CALCIUM 9.2 02/03/2012 0313   GFRNONAA >60 05/07/2020 1612   GFRNONAA 66 08/17/2019 0948   GFRAA 77 08/17/2019 0948    Lipid Panel     Component Value Date/Time   CHOL 170 08/17/2019 0948   CHOL 139 12/10/2016 1902   TRIG 291 (H) 08/17/2019 0948   HDL 34 (L) 08/17/2019 0948   HDL 22 (L) 12/10/2016 1902   CHOLHDL 5.0 (H) 08/17/2019 0948   LDLCALC 94 08/17/2019 0948    CBC    Component Value Date/Time   WBC 14.9 (H) 05/07/2020 1612   RBC 5.60 05/07/2020 1612   HGB 15.9  05/07/2020 1612   HGB 14.9 04/02/2016 1939   HCT 47.1 05/07/2020 1612   HCT 43.6 04/02/2016 1939   PLT 344 05/07/2020 1612   PLT 268 04/02/2016 1939   MCV 84.1 05/07/2020 1612   MCV 86 04/02/2016 1939   MCV 87 02/03/2012 0313   MCH 28.4 05/07/2020 1612   MCHC 33.8 05/07/2020 1612   RDW 13.3 05/07/2020 1612   RDW 13.3 04/02/2016 1939   RDW 13.0 02/03/2012 0313   LYMPHSABS 3,717 08/17/2019 0948   LYMPHSABS 3.3 (H) 04/02/2016 1939   EOSABS 351 08/17/2019 0948   EOSABS 0.3 04/02/2016 1939   BASOSABS 99 08/17/2019 0948   BASOSABS 0.1 04/02/2016 1939    Hgb A1C Lab Results  Component Value Date   HGBA1C 5.3 02/05/2020           Assessment & Plan:   Chronic Low Back Pain with Right-Sided Sciatica:  X-ray lumbar spine reviewed Referral to PT placed MRI lumbar spine ordered Referral to neurosurgery placed Encouraged ice and stretching Continue Tylenol, Flexeril Hydrocodone refilled today but he is aware that this will be the last refill  Return precautions discussed  Nicki Reaper, NP This visit occurred during the SARS-CoV-2 public health emergency.  Safety protocols were in place, including screening questions prior to the visit, additional usage of staff PPE, and extensive cleaning of exam room while observing appropriate contact time as indicated for disinfecting solutions.

## 2020-09-20 ENCOUNTER — Other Ambulatory Visit: Payer: Self-pay | Admitting: Internal Medicine

## 2020-09-20 DIAGNOSIS — M5441 Lumbago with sciatica, right side: Secondary | ICD-10-CM

## 2020-09-20 MED ORDER — FLUOXETINE HCL 20 MG PO CAPS
ORAL_CAPSULE | ORAL | 0 refills | Status: DC
Start: 2020-09-20 — End: 2021-01-27

## 2020-09-20 MED ORDER — HYDROXYZINE HCL 25 MG PO TABS: ORAL_TABLET | ORAL | 1 refills | Status: DC

## 2020-09-20 NOTE — Telephone Encounter (Signed)
   Notes to clinic:  this refill cannot be delegated  Patient is scheduled for appt on 02/11/2021   Requested Prescriptions  Pending Prescriptions Disp Refills   cyclobenzaprine (FLEXERIL) 10 MG tablet [Pharmacy Med Name: CYCLOBENZAPRINE 10MG  TABLETS] 15 tablet     Sig: TAKE 1 TABLET(10 MG) BY MOUTH THREE TIMES DAILY AS NEEDED FOR MUSCLE SPASMS      Not Delegated - Analgesics:  Muscle Relaxants Failed - 09/20/2020  1:12 PM      Failed - This refill cannot be delegated      Passed - Valid encounter within last 6 months    Recent Outpatient Visits           Yesterday Chronic midline low back pain with right-sided sciatica   [facility] [place], Mullins, NP   3 weeks ago Acute midline low back pain with right-sided sciatica   [facility] [place], Mullins, NP   1 month ago Coronary artery disease involving native coronary artery of native heart without angina pectoris   [facility] [place], Mullins W, NP   4 months ago Acute left-sided low back pain with left-sided sciatica   [facility], [facility], FNP   7 months ago Diabetes mellitus without complication [facility])   [facility], [facility], FNP       Future Appointments             In 4 months [name], Jodelle Gross, NP [facility], [facility]

## 2020-09-22 NOTE — Patient Instructions (Signed)
Sciatica  Sciatica is pain, weakness, tingling, or loss of feeling (numbness) along the sciatic nerve. The sciatic nerve starts in the lower back and goes down the back of each leg. Sciatica usually goes away on its own or with treatment. Sometimes, sciatica may come back (recur). What are the causes? This condition happens when the sciatic nerve is pinched or has pressure put on it. This may be the result of:  A disk in between the bones of the spine bulging out too far (herniated disk).  Changes in the spinal disks that occur with aging.  A condition that affects a muscle in the butt.  Extra bone growth near the sciatic nerve.  A break (fracture) of the area between your hip bones (pelvis).  Pregnancy.  Tumor. This is rare. What increases the risk? You are more likely to develop this condition if you:  Play sports that put pressure or stress on the spine.  Have poor strength and ease of movement (flexibility).  Have had a back injury in the past.  Have had back surgery.  Sit for long periods of time.  Do activities that involve bending or lifting over and over again.  Are very overweight (obese). What are the signs or symptoms? Symptoms can vary from mild to very bad. They may include:  Any of these problems in the lower back, leg, hip, or butt: ? Mild tingling, loss of feeling, or dull aches. ? Burning sensations. ? Sharp pains.  Loss of feeling in the back of the calf or the sole of the foot.  Leg weakness.  Very bad back pain that makes it hard to move. These symptoms may get worse when you cough, sneeze, or laugh. They may also get worse when you sit or stand for long periods of time. How is this treated? This condition often gets better without any treatment. However, treatment may include:  Changing or cutting back on physical activity when you have pain.  Doing exercises and stretching.  Putting ice or heat on the affected area.  Medicines that  help: ? To relieve pain and swelling. ? To relax your muscles.  Shots (injections) of medicines that help to relieve pain, irritation, and swelling.  Surgery. Follow these instructions at home: Medicines  Take over-the-counter and prescription medicines only as told by your doctor.  Ask your doctor if the medicine prescribed to you: ? Requires you to avoid driving or using heavy machinery. ? Can cause trouble pooping (constipation). You may need to take these steps to prevent or treat trouble pooping:  Drink enough fluids to keep your pee (urine) pale yellow.  Take over-the-counter or prescription medicines.  Eat foods that are high in fiber. These include beans, whole grains, and fresh fruits and vegetables.  Limit foods that are high in fat and sugar. These include fried or sweet foods. Managing pain  If told, put ice on the affected area. ? Put ice in a plastic bag. ? Place a towel between your skin and the bag. ? Leave the ice on for 20 minutes, 2-3 times a day.  If told, put heat on the affected area. Use the heat source that your doctor tells you to use, such as a moist heat pack or a heating pad. ? Place a towel between your skin and the heat source. ? Leave the heat on for 20-30 minutes. ? Remove the heat if your skin turns bright red. This is very important if you are unable to feel pain,  heat, or cold. You may have a greater risk of getting burned.      Activity  Return to your normal activities as told by your doctor. Ask your doctor what activities are safe for you.  Avoid activities that make your symptoms worse.  Take short rests during the day. ? When you rest for a long time, do some physical activity or stretching between periods of rest. ? Avoid sitting for a long time without moving. Get up and move around at least one time each hour.  Exercise and stretch regularly, as told by your doctor.  Do not lift anything that is heavier than 10 lb (4.5 kg)  while you have symptoms of sciatica. ? Avoid lifting heavy things even when you do not have symptoms. ? Avoid lifting heavy things over and over.  When you lift objects, always lift in a way that is safe for your body. To do this, you should: ? Bend your knees. ? Keep the object close to your body. ? Avoid twisting.   General instructions  Stay at a healthy weight.  Wear comfortable shoes that support your feet. Avoid wearing high heels.  Avoid sleeping on a mattress that is too soft or too hard. You might have less pain if you sleep on a mattress that is firm enough to support your back.  Keep all follow-up visits as told by your doctor. This is important. Contact a doctor if:  You have pain that: ? Wakes you up when you are sleeping. ? Gets worse when you lie down. ? Is worse than the pain you have had in the past. ? Lasts longer than 4 weeks.  You lose weight without trying. Get help right away if:  You cannot control when you pee (urinate) or poop (have a bowel movement).  You have weakness in any of these areas and it gets worse: ? Lower back. ? The area between your hip bones. ? Butt. ? Legs.  You have redness or swelling of your back.  You have a burning feeling when you pee. Summary  Sciatica is pain, weakness, tingling, or loss of feeling (numbness) along the sciatic nerve.  This condition happens when the sciatic nerve is pinched or has pressure put on it.  Sciatica can cause pain, tingling, or loss of feeling (numbness) in the lower back, legs, hips, and butt.  Treatment often includes rest, exercise, medicines, and putting ice or heat on the affected area. This information is not intended to replace advice given to you by your health care provider. Make sure you discuss any questions you have with your health care provider. Document Revised: 04/25/2018 Document Reviewed: 04/25/2018 Elsevier Patient Education  [place].

## 2020-09-25 ENCOUNTER — Other Ambulatory Visit: Payer: Self-pay | Admitting: Internal Medicine

## 2020-09-25 DIAGNOSIS — M5441 Lumbago with sciatica, right side: Secondary | ICD-10-CM

## 2020-09-25 NOTE — Telephone Encounter (Signed)
Requested medications are due for refill today.  yes  Requested medications are on the active medications list.  yes  Last refill. 09/20/2020  Future visit scheduled.   yes  Notes to clinic. Medication not delegated.

## 2020-09-26 ENCOUNTER — Encounter: Payer: Self-pay | Admitting: Internal Medicine

## 2020-09-26 ENCOUNTER — Other Ambulatory Visit: Payer: Self-pay | Admitting: Internal Medicine

## 2020-09-26 NOTE — Telephone Encounter (Signed)
Requested medication (s) are due for refill today: yes  Requested medication (s) are on the active medication list: yes  Last refill:08/30/2020  Future visit scheduled: yes   Notes to clinic: this refill cannot be delegated    Requested Prescriptions  Pending Prescriptions Disp Refills   traMADol (ULTRAM) 50 MG tablet [Pharmacy Med Name: TRAMADOL 50MG  TABLETS] 15 tablet     Sig: TAKE 1 TABLET(50 MG) BY MOUTH EVERY 8 HOURS FOR UP TO 5 DAYS AS NEEDED      Not Delegated - Analgesics:  Opioid Agonists Failed - 09/26/2020 11:53 AM      Failed - This refill cannot be delegated      Failed - Urine Drug Screen completed in last 360 days      Passed - Valid encounter within last 6 months    Recent Outpatient Visits           1 week ago Chronic midline low back pain with right-sided sciatica   [facility] [place], Mullins, NP   1 month ago Acute midline low back pain with right-sided sciatica   [facility] [place], Mullins, NP   1 month ago Coronary artery disease involving native coronary artery of native heart without angina pectoris   [facility] [place], Mullins W, NP   4 months ago Acute left-sided low back pain with left-sided sciatica   [facility], [facility], FNP   7 months ago Diabetes mellitus without complication [facility])   [facility], [facility], FNP       Future Appointments             In 4 months [name], Jodelle Gross, NP [facility], [facility]

## 2020-10-06 ENCOUNTER — Other Ambulatory Visit: Payer: Self-pay | Admitting: Internal Medicine

## 2020-10-06 DIAGNOSIS — M5441 Lumbago with sciatica, right side: Secondary | ICD-10-CM

## 2020-10-09 ENCOUNTER — Other Ambulatory Visit: Payer: Self-pay

## 2020-10-09 DIAGNOSIS — I25118 Atherosclerotic heart disease of native coronary artery with other forms of angina pectoris: Secondary | ICD-10-CM

## 2020-10-10 MED ORDER — NITROGLYCERIN 0.4 MG SL SUBL: 0.4000 mg | SUBLINGUAL_TABLET | SUBLINGUAL | 0 refills | Status: DC | PRN

## 2020-10-16 ENCOUNTER — Encounter: Payer: Self-pay | Admitting: Internal Medicine

## 2020-11-08 DIAGNOSIS — M47816 Spondylosis without myelopathy or radiculopathy, lumbar region: Secondary | ICD-10-CM | POA: Diagnosis not present

## 2020-11-08 DIAGNOSIS — M5416 Radiculopathy, lumbar region: Secondary | ICD-10-CM | POA: Diagnosis not present

## 2020-11-08 DIAGNOSIS — M5136 Other intervertebral disc degeneration, lumbar region: Secondary | ICD-10-CM | POA: Diagnosis not present

## 2020-11-12 ENCOUNTER — Other Ambulatory Visit: Payer: Self-pay | Admitting: Neurosurgery

## 2020-11-12 ENCOUNTER — Other Ambulatory Visit (HOSPITAL_COMMUNITY): Payer: Self-pay | Admitting: Neurosurgery

## 2020-11-12 DIAGNOSIS — M5416 Radiculopathy, lumbar region: Secondary | ICD-10-CM

## 2020-11-20 ENCOUNTER — Other Ambulatory Visit: Payer: Self-pay | Admitting: Internal Medicine

## 2020-11-20 DIAGNOSIS — E119 Type 2 diabetes mellitus without complications: Secondary | ICD-10-CM

## 2020-11-21 ENCOUNTER — Other Ambulatory Visit: Payer: Self-pay

## 2020-11-21 ENCOUNTER — Ambulatory Visit
Admission: RE | Admit: 2020-11-21 | Discharge: 2020-11-21 | Disposition: A | Discharge status: Home / Self Care | Payer: Medicaid Other | Source: Ambulatory Visit | Attending: Neurosurgery | Admitting: Neurosurgery

## 2020-11-21 DIAGNOSIS — M48061 Spinal stenosis, lumbar region without neurogenic claudication: Secondary | ICD-10-CM | POA: Diagnosis not present

## 2020-11-21 DIAGNOSIS — M5416 Radiculopathy, lumbar region: Secondary | ICD-10-CM | POA: Diagnosis not present

## 2020-11-21 DIAGNOSIS — M5126 Other intervertebral disc displacement, lumbar region: Secondary | ICD-10-CM | POA: Diagnosis not present

## 2020-12-06 DIAGNOSIS — M5441 Lumbago with sciatica, right side: Secondary | ICD-10-CM | POA: Diagnosis not present

## 2020-12-12 DIAGNOSIS — M5136 Other intervertebral disc degeneration, lumbar region: Secondary | ICD-10-CM | POA: Diagnosis not present

## 2020-12-12 DIAGNOSIS — M5416 Radiculopathy, lumbar region: Secondary | ICD-10-CM | POA: Diagnosis not present

## 2020-12-27 DIAGNOSIS — M48062 Spinal stenosis, lumbar region with neurogenic claudication: Secondary | ICD-10-CM | POA: Diagnosis not present

## 2020-12-27 DIAGNOSIS — M5416 Radiculopathy, lumbar region: Secondary | ICD-10-CM | POA: Diagnosis not present

## 2021-01-01 DIAGNOSIS — M5441 Lumbago with sciatica, right side: Secondary | ICD-10-CM | POA: Diagnosis not present

## 2021-01-19 ENCOUNTER — Other Ambulatory Visit: Payer: Self-pay | Admitting: Internal Medicine

## 2021-01-19 DIAGNOSIS — K219 Gastro-esophageal reflux disease without esophagitis: Secondary | ICD-10-CM

## 2021-01-24 DIAGNOSIS — M5136 Other intervertebral disc degeneration, lumbar region: Secondary | ICD-10-CM | POA: Diagnosis not present

## 2021-01-24 DIAGNOSIS — M48062 Spinal stenosis, lumbar region with neurogenic claudication: Secondary | ICD-10-CM | POA: Diagnosis not present

## 2021-01-24 DIAGNOSIS — M5416 Radiculopathy, lumbar region: Secondary | ICD-10-CM | POA: Diagnosis not present

## 2021-01-27 ENCOUNTER — Other Ambulatory Visit: Payer: Self-pay | Admitting: Internal Medicine

## 2021-01-27 DIAGNOSIS — F4010 Social phobia, unspecified: Secondary | ICD-10-CM

## 2021-01-27 NOTE — Telephone Encounter (Signed)
Requested Prescriptions  Pending Prescriptions Disp Refills  . FLUoxetine (PROZAC) 20 MG capsule [Pharmacy Med Name: FLUOXETINE 20MG  CAPSULES] 90 capsule 0    Sig: TAKE 1 CAPSULE(20 MG) BY MOUTH DAILY     Psychiatry:  Antidepressants - SSRI Passed - 01/27/2021  3:08 AM      Passed - Valid encounter within last 6 months    Recent Outpatient Visits          4 months ago Chronic midline low back pain with right-sided sciatica   [facility] [place], Mullins, NP   5 months ago Acute midline low back pain with right-sided sciatica   [facility] [place], Mullins, NP   5 months ago Coronary artery disease involving native coronary artery of native heart without angina pectoris   [facility] [place], Mullins W, NP   9 months ago Acute left-sided low back pain with left-sided sciatica   [facility], [facility], FNP   11 months ago Diabetes mellitus without complication [facility])   [facility], [facility], FNP      Future Appointments            In 2 weeks [name], Jodelle Gross, NP [facility], [facility]

## 2021-02-11 ENCOUNTER — Ambulatory Visit (INDEPENDENT_AMBULATORY_CARE_PROVIDER_SITE_OTHER): Payer: Medicaid Other | Admitting: Internal Medicine

## 2021-02-11 ENCOUNTER — Encounter: Payer: Self-pay | Admitting: Internal Medicine

## 2021-02-11 ENCOUNTER — Other Ambulatory Visit: Payer: Self-pay

## 2021-02-11 VITALS — BP 137/80 | HR 60 | Temp 97.8°F | Resp 18 | Ht 64.0 in | Wt 191.2 lb

## 2021-02-11 DIAGNOSIS — F39 Unspecified mood [affective] disorder: Secondary | ICD-10-CM | POA: Diagnosis not present

## 2021-02-11 DIAGNOSIS — Z23 Encounter for immunization: Secondary | ICD-10-CM

## 2021-02-11 DIAGNOSIS — Z1159 Encounter for screening for other viral diseases: Secondary | ICD-10-CM

## 2021-02-11 DIAGNOSIS — Z1211 Encounter for screening for malignant neoplasm of colon: Secondary | ICD-10-CM

## 2021-02-11 DIAGNOSIS — Z0001 Encounter for general adult medical examination with abnormal findings: Secondary | ICD-10-CM

## 2021-02-11 DIAGNOSIS — Z114 Encounter for screening for human immunodeficiency virus [HIV]: Secondary | ICD-10-CM | POA: Diagnosis not present

## 2021-02-11 MED ORDER — FLUOXETINE HCL 40 MG PO CAPS: 40.0000 mg | ORAL_CAPSULE | Freq: Every day | ORAL | 3 refills | Status: DC

## 2021-02-11 NOTE — Progress Notes (Signed)
Subjective:    Patient ID: Calvin Ortiz, male    DOB: 07-29-1970, 50 y.o.   MRN: [id number]  HPI  Pt presents to the clinic today for his annual exam.  Flu: 01/2020 Tetanus: 01/2019 Pneumovax: 07/2020 Covid: Moderna x 2 Colon Screening: never Vision Screening: annually Dentist: as needed  Diet: He does eat meat. He consumes some fruits and veggies. He tries to avoid fried foods. He drinks mostly sweet tea and water. Exercise: PT exercise  Review of Systems     Past Medical History:  Diagnosis Date   Anxiety    Depression    Diabetes mellitus without complication (HCC)    GERD (gastroesophageal reflux disease)    Hyperlipidemia    Hypertension    Migraines    Myocardial infarction (HCC)    3 Stents   Sleep apnea    Stroke [facility])     Current Outpatient Medications  Medication Sig Dispense Refill   acetaminophen (TYLENOL) 500 MG tablet Take 1,000 mg by mouth every 6 (six) hours as needed.     amLODipine (NORVASC) 10 MG tablet TAKE 1 TABLET(10 MG) BY MOUTH DAILY 90 tablet 1   aspirin EC 81 MG tablet Take 81 mg by mouth daily.     atorvastatin (LIPITOR) 40 MG tablet TAKE 1 TABLET(40 MG) BY MOUTH DAILY 90 tablet 2   benazepril (LOTENSIN) 40 MG tablet Take 1 tablet (40 mg total) by mouth daily. 90 tablet 1   carvedilol (COREG) 25 MG tablet Take 1 tablet (25 mg total) by mouth 2 (two) times daily with a meal. 180 tablet 1   cyclobenzaprine (FLEXERIL) 10 MG tablet TAKE 1 TABLET(10 MG) BY MOUTH THREE TIMES DAILY AS NEEDED FOR MUSCLE SPASMS 30 tablet 0   fenofibrate (TRICOR) 145 MG tablet TAKE 1 TABLET(145 MG) BY MOUTH DAILY 90 tablet 1   fexofenadine (ALLEGRA) 180 MG tablet Take 1 tablet (180 mg total) by mouth daily. 90 tablet 3   FLUoxetine (PROZAC) 20 MG capsule TAKE 1 CAPSULE(20 MG) BY MOUTH DAILY 90 capsule 0   fluticasone (FLONASE) 50 MCG/ACT nasal spray 2 sprays in each nostril daily 16 g 3   hydrOXYzine (ATARAX/VISTARIL) 25 MG tablet TAKE 2 TABLETS BY MOUTH  EVERY NIGHT AT BEDTIME. MAY ALSO TAKE 1/2- 1 TABLET 2 TIMES DAILY AS NEEDED FOR ANXIETY 120 tablet 1   isosorbide mononitrate (IMDUR) 120 MG 24 hr tablet Take 1 tablet (120 mg total) by mouth daily. 90 tablet 1   nitroGLYCERIN (NITROSTAT) 0.4 MG SL tablet Place 1 tablet (0.4 mg total) under the tongue every 5 (five) minutes as needed for chest pain (Take up to 3 doses). 30 tablet 0   pantoprazole (PROTONIX) 20 MG tablet TAKE 1 TABLET(20 MG) BY MOUTH DAILY 90 tablet 1   pseudoephedrine-guaifenesin (MUCINEX D) 60-600 MG 12 hr tablet Take 1 tablet by mouth every 12 (twelve) hours.     No current facility-administered medications for this visit.    Allergies  Allergen Reactions   Buprenorphine Hcl     Other reaction(s): Other (See Comments), Other (See Comments)   Morphine And Related    Sulfa Antibiotics Rash    Family History  Problem Relation Age of Onset   COPD Mother    Hypertension Mother    Hyperlipidemia Mother    Depression Mother    Heart disease Mother    COPD Father    Diabetes Father    Heart disease Father    Stroke Father  Cancer Father        pelvic mass   Lung cancer Father    Depression Father    Diabetes Paternal Grandmother     Social History   Socioeconomic History   Marital status: Single    Spouse name: Not on file   Number of children: Not on file   Years of education: Not on file   Highest education level: Not on file  Occupational History   Not on file  Tobacco Use   Smoking status: Former    Packs/day: 0.00    Years: 25.00    Pack years: 0.00    Types: Cigarettes    Quit date: 04/13/2018    Years since quitting: 2.8   Smokeless tobacco: Never   Tobacco comments:    Has quit off and on about 5-6 times (09/2017)  Vaping Use   Vaping Use: Never used  Substance and Sexual Activity   Alcohol use: No   Drug use: No   Sexual activity: Not Currently  Other Topics Concern   Not on file  Social History Narrative   Not on file   Social  Determinants of Health   Financial Resource Strain: Not on file  Food Insecurity: Not on file  Transportation Needs: Not on file  Physical Activity: Not on file  Stress: Not on file  Social Connections: Not on file  Intimate Partner Violence: Not on file     Constitutional: Denies fever, malaise, fatigue, headache or abrupt weight changes.  HEENT: Denies eye pain, eye redness, ear pain, ringing in the ears, wax buildup, runny nose, nasal congestion, bloody nose, or sore throat. Respiratory: Denies difficulty breathing, shortness of breath, cough or sputum production.   Cardiovascular: Denies chest pain, chest tightness, palpitations or swelling in the hands or feet.  Gastrointestinal: Denies abdominal pain, bloating, constipation, diarrhea or blood in the stool.  GU: Pt reports urinary frequency. Denies urgency, pain with urination, burning sensation, blood in urine, odor or discharge. Musculoskeletal: Pt reports chronic low back pain. Denies decrease in range of motion, difficulty with gait, or joint swelling.  Skin: Denies redness, rashes, lesions or ulcercations.  Neurological: Denies dizziness, difficulty with memory, difficulty with speech or problems with balance and coordination.  Psych: Pt has a history of anxiety and depression. Denies SI/HI.  No other specific complaints in a complete review of systems (except as listed in HPI above).  Objective:   Physical Exam  BP 137/80 (BP Location: Right Arm, Patient Position: Sitting, Cuff Size: Normal)   Pulse 60   Temp 97.8 F (36.6 C) (Temporal)   Resp 18   Ht 5\' 4"  (1.626 m)   Wt 191 lb 3.2 oz (86.7 kg)   SpO2 100%   BMI 32.82 kg/m   Wt Readings from Last 3 Encounters:  09/19/20 191 lb 6.4 oz (86.8 kg)  08/27/20 190 lb 12.8 oz (86.5 kg)  08/12/20 193 lb 6.4 oz (87.7 kg)    General: Appears his stated age, obese,  in NAD. Skin: Warm, dry and intact. No ulcerations noted. HEENT: Head: normal shape and size; Eyes: EOMs  intact;  Neck:  Neck supple, trachea midline. No masses, lumps or thyromegaly present.  Cardiovascular: Normal rate and rhythm. S1,S2 noted.  No murmur, rubs or gallops noted. No JVD or BLE edema. Varicose veins noted of BLE. Pulmonary/Chest: Normal effort and positive vesicular breath sounds. No respiratory distress. No wheezes, rales or ronchi noted.  Abdomen: Soft and nontender. Normal bowel sounds. No distention  or masses noted. Liver, spleen and kidneys non palpable. Musculoskeletal: Strength5/5 BUE/BLE. No difficulty with gait.  Neurological: Alert and oriented. Cranial nerves II-XII grossly intact. Coordination normal.  Psychiatric: Mood and affect normal. Mildly anxious appearing. Judgment and thought content normal.     BMET    Component Value Date/Time   NA 139 05/07/2020 1612   NA 138 12/10/2016 1902   NA 140 02/03/2012 0313   K 3.1 (L) 05/07/2020 1612   K 3.5 02/03/2012 0313   CL 107 05/07/2020 1612   CL 106 02/03/2012 0313   CO2 24 05/07/2020 1612   CO2 22 02/03/2012 0313   GLUCOSE 98 05/07/2020 1612   GLUCOSE 151 (H) 02/03/2012 0313   BUN 15 05/07/2020 1612   BUN 15 12/10/2016 1902   BUN 9 02/03/2012 0313   CREATININE 1.00 05/07/2020 1612   CREATININE 1.28 08/17/2019 0948   CALCIUM 8.9 05/07/2020 1612   CALCIUM 9.2 02/03/2012 0313   GFRNONAA >60 05/07/2020 1612   GFRNONAA 66 08/17/2019 0948   GFRAA 77 08/17/2019 0948    Lipid Panel     Component Value Date/Time   CHOL 170 08/17/2019 0948   CHOL 139 12/10/2016 1902   TRIG 291 (H) 08/17/2019 0948   HDL 34 (L) 08/17/2019 0948   HDL 22 (L) 12/10/2016 1902   CHOLHDL 5.0 (H) 08/17/2019 0948   LDLCALC 94 08/17/2019 0948    CBC    Component Value Date/Time   WBC 14.9 (H) 05/07/2020 1612   RBC 5.60 05/07/2020 1612   HGB 15.9 05/07/2020 1612   HGB 14.9 04/02/2016 1939   HCT 47.1 05/07/2020 1612   HCT 43.6 04/02/2016 1939   PLT 344 05/07/2020 1612   PLT 268 04/02/2016 1939   MCV 84.1 05/07/2020 1612    MCV 86 04/02/2016 1939   MCV 87 02/03/2012 0313   MCH 28.4 05/07/2020 1612   MCHC 33.8 05/07/2020 1612   RDW 13.3 05/07/2020 1612   RDW 13.3 04/02/2016 1939   RDW 13.0 02/03/2012 0313   LYMPHSABS 3,717 08/17/2019 0948   LYMPHSABS 3.3 (H) 04/02/2016 1939   EOSABS 351 08/17/2019 0948   EOSABS 0.3 04/02/2016 1939   BASOSABS 99 08/17/2019 0948   BASOSABS 0.1 04/02/2016 1939    Hgb A1C Lab Results  Component Value Date   HGBA1C 5.3 02/05/2020            Assessment & Plan:   Preventative Health Maintenance:  Flu shot today Tetanus UTD Encouraged him to get his covid booster Referral to GI for screening colonoscopy Encouraged him to consumea balanced diet and exercise regimen Advised him to see an eye doctor and dentist annually Will check CBC, CMET, Lipid, A1C, HIV and Hep C today  RTC in 6 months, follow up chronic condtions  Nicki Reaper, NP This visit occurred during the SARS-CoV-2 public health emergency.  Safety protocols were in place, including screening questions prior to the visit, additional usage of staff PPE, and extensive cleaning of exam room while observing appropriate contact time as indicated for disinfecting solutions.

## 2021-02-11 NOTE — Assessment & Plan Note (Signed)
Deteriorated Will increase Fluoxetine to 40 mg daily Continue Hydroxyzine as needed Support offered

## 2021-02-11 NOTE — Patient Instructions (Signed)
Health Maintenance, Male Adopting a healthy lifestyle and getting preventive care are important in promoting health and wellness. Ask your health care provider about: The right schedule for you to have regular tests and exams. Things you can do on your own to prevent diseases and keep yourself healthy. What should I know about diet, weight, and exercise? Eat a healthy diet  Eat a diet that includes plenty of vegetables, fruits, low-fat dairy products, and lean protein. Do not eat a lot of foods that are high in solid fats, added sugars, or sodium. Maintain a healthy weight Body mass index (BMI) is a measurement that can be used to identify possible weight problems. It estimates body fat based on height and weight. Your health care provider can help determine your BMI and help you achieve or maintain a healthy weight. Get regular exercise Get regular exercise. This is one of the most important things you can do for your health. Most adults should: Exercise for at least 150 minutes each week. The exercise should increase your heart rate and make you sweat (moderate-intensity exercise). Do strengthening exercises at least twice a week. This is in addition to the moderate-intensity exercise. Spend less time sitting. Even light physical activity can be beneficial. Watch cholesterol and blood lipids Have your blood tested for lipids and cholesterol at 49 years of age, then have this test every 5 years. You may need to have your cholesterol levels checked more often if: Your lipid or cholesterol levels are high. You are older than 49 years of age. You are at high risk for heart disease. What should I know about cancer screening? Many types of cancers can be detected early and may often be prevented. Depending on your health history and family history, you may need to have cancer screening at various ages. This may include screening for: Colorectal cancer. Prostate cancer. Skin cancer. Lung  cancer. What should I know about heart disease, diabetes, and high blood pressure? Blood pressure and heart disease High blood pressure causes heart disease and increases the risk of stroke. This is more likely to develop in people who have high blood pressure readings, are of African descent, or are overweight. Talk with your health care provider about your target blood pressure readings. Have your blood pressure checked: Every 3-5 years if you are 18-39 years of age. Every year if you are 40 years old or older. If you are between the ages of 65 and 75 and are a current or former smoker, ask your health care provider if you should have a one-time screening for abdominal aortic aneurysm (AAA). Diabetes Have regular diabetes screenings. This checks your fasting blood sugar level. Have the screening done: Once every three years after age 45 if you are at a normal weight and have a low risk for diabetes. More often and at a younger age if you are overweight or have a high risk for diabetes. What should I know about preventing infection? Hepatitis B If you have a higher risk for hepatitis B, you should be screened for this virus. Talk with your health care provider to find out if you are at risk for hepatitis B infection. Hepatitis C Blood testing is recommended for: Everyone born from 1945 through 1965. Anyone with known risk factors for hepatitis C. Sexually transmitted infections (STIs) You should be screened each year for STIs, including gonorrhea and chlamydia, if: You are sexually active and are younger than 49 years of age. You are older than 49 years   of age and your health care provider tells you that you are at risk for this type of infection. Your sexual activity has changed since you were last screened, and you are at increased risk for chlamydia or gonorrhea. Ask your health care provider if you are at risk. Ask your health care provider about whether you are at high risk for HIV.  Your health care provider may recommend a prescription medicine to help prevent HIV infection. If you choose to take medicine to prevent HIV, you should first get tested for HIV. You should then be tested every 3 months for as long as you are taking the medicine. Follow these instructions at home: Lifestyle Do not use any products that contain nicotine or tobacco, such as cigarettes, e-cigarettes, and chewing tobacco. If you need help quitting, ask your health care provider. Do not use street drugs. Do not share needles. Ask your health care provider for help if you need support or information about quitting drugs. Alcohol use Do not drink alcohol if your health care provider tells you not to drink. If you drink alcohol: Limit how much you have to 0-2 drinks a day. Be aware of how much alcohol is in your drink. In the [place] one drink equals one 12 oz bottle of beer (355 mL), one 5 oz glass of wine (148 mL), or one 1 oz glass of hard liquor (44 mL). General instructions Schedule regular health, dental, and eye exams. Stay current with your vaccines. Tell your health care provider if: You often feel depressed. You have ever been abused or do not feel safe at home. Summary Adopting a healthy lifestyle and getting preventive care are important in promoting health and wellness. Follow your health care provider's instructions about healthy diet, exercising, and getting tested or screened for diseases. Follow your health care provider's instructions on monitoring your cholesterol and blood pressure. This information is not intended to replace advice given to you by your health care provider. Make sure you discuss any questions you have with your health care provider. Document Revised: 06/14/2020 Document Reviewed: 03/30/2018 Elsevier Patient Education  2022 Elsevier Inc.  

## 2021-02-12 ENCOUNTER — Telehealth: Payer: Self-pay

## 2021-02-12 ENCOUNTER — Other Ambulatory Visit: Payer: Medicaid Other

## 2021-02-12 NOTE — Telephone Encounter (Signed)
RETURNING YOUR CALL-Patient is ready to schedule procedure. Clinical staff will follow up with patient.

## 2021-02-13 LAB — LIPID PANEL
Cholesterol: 145 mg/dL (ref <200)
HDL: 27 mg/dL — ABNORMAL LOW (ref >=40)
LDL Cholesterol (Calc): 80 mg/dL (calc)
Non-HDL Cholesterol (Calc): 118 mg/dL (calc) (ref <130)
Total CHOL/HDL Ratio: 5.4 (calc) — ABNORMAL HIGH (ref <5.0)
Triglycerides: 286 mg/dL — ABNORMAL HIGH (ref <150)

## 2021-02-13 LAB — COMPLETE METABOLIC PANEL WITH GFR
AG Ratio: 1.7 (calc) (ref 1.0–2.5)
ALT: 22 U/L (ref 9–46)
AST: 19 U/L (ref 10–40)
Albumin: 4.3 g/dL (ref 3.6–5.1)
Alkaline phosphatase (APISO): 82 U/L (ref 36–130)
BUN: 11 mg/dL (ref 7–25)
CO2: 24 mmol/L (ref 20–32)
Calcium: 9.8 mg/dL (ref 8.6–10.3)
Chloride: 105 mmol/L (ref 98–110)
Creat: 0.96 mg/dL (ref 0.60–1.29)
Globulin: 2.6 g/dL (calc) (ref 1.9–3.7)
Glucose, Bld: 102 mg/dL — ABNORMAL HIGH (ref 65–99)
Potassium: 4.4 mmol/L (ref 3.5–5.3)
Sodium: 141 mmol/L (ref 135–146)
Total Bilirubin: 0.4 mg/dL (ref 0.2–1.2)
Total Protein: 6.9 g/dL (ref 6.1–8.1)
eGFR: 97 mL/min/{1.73_m2} (ref >=60)

## 2021-02-13 LAB — CBC
HCT: 52.3 % — ABNORMAL HIGH (ref 38.5–50.0)
Hemoglobin: 17.2 g/dL — ABNORMAL HIGH (ref 13.2–17.1)
MCH: 28.9 pg (ref 27.0–33.0)
MCHC: 32.9 g/dL (ref 32.0–36.0)
MCV: 87.8 fL (ref 80.0–100.0)
MPV: 9.8 fL (ref 7.5–12.5)
Platelets: 254 10*3/uL (ref 140–400)
RBC: 5.96 10*6/uL — ABNORMAL HIGH (ref 4.20–5.80)
RDW: 12.8 % (ref 11.0–15.0)
WBC: 9.9 10*3/uL (ref 3.8–10.8)

## 2021-02-13 LAB — HEMOGLOBIN A1C
Hgb A1c MFr Bld: 5.4 % of total Hgb (ref <5.7)
Mean Plasma Glucose: 108 mg/dL
eAG (mmol/L): 6 mmol/L

## 2021-02-13 LAB — HEPATITIS C ANTIBODY
Hepatitis C Ab: NONREACTIVE
SIGNAL TO CUT-OFF: 0.06 (ref <1.00)

## 2021-02-13 LAB — HIV ANTIBODY (ROUTINE TESTING W REFLEX): HIV 1&2 Ab, 4th Generation: NONREACTIVE

## 2021-02-13 NOTE — Telephone Encounter (Signed)
Returned patients call. No answer.

## 2021-02-14 NOTE — Progress Notes (Signed)
A reminder letter has been sent to patient.

## 2021-02-25 ENCOUNTER — Telehealth: Payer: Self-pay

## 2021-02-25 NOTE — Telephone Encounter (Signed)
Patient is ready to schedule procedure. Clinical staff will follow up with patient. °

## 2021-02-26 ENCOUNTER — Other Ambulatory Visit: Payer: Self-pay

## 2021-02-26 DIAGNOSIS — Z1211 Encounter for screening for malignant neoplasm of colon: Secondary | ICD-10-CM

## 2021-02-26 MED ORDER — PEG 3350-KCL-NA BICARB-NACL 420 G PO SOLR: 4000.0000 mL | Freq: Once | ORAL | 0 refills | Status: AC

## 2021-02-26 NOTE — Telephone Encounter (Signed)
Procedure scheduled for 03/10/21.

## 2021-02-26 NOTE — Progress Notes (Signed)
Gastroenterology Pre-Procedure Review  Request Date: 03/10/21 Requesting Physician: Dr. Tobi Bastos  PATIENT REVIEW QUESTIONS: The patient responded to the following health history questions as indicated:    1. Are you having any GI issues? no 2. Do you have a personal history of Polyps? no 3. Do you have a family history of Colon Cancer or Polyps? yes (Parental uncle- colon cancer) 4. Diabetes Mellitus? no 5. Joint replacements in the past 12 months?no 6. Major health problems in the past 3 months?no 7. Any artificial heart valves, MVP, or defibrillator?no    MEDICATIONS & ALLERGIES:    Patient reports the following regarding taking any anticoagulation/antiplatelet therapy:   Plavix, Coumadin, Eliquis, Xarelto, Lovenox, Pradaxa, Brilinta, or Effient? no Aspirin? yes (81 mg)  Patient confirms/reports the following medications:  Current Outpatient Medications  Medication Sig Dispense Refill   acetaminophen (TYLENOL) 500 MG tablet Take 1,000 mg by mouth every 6 (six) hours as needed.     amLODipine (NORVASC) 10 MG tablet TAKE 1 TABLET(10 MG) BY MOUTH DAILY 90 tablet 1   aspirin EC 81 MG tablet Take 81 mg by mouth daily.     atorvastatin (LIPITOR) 40 MG tablet TAKE 1 TABLET(40 MG) BY MOUTH DAILY 90 tablet 2   benazepril (LOTENSIN) 40 MG tablet Take 1 tablet (40 mg total) by mouth daily. 90 tablet 1   carvedilol (COREG) 25 MG tablet Take 1 tablet (25 mg total) by mouth 2 (two) times daily with a meal. 180 tablet 1   cyclobenzaprine (FLEXERIL) 10 MG tablet TAKE 1 TABLET(10 MG) BY MOUTH THREE TIMES DAILY AS NEEDED FOR MUSCLE SPASMS 30 tablet 0   fenofibrate (TRICOR) 145 MG tablet TAKE 1 TABLET(145 MG) BY MOUTH DAILY 90 tablet 1   fexofenadine (ALLEGRA) 180 MG tablet Take 1 tablet (180 mg total) by mouth daily. 90 tablet 3   FLUoxetine (PROZAC) 40 MG capsule Take 1 capsule (40 mg total) by mouth daily. 90 capsule 3   fluticasone (FLONASE) 50 MCG/ACT nasal spray 2 sprays in each nostril daily 16 g  3   hydrOXYzine (ATARAX/VISTARIL) 25 MG tablet TAKE 2 TABLETS BY MOUTH EVERY NIGHT AT BEDTIME. MAY ALSO TAKE 1/2- 1 TABLET 2 TIMES DAILY AS NEEDED FOR ANXIETY 120 tablet 1   isosorbide mononitrate (IMDUR) 120 MG 24 hr tablet Take 1 tablet (120 mg total) by mouth daily. 90 tablet 1   nitroGLYCERIN (NITROSTAT) 0.4 MG SL tablet Place 1 tablet (0.4 mg total) under the tongue every 5 (five) minutes as needed for chest pain (Take up to 3 doses). 30 tablet 0   pantoprazole (PROTONIX) 20 MG tablet TAKE 1 TABLET(20 MG) BY MOUTH DAILY 90 tablet 1   pseudoephedrine-guaifenesin (MUCINEX D) 60-600 MG 12 hr tablet Take 1 tablet by mouth every 12 (twelve) hours.     No current facility-administered medications for this visit.    Patient confirms/reports the following allergies:  Allergies  Allergen Reactions   Buprenorphine Hcl     Other reaction(s): Other (See Comments), Other (See Comments)   Morphine And Related    Sulfa Antibiotics Rash    No orders of the defined types were placed in this encounter.   AUTHORIZATION INFORMATION Primary Insurance: 1D#: Group #:  Secondary Insurance: 1D#: Group #:  SCHEDULE INFORMATION: Date: 03/10/21 Time: Location: [facility]

## 2021-03-04 DIAGNOSIS — M5416 Radiculopathy, lumbar region: Secondary | ICD-10-CM | POA: Diagnosis not present

## 2021-03-04 DIAGNOSIS — M48062 Spinal stenosis, lumbar region with neurogenic claudication: Secondary | ICD-10-CM | POA: Diagnosis not present

## 2021-03-06 ENCOUNTER — Telehealth: Payer: Self-pay | Admitting: Gastroenterology

## 2021-03-06 NOTE — Telephone Encounter (Signed)
Procedure rescheduled for 03/24/21.

## 2021-03-06 NOTE — Telephone Encounter (Signed)
Pt needs to r/s his procedure

## 2021-03-21 ENCOUNTER — Encounter: Payer: Self-pay | Admitting: Gastroenterology

## 2021-03-24 ENCOUNTER — Encounter: Payer: Self-pay | Admitting: Gastroenterology

## 2021-03-24 ENCOUNTER — Encounter
Admission: RE | Disposition: A | Discharge status: Home / Self Care | Payer: Self-pay | Source: Home / Self Care | Attending: Gastroenterology

## 2021-03-24 ENCOUNTER — Ambulatory Visit
Admission: RE | Admit: 2021-03-24 | Discharge: 2021-03-24 | Disposition: A | Discharge status: Home / Self Care | Payer: Medicaid Other | Attending: Gastroenterology | Admitting: Gastroenterology

## 2021-03-24 ENCOUNTER — Ambulatory Visit: Payer: Medicaid Other | Admitting: Certified Registered"

## 2021-03-24 DIAGNOSIS — F17210 Nicotine dependence, cigarettes, uncomplicated: Secondary | ICD-10-CM | POA: Diagnosis not present

## 2021-03-24 DIAGNOSIS — K219 Gastro-esophageal reflux disease without esophagitis: Secondary | ICD-10-CM | POA: Diagnosis not present

## 2021-03-24 DIAGNOSIS — Z1211 Encounter for screening for malignant neoplasm of colon: Secondary | ICD-10-CM | POA: Insufficient documentation

## 2021-03-24 HISTORY — PX: COLONOSCOPY WITH PROPOFOL: SHX5780

## 2021-03-24 SURGERY — COLONOSCOPY WITH PROPOFOL
Anesthesia: General

## 2021-03-24 MED ORDER — GLYCOPYRROLATE 0.2 MG/ML IJ SOLN
INTRAMUSCULAR | Status: DC | PRN
  Administered 2021-03-24: .2 mg via INTRAVENOUS

## 2021-03-24 MED ORDER — SODIUM CHLORIDE 0.9 % IV SOLN: INTRAVENOUS | Status: DC

## 2021-03-24 MED ORDER — LIDOCAINE 2% (20 MG/ML) 5 ML SYRINGE
INTRAMUSCULAR | Status: DC | PRN
  Administered 2021-03-24: 20 mg via INTRAVENOUS

## 2021-03-24 MED ORDER — PROPOFOL 10 MG/ML IV BOLUS
INTRAVENOUS | Status: DC | PRN
  Administered 2021-03-24 (×2): 20 mg via INTRAVENOUS
  Administered 2021-03-24: 100 mg via INTRAVENOUS

## 2021-03-24 MED ORDER — PROPOFOL 500 MG/50ML IV EMUL
INTRAVENOUS | Status: DC | PRN
  Administered 2021-03-24: 120 ug/kg/min via INTRAVENOUS

## 2021-03-24 NOTE — Transfer of Care (Signed)
Immediate Anesthesia Transfer of Care Note  Patient: Calvin Ortiz  Procedure(s) Performed: COLONOSCOPY WITH PROPOFOL  Patient Location: Endoscopy Unit  Anesthesia Type:General  Level of Consciousness: drowsy  Airway & Oxygen Therapy: Patient Spontanous Breathing  Post-op Assessment: Report given to RN and Post -op Vital signs reviewed and stable  Post vital signs: Reviewed  Last Vitals:  Vitals Value Taken Time  BP 110/76 03/24/21 1103  Temp    Pulse 58 03/24/21 1104  Resp 20 03/24/21 1104  SpO2 96 % 03/24/21 1104    Last Pain:  Vitals:   03/24/21 1103  TempSrc:   PainSc: Asleep         Complications: No notable events documented.

## 2021-03-24 NOTE — Anesthesia Postprocedure Evaluation (Signed)
Anesthesia Post Note  Patient: Calvin Ortiz  Procedure(s) Performed: COLONOSCOPY WITH PROPOFOL  Patient location during evaluation: PACU Anesthesia Type: General Level of consciousness: awake and alert Pain management: pain level controlled Vital Signs Assessment: post-procedure vital signs reviewed and stable Respiratory status: spontaneous breathing, nonlabored ventilation, respiratory function stable and patient connected to nasal cannula oxygen Cardiovascular status: blood pressure returned to baseline and stable Postop Assessment: no apparent nausea or vomiting Anesthetic complications: no   No notable events documented.   Last Vitals:  Vitals:   03/24/21 1113 03/24/21 1123  BP: 122/74 124/79  Pulse: (!) 54 60  Resp: 18 16  Temp:    SpO2: 96% 99%    Last Pain:  Vitals:   03/24/21 1123  TempSrc:   PainSc: 0-No pain                 Yevette Edwards

## 2021-03-24 NOTE — Op Note (Signed)
[facility] Gastroenterology Patient Name: Calvin Ortiz Procedure Date: 03/24/2021 10:33 AM MRN: [id number] Account #: [id number] Date of Birth: 03-Jul-1970 Admit Type: Outpatient Age: 50 Room: [facility] ENDO ROOM 4 Gender: Male Note Status: Finalized Instrument Name: Nelda Marseille [id number] Procedure:             Colonoscopy Indications:           Screening for colorectal malignant neoplasm Providers:             Wyline Mood MD, MD Referring MD:          Lorre Munroe (Referring MD) Medicines:             Monitored Anesthesia Care Complications:         No immediate complications. Procedure:             Pre-Anesthesia Assessment:                        - Prior to the procedure, a History and Physical was                         performed, and patient medications, allergies and                         sensitivities were reviewed. The patient's tolerance                         of previous anesthesia was reviewed.                        - The risks and benefits of the procedure and the                         sedation options and risks were discussed with the                         patient. All questions were answered and informed                         consent was obtained.                        - After reviewing the risks and benefits, the patient                         was deemed in satisfactory condition to undergo the                         procedure.                        - ASA Grade Assessment: II - A patient with mild                         systemic disease.                        After obtaining informed consent, the colonoscope was                         passed under direct vision. Throughout  the procedure,                         the patient's blood pressure, pulse, and oxygen                         saturations were monitored continuously. The                         Colonoscope was introduced through the anus and                         advanced  to the the cecum, identified by the                         appendiceal orifice. The colonoscopy was performed                         with ease. The patient tolerated the procedure well.                         The quality of the bowel preparation was good. Findings:      The perianal and digital rectal examinations were normal.      The entire examined colon appeared normal on direct and retroflexion       views. Impression:            - The entire examined colon is normal on direct and                         retroflexion views.                        - No specimens collected. Recommendation:        - Discharge patient to home (with escort).                        - Resume previous diet.                        - Continue present medications.                        - Repeat colonoscopy in 10 years for screening                         purposes. Procedure Code(s):     --- Professional ---                        [phone], Colonoscopy, flexible; diagnostic, including                         collection of specimen(s) by brushing or washing, when                         performed (separate procedure) Diagnosis Code(s):     --- Professional ---                        Z12.11, Encounter for screening for malignant neoplasm  of colon CPT copyright 2019 [employer]. All rights reserved. The codes documented in this report are preliminary and upon coder review may  be revised to meet current compliance requirements. Wyline Mood, MD Wyline Mood MD, MD 03/24/2021 11:02:52 AM This report has been signed electronically. Number of Addenda: 0 Note Initiated On: 03/24/2021 10:33 AM Scope Withdrawal Time: 0 hours 9 minutes 48 seconds  Total Procedure Duration: 0 hours 14 minutes 29 seconds  Estimated Blood Loss:  Estimated blood loss: none.      [facility]

## 2021-03-24 NOTE — Anesthesia Preprocedure Evaluation (Signed)
Anesthesia Evaluation  Patient identified by MRN, date of birth, ID band Patient awake    Reviewed: Allergy & Precautions, H&P , NPO status , Patient's Chart, lab work & pertinent test results, reviewed documented beta blocker date and time   Airway Mallampati: II   Neck ROM: full    Dental  (+) Poor Dentition   Pulmonary sleep apnea and Continuous Positive Airway Pressure Ventilation , Current Smoker,    Pulmonary exam normal        Cardiovascular Exercise Tolerance: Good On Medications + CAD and + Past MI  Normal cardiovascular exam Rhythm:regular Rate:Normal     Neuro/Psych  Headaches, PSYCHIATRIC DISORDERS Anxiety Depression CVA    GI/Hepatic Neg liver ROS, GERD  Medicated,  Endo/Other  negative endocrine ROSdiabetes, Well Controlled  Renal/GU negative Renal ROS  negative genitourinary   Musculoskeletal   Abdominal   Peds  Hematology negative hematology ROS (+)   Anesthesia Other Findings Past Medical History: No date: Anxiety No date: Depression No date: Diabetes mellitus without complication (HCC) No date: GERD (gastroesophageal reflux disease) No date: Hyperlipidemia No date: Hypertension No date: Migraines No date: Myocardial infarction [facility])     Comment:  3 Stents No date: Sleep apnea No date: Stroke [facility]) Past Surgical History: 2005 and 2007: BACK SURGERY No date: CORONARY ANGIOPLASTY WITH STENT PLACEMENT 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 BMI    Body Mass Index: 31.82 kg/m     Reproductive/Obstetrics negative OB ROS                             Anesthesia Physical Anesthesia Plan  ASA: 3  Anesthesia Plan: General   Post-op Pain Management:    Induction:   PONV Risk Score and Plan:   Airway Management Planned:   Additional Equipment:   Intra-op Plan:   Post-operative Plan:   Informed Consent: I have reviewed the patients History and Physical, chart,  labs and discussed the procedure including the risks, benefits and alternatives for the proposed anesthesia with the patient or authorized representative who has indicated his/her understanding and acceptance.     Dental Advisory Given  Plan Discussed with: CRNA  Anesthesia Plan Comments:         Anesthesia Quick Evaluation

## 2021-03-24 NOTE — H&P (Signed)
Calvin Bellows, MD [address] [place], [place], [place], [address] [address], [place], [place], [place], [address] Phone: [phone]  Fax: [phone]  Primary Care Physician:  Jearld Fenton, NP   Pre-Procedure History & Physical: HPI:  Calvin Ortiz is a 50 y.o. male is here for an colonoscopy.   Past Medical History:  Diagnosis Date   Anxiety    Depression    Diabetes mellitus without complication ([place])    GERD (gastroesophageal reflux disease)    Hyperlipidemia    Hypertension    Migraines    Myocardial infarction [facility])    3 Stents   Sleep apnea    Stroke [facility])     Past Surgical History:  Procedure Laterality Date   BACK SURGERY  2005 and 2007   CORONARY ANGIOPLASTY WITH STENT PLACEMENT     CORONARY ARTERY BYPASS GRAFT  2013    Prior to Admission medications   Medication Sig Start Date End Date Taking? Authorizing Provider  amLODipine (NORVASC) 10 MG tablet TAKE 1 TABLET(10 MG) BY MOUTH DAILY 08/21/20  Yes Jearld Fenton, NP  aspirin EC 81 MG tablet Take 81 mg by mouth daily.   Yes [provider]  atorvastatin (LIPITOR) 40 MG tablet TAKE 1 TABLET(40 MG) BY MOUTH DAILY 08/21/20  Yes [name], Coralie Keens, NP  benazepril (LOTENSIN) 40 MG tablet Take 1 tablet (40 mg total) by mouth daily. 08/21/20  Yes Jearld Fenton, NP  carvedilol (COREG) 25 MG tablet Take 1 tablet (25 mg total) by mouth 2 (two) times daily with a meal. 08/21/20  Yes [name], Coralie Keens, NP  cyclobenzaprine (FLEXERIL) 10 MG tablet TAKE 1 TABLET(10 MG) BY MOUTH THREE TIMES DAILY AS NEEDED FOR MUSCLE SPASMS 09/26/20  Yes Jearld Fenton, NP  fluticasone [facility]) 50 MCG/ACT nasal spray 2 sprays in each nostril daily 07/31/20  Yes Karamalegos, Devonne Doughty, DO  isosorbide mononitrate (IMDUR) 120 MG 24 hr tablet Take 1 tablet (120 mg total) by mouth daily. 08/21/20  Yes Jearld Fenton, NP  pantoprazole (PROTONIX) 20 MG tablet TAKE 1 TABLET(20 MG) BY MOUTH DAILY 01/19/21  Yes [name], Coralie Keens, NP   acetaminophen (TYLENOL) 500 MG tablet Take 1,000 mg by mouth every 6 (six) hours as needed.    [provider]  fenofibrate (TRICOR) 145 MG tablet TAKE 1 TABLET(145 MG) BY MOUTH DAILY 03/03/20   Malfi, Lupita Raider, FNP  fexofenadine (ALLEGRA) 180 MG tablet Take 1 tablet (180 mg total) by mouth daily. 08/03/19   Malfi, Lupita Raider, FNP  FLUoxetine (PROZAC) 40 MG capsule Take 1 capsule (40 mg total) by mouth daily. 02/11/21   Jearld Fenton, NP  hydrOXYzine (ATARAX/VISTARIL) 25 MG tablet TAKE 2 TABLETS BY MOUTH EVERY NIGHT AT BEDTIME. MAY ALSO TAKE 1/2- 1 TABLET 2 TIMES DAILY AS NEEDED FOR ANXIETY 11/20/20   [name], Coralie Keens, NP  nitroGLYCERIN (NITROSTAT) 0.4 MG SL tablet Place 1 tablet (0.4 mg total) under the tongue every 5 (five) minutes as needed for chest pain (Take up to 3 doses). 10/10/20   Jearld Fenton, NP  pseudoephedrine-guaifenesin (MUCINEX D) 60-600 MG 12 hr tablet Take 1 tablet by mouth every 12 (twelve) hours.    [provider]    Allergies as of 02/26/2021 - Review Complete 02/11/2021  Allergen Reaction Noted   Buprenorphine hcl  09/03/2015   Morphine and related  10/30/2014   Sulfa antibiotics Rash 02/03/2012    Family History  Problem Relation  Age of Onset   COPD Mother    Hypertension Mother    Hyperlipidemia Mother    Depression Mother    Heart disease Mother    COPD Father    Diabetes Father    Heart disease Father    Stroke Father    Cancer Father        pelvic mass   Lung cancer Father    Depression Father    Diabetes Paternal Grandmother     Social History   Socioeconomic History   Marital status: Single    Spouse name: Not on file   Number of children: Not on file   Years of education: Not on file   Highest education level: Not on file  Occupational History   Not on file  Tobacco Use   Smoking status: Every Day    Packs/day: 0.00    Years: 25.00    Pack years: 0.00    Types: Cigarettes    Last attempt to quit: 04/13/2018    Years  since quitting: 2.9   Smokeless tobacco: Never   Tobacco comments:    Has quit off and on about 5-6 times (09/2017)  Vaping Use   Vaping Use: Never used  Substance and Sexual Activity   Alcohol use: No   Drug use: No   Sexual activity: Not Currently  Other Topics Concern   Not on file  Social History Narrative   Not on file   Social Determinants of Health   Financial Resource Strain: Not on file  Food Insecurity: Not on file  Transportation Needs: Not on file  Physical Activity: Not on file  Stress: Not on file  Social Connections: Not on file  Intimate Partner Violence: Not on file    Review of Systems: See HPI, otherwise negative ROS  Physical Exam: BP 130/79   Pulse 60   Temp (!) 96.6 F (35.9 C) (Temporal)   Resp 18   Ht 5\' 5"  (1.651 m)   SpO2 100%   BMI 31.82 kg/m  General:   Alert,  pleasant and cooperative in NAD Head:  Normocephalic and atraumatic. Neck:  Supple; no masses or thyromegaly. Lungs:  Clear throughout to auscultation, normal respiratory effort.    Heart:  +S1, +S2, Regular rate and rhythm, No edema. Abdomen:  Soft, nontender and nondistended. Normal bowel sounds, without guarding, and without rebound.   Neurologic:  Alert and  oriented x4;  grossly normal neurologically.  Impression/Plan: TAEGAN STANDAGE is here for an colonoscopy to be performed for Screening colonoscopy average risk   Risks, benefits, limitations, and alternatives regarding  colonoscopy have been reviewed with the patient.  Questions have been answered.  All parties agreeable.   Denice Paradise, MD  03/24/2021, 10:30 AM

## 2021-03-25 ENCOUNTER — Encounter: Payer: Self-pay | Admitting: Gastroenterology

## 2021-03-27 ENCOUNTER — Telehealth: Payer: Self-pay

## 2021-03-27 NOTE — Telephone Encounter (Signed)
Pt returned call. No answer at the office.

## 2021-03-27 NOTE — Telephone Encounter (Signed)
Copied from CRM [id number]. Topic: General - Other >> Mar 26, 2021  3:01 PM Jaquita Rector A wrote: Reason for CRM: Patient called in to tell Ms Sampson Si that he had been informed by the pharmacy that the medicaid that his FLUoxetine (PROZAC) 40 MG capsule is filled under is not paying and that the medicaid information need to be updated so patient can receive the medication because he is all out. Please call patient for further information Ph# [phone]

## 2021-04-01 ENCOUNTER — Other Ambulatory Visit: Payer: Self-pay | Admitting: Internal Medicine

## 2021-04-01 DIAGNOSIS — K219 Gastro-esophageal reflux disease without esophagitis: Secondary | ICD-10-CM

## 2021-04-01 DIAGNOSIS — E119 Type 2 diabetes mellitus without complications: Secondary | ICD-10-CM

## 2021-04-01 NOTE — Telephone Encounter (Signed)
Rx refilled 01/19/2021 #90 with 1 refill. Pt should have enough to last until 07/2021.

## 2021-04-01 NOTE — Telephone Encounter (Signed)
Requested Prescriptions  Pending Prescriptions Disp Refills   hydrOXYzine (ATARAX) 25 MG tablet [Pharmacy Med Name: HYDROXYZINE HCL 25MG  TABS (WHITE)] 120 tablet 1    Sig: TAKE 2 TABLETS BY MOUTH EVERY NIGHT AT BEDTIME. MAY ALSO TAKE 1/2- 1 TABLET 2 TIMES DAILY AS NEEDED FOR ANXIETY     Ear, Nose, and Throat:  Antihistamines Passed - 04/01/2021  5:34 PM      Passed - Valid encounter within last 12 months    Recent Outpatient Visits          1 month ago Encounter for general adult medical examination with abnormal findings   [facility] [place], [place], NP   6 months ago Chronic midline low back pain with right-sided sciatica   [facility] [place], Mullins, NP   7 months ago Acute midline low back pain with right-sided sciatica   [facility] [place], Mullins, NP   7 months ago Coronary artery disease involving native coronary artery of native heart without angina pectoris   [facility] [place], Mullins W, NP   11 months ago Acute left-sided low back pain with left-sided sciatica   [facility], [facility], FNP      Future Appointments            In 4 months [name], Jodelle Gross, NP [facility], [facility]            pantoprazole (PROTONIX) 20 MG tablet [Pharmacy Med Name: PANTOPRAZOLE 20MG  TABLETS] 90 tablet 1    Sig: TAKE 1 TABLET(20 MG) BY MOUTH DAILY     Gastroenterology: Proton Pump Inhibitors Passed - 04/01/2021  5:34 PM      Passed - Valid encounter within last 12 months    Recent Outpatient Visits          1 month ago Encounter for general adult medical examination with abnormal findings   [facility] [place], [facility], NP   6 months ago Chronic midline low back pain with right-sided sciatica   [facility] [place], [facility], NP   7 months ago Acute midline low back pain with right-sided sciatica   [facility] [place], [facility], NP   7 months  ago Coronary artery disease involving native coronary artery of native heart without angina pectoris   [facility] [place], [facility] W, NP   11 months ago Acute left-sided low back pain with left-sided sciatica   [facility], [place], FNP      Future Appointments            In 4 months [name], [facility], NP [facility], [facility]

## 2021-04-02 NOTE — Telephone Encounter (Signed)
Rx refilled 01/19/2021 #90 with 1 refill. Pt should have enough to last until 07/2021.  Requested Prescriptions  Pending Prescriptions Disp Refills   pantoprazole (PROTONIX) 20 MG tablet [Pharmacy Med Name: PANTOPRAZOLE 20MG  TABLETS] 90 tablet 1    Sig: TAKE 1 TABLET(20 MG) BY MOUTH DAILY     Gastroenterology: Proton Pump Inhibitors Passed - 04/01/2021  5:34 PM      Passed - Valid encounter within last 12 months    Recent Outpatient Visits          1 month ago Encounter for general adult medical examination with abnormal findings   [facility] [place], Mullins, NP   6 months ago Chronic midline low back pain with right-sided sciatica   [facility] [place], Mullins, NP   7 months ago Acute midline low back pain with right-sided sciatica   [facility] [place], Mullins, NP   7 months ago Coronary artery disease involving native coronary artery of native heart without angina pectoris   [facility] [place], Mullins W, NP   11 months ago Acute left-sided low back pain with left-sided sciatica   [facility], [facility], FNP      Future Appointments            In 4 months [name], Jodelle Gross, NP [facility], [facility]           Signed Prescriptions Disp Refills   hydrOXYzine (ATARAX) 25 MG tablet 120 tablet 1    Sig: TAKE 2 TABLETS BY MOUTH EVERY NIGHT AT BEDTIME. MAY ALSO TAKE 1/2- 1 TABLET 2 TIMES DAILY AS NEEDED FOR ANXIETY     Ear, Nose, and Throat:  Antihistamines Passed - 04/01/2021  5:34 PM      Passed - Valid encounter within last 12 months    Recent Outpatient Visits          1 month ago Encounter for general adult medical examination with abnormal findings   [facility] [place], Mullins, NP   6 months ago Chronic midline low back pain with right-sided sciatica   [facility] [place], Mullins, NP   7 months ago Acute midline low back pain with right-sided  sciatica   [facility] [place], Mullins, NP   7 months ago Coronary artery disease involving native coronary artery of native heart without angina pectoris   [facility] [place], Mullins W, NP   11 months ago Acute left-sided low back pain with left-sided sciatica   [facility], [facility], FNP      Future Appointments            In 4 months [name], Jodelle Gross, NP [facility], [facility]

## 2021-04-14 ENCOUNTER — Ambulatory Visit
Admission: EM | Admit: 2021-04-14 | Discharge: 2021-04-14 | Disposition: A | Discharge status: Home / Self Care | Payer: Disability Insurance | Attending: Emergency Medicine | Admitting: Emergency Medicine

## 2021-04-14 ENCOUNTER — Other Ambulatory Visit: Payer: Self-pay

## 2021-04-14 DIAGNOSIS — K122 Cellulitis and abscess of mouth: Secondary | ICD-10-CM

## 2021-04-14 MED ORDER — AMOXICILLIN-POT CLAVULANATE 875-125 MG PO TABS: 1.0000 | ORAL_TABLET | Freq: Two times a day (BID) | ORAL | 0 refills | Status: DC

## 2021-04-14 MED ORDER — TRAMADOL HCL 50 MG PO TABS: 50.0000 mg | ORAL_TABLET | Freq: Four times a day (QID) | ORAL | 0 refills | Status: AC | PRN

## 2021-04-14 MED ORDER — PREDNISONE 20 MG PO TABS: 40.0000 mg | ORAL_TABLET | Freq: Every day | ORAL | 0 refills | Status: DC

## 2021-04-14 MED ORDER — METHYLPREDNISOLONE SODIUM SUCC 125 MG IJ SOLR
60.0000 mg | Freq: Once | INTRAMUSCULAR | Status: AC
  Administered 2021-04-14: 19:00:00 60 mg via INTRAMUSCULAR

## 2021-04-14 NOTE — ED Triage Notes (Signed)
Pt here with C/O facial swelling since waking up this morning. NO known injury or dental pain.

## 2021-04-14 NOTE — ED Provider Notes (Signed)
[facility]    CSN: [id number] Arrival date & time: 04/14/21  1739      History   Chief Complaint Chief Complaint  Patient presents with   Facial Swelling    HPI Calvin Ortiz is a 50 y.o. male.   Patient presents with left-sided facial swelling and pain beginning this morning upon awakening.  Pain with chewing therefore difficult to tolerate food and liquids.  Denies fever, chills, drainage, difficulty swallowing, shortness of breath, wheezing, known need for dental work. Has attempted use of tylenol, which has not been helpful.  History of diabetes mellitus, GERD, hyperlipidemia, hypertension, migraines, MI, stroke.  Recently lost dentist due to insurance coverage.   Past Medical History:  Diagnosis Date   Anxiety    Depression    Diabetes mellitus without complication ([place])    GERD (gastroesophageal reflux disease)    Hyperlipidemia    Hypertension    Migraines    Myocardial infarction [facility])    3 Stents   Sleep apnea    Stroke [facility])     Patient Active Problem List   Diagnosis Date Noted   Mood disorder ([place]) 08/12/2020   History of MI (myocardial infarction) 10/11/2017   Diabetes mellitus without complication ([place]) [id number]   Coronary artery disease involving native coronary artery of native heart without angina pectoris 03/12/2015   Mixed hyperlipidemia 02/17/2012   Hypertension 02/07/2012    Past Surgical History:  Procedure Laterality Date   BACK SURGERY  2005 and 2007   COLONOSCOPY WITH PROPOFOL N/A 03/24/2021   Procedure: COLONOSCOPY WITH PROPOFOL;  Surgeon: Jonathon Bellows, MD;  Location: [facility] ENDOSCOPY;  Service: Gastroenterology;  Laterality: N/A;   CORONARY ANGIOPLASTY WITH STENT PLACEMENT     CORONARY ARTERY BYPASS GRAFT  2013       Home Medications    Prior to Admission medications   Medication Sig Start Date End Date Taking? Authorizing Provider  acetaminophen (TYLENOL) 500 MG tablet Take 1,000 mg by mouth every 6 (six)  hours as needed.   Yes [provider]  amLODipine (NORVASC) 10 MG tablet TAKE 1 TABLET(10 MG) BY MOUTH DAILY 08/21/20  Yes Jearld Fenton, NP  aspirin EC 81 MG tablet Take 81 mg by mouth daily.   Yes [provider]  atorvastatin (LIPITOR) 40 MG tablet TAKE 1 TABLET(40 MG) BY MOUTH DAILY 08/21/20  Yes [name], Coralie Keens, NP  benazepril (LOTENSIN) 40 MG tablet Take 1 tablet (40 mg total) by mouth daily. 08/21/20  Yes Jearld Fenton, NP  carvedilol (COREG) 25 MG tablet Take 1 tablet (25 mg total) by mouth 2 (two) times daily with a meal. 08/21/20  Yes [name], Coralie Keens, NP  cyclobenzaprine (FLEXERIL) 10 MG tablet TAKE 1 TABLET(10 MG) BY MOUTH THREE TIMES DAILY AS NEEDED FOR MUSCLE SPASMS 09/26/20  Yes Jearld Fenton, NP  fenofibrate (TRICOR) 145 MG tablet TAKE 1 TABLET(145 MG) BY MOUTH DAILY 03/03/20  Yes Malfi, Lupita Raider, FNP  fexofenadine (ALLEGRA) 180 MG tablet Take 1 tablet (180 mg total) by mouth daily. 08/03/19  Yes Malfi, Lupita Raider, FNP  FLUoxetine (PROZAC) 40 MG capsule Take 1 capsule (40 mg total) by mouth daily. 02/11/21  Yes Jearld Fenton, NP  fluticasone [facility]) 50 MCG/ACT nasal spray 2 sprays in each nostril daily 07/31/20  Yes Karamalegos, Devonne Doughty, DO  hydrOXYzine (ATARAX) 25 MG tablet TAKE 2 TABLETS BY MOUTH EVERY NIGHT AT BEDTIME. MAY ALSO TAKE 1/2- 1 TABLET 2 TIMES DAILY AS NEEDED FOR ANXIETY  04/01/21  Yes Lorre Munroe, NP  isosorbide mononitrate (IMDUR) 120 MG 24 hr tablet Take 1 tablet (120 mg total) by mouth daily. 08/21/20  Yes [name], Salvadore Oxford, NP  nitroGLYCERIN (NITROSTAT) 0.4 MG SL tablet Place 1 tablet (0.4 mg total) under the tongue every 5 (five) minutes as needed for chest pain (Take up to 3 doses). 10/10/20  Yes Lorre Munroe, NP  pantoprazole (PROTONIX) 20 MG tablet TAKE 1 TABLET(20 MG) BY MOUTH DAILY 01/19/21  Yes [name], Salvadore Oxford, NP  pseudoephedrine-guaifenesin (MUCINEX D) 60-600 MG 12 hr tablet Take 1 tablet by mouth every 12 (twelve) hours.   Yes [provider]    Family History Family History  Problem Relation Age of Onset   COPD Mother    Hypertension Mother    Hyperlipidemia Mother    Depression Mother    Heart disease Mother    COPD Father    Diabetes Father    Heart disease Father    Stroke Father    Cancer Father        pelvic mass   Lung cancer Father    Depression Father    Diabetes Paternal Grandmother     Social History Social History   Tobacco Use   Smoking status: Every Day    Packs/day: 0.00    Years: 25.00    Pack years: 0.00    Types: Cigarettes    Last attempt to quit: 04/13/2018    Years since quitting: 3.0   Smokeless tobacco: Never   Tobacco comments:    Has quit off and on about 5-6 times (09/2017)  Vaping Use   Vaping Use: Never used  Substance Use Topics   Alcohol use: No   Drug use: No     Allergies   Buprenorphine hcl, Morphine and related, and Sulfa antibiotics   Review of Systems Review of Systems  Constitutional: Negative.   HENT:  Positive for facial swelling. Negative for congestion, dental problem, drooling, ear discharge, ear pain, hearing loss, mouth sores, nosebleeds, postnasal drip, rhinorrhea, sinus pressure, sinus pain, sneezing, sore throat, tinnitus and trouble swallowing.   Respiratory: Negative.    Skin: Negative.     Physical Exam Triage Vital Signs ED Triage Vitals  Enc Vitals Group     BP 04/14/21 1747 130/84     Pulse Rate 04/14/21 1747 60     Resp 04/14/21 1747 16     Temp 04/14/21 1747 98.3 F (36.8 C)     Temp Source 04/14/21 1747 Oral     SpO2 04/14/21 1747 100 %     Weight 04/14/21 1746 193 lb (87.5 kg)     Height 04/14/21 1746 5\' 5"  (1.651 m)     Head Circumference --      Peak Flow --      Pain Score 04/14/21 1746 9     Pain Loc --      Pain Edu? --      Excl. in GC? --    No data found.  Updated Vital Signs BP 130/84 (BP Location: Right Arm)    Pulse 60    Temp 98.3 F (36.8 C) (Oral)    Resp 16    Ht 5\' 5"  (1.651 m)    Wt 193 lb  (87.5 kg)    SpO2 100%    BMI 32.12 kg/m   Visual Acuity Right Eye Distance:   Left Eye Distance:   Bilateral Distance:    Right Eye Near:  Left Eye Near:    Bilateral Near:     Physical Exam Constitutional:      Appearance: Normal appearance.  HENT:     Head: Normocephalic.     Comments: Moderate to severe swelling of the left cheek, no gingival swelling note, mild dental decay throughout oral cavity, pharynx is clear Eyes:     Extraocular Movements: Extraocular movements intact.  Pulmonary:     Effort: Pulmonary effort is normal.     Breath sounds: Normal breath sounds.  Skin:    General: Skin is warm and dry.  Neurological:     Mental Status: He is alert and oriented to person, place, and time. Mental status is at baseline.  Psychiatric:        Mood and Affect: Mood normal.        Behavior: Behavior normal.     UC Treatments / Results  Labs (all labs ordered are listed, but only abnormal results are displayed) Labs Reviewed - No data to display  EKG   Radiology No results found.  Procedures Procedures (including critical care time)  Medications Ordered in UC Medications - No data to display  Initial Impression / Assessment and Plan / UC Course  I have reviewed the triage vital signs and the nursing notes.  Pertinent labs & imaging results that were available during my care of the patient were reviewed by me and considered in my medical decision making (see chart for details).  Abscess of the left internal cheek  Vital signs are stable, patient in no signs of distress due to to severe swelling present, will administer methylprednisolone 60 mg IM now, prednisone 5-day course prescribed as well as Augmentin 7-day course, patient has attempted use of over-the-counter medications with no relief, prescribed tramadol for pain management, 12 pills prescribed, PDMP reviewed, low risk, advised salt water gargles, warm liquids, throat lozenges for additional  additional comfort, recommended dental follow-up Final Clinical Impressions(s) / UC Diagnoses   Final diagnoses:  None   Discharge Instructions   None    ED Prescriptions   None    PDMP not reviewed this encounter.   Hans Eden, NP 04/14/21 1843

## 2021-04-14 NOTE — Discharge Instructions (Signed)
Today you are being treated for an abscess of your cheek, abscesses or pockets of infection, possibly is related to your back tooth, please follow-up with a new dentist as soon as possible  Beginning tomorrow take prednisone every morning with food for the next 5 days, this medicine is to help reduce swelling  Take Augmentin twice a day for the next 7 days, this medication is to help get rid of any bacteria  May attempt salt water gargles, throat lozenges, warm tea for comfort  May follow-up with urgent care as needed

## 2021-04-23 ENCOUNTER — Encounter: Payer: Self-pay | Admitting: Internal Medicine

## 2021-04-23 DIAGNOSIS — K219 Gastro-esophageal reflux disease without esophagitis: Secondary | ICD-10-CM

## 2021-04-24 MED ORDER — PANTOPRAZOLE SODIUM 20 MG PO TBEC: DELAYED_RELEASE_TABLET | ORAL | 1 refills | Status: DC

## 2021-05-13 LAB — HM DIABETES EYE EXAM

## 2021-05-15 DIAGNOSIS — H5213 Myopia, bilateral: Secondary | ICD-10-CM | POA: Diagnosis not present

## 2021-06-08 ENCOUNTER — Other Ambulatory Visit: Payer: Self-pay | Admitting: Internal Medicine

## 2021-06-08 DIAGNOSIS — E7849 Other hyperlipidemia: Secondary | ICD-10-CM

## 2021-06-09 NOTE — Telephone Encounter (Signed)
Requested Prescriptions  Pending Prescriptions Disp Refills   atorvastatin (LIPITOR) 40 MG tablet [Pharmacy Med Name: ATORVASTATIN 40MG  TABLETS] 90 tablet 2    Sig: TAKE 1 TABLET(40 MG) BY MOUTH DAILY     Cardiovascular:  Antilipid - Statins Failed - 06/08/2021  7:06 AM      Failed - Lipid Panel in normal range within the last 12 months    Cholesterol, Total  Date Value Ref Range Status  12/10/2016 139 100 - 199 mg/dL Final   Cholesterol  Date Value Ref Range Status  02/12/2021 145 <200 mg/dL Final   LDL Cholesterol (Calc)  Date Value Ref Range Status  02/12/2021 80 mg/dL (calc) Final    Comment:    Reference range: <100 . Desirable range <100 mg/dL for primary prevention;   <70 mg/dL for patients with CHD or diabetic patients  with > or = 2 CHD risk factors. Marland Kitchen LDL-C is now calculated using the Martin-Hopkins  calculation, which is a validated novel method providing  better accuracy than the Friedewald equation in the  estimation of LDL-C.  Cresenciano Genre et al. Annamaria Helling. [id number]): 2061-2068  (http://education.Quest[facility].com/faq/FAQ164)    HDL  Date Value Ref Range Status  02/12/2021 27 (L) > OR = 40 mg/dL Final  12/10/2016 22 (L) >39 mg/dL Final   Triglycerides  Date Value Ref Range Status  02/12/2021 286 (H) <150 mg/dL Final    Comment:    . If a non-fasting specimen was collected, consider repeat triglyceride testing on a fasting specimen if clinically indicated.  Yates Decamp et al. J. of Clin. Lipidol. [id number]. Marland Kitchen          Passed - Patient is not pregnant      Passed - Valid encounter within last 12 months    Recent Outpatient Visits          3 months ago Encounter for general adult medical examination with abnormal findings   Calvin Ortiz, Calvin Keens, Calvin Ortiz   8 months ago Chronic midline low back pain with right-sided sciatica   Calvin Ortiz, Calvin Keens, Calvin Ortiz   9 months ago Acute midline low back pain with  right-sided sciatica   Calvin Ortiz, Calvin Keens, Calvin Ortiz   10 months ago Coronary artery disease involving native coronary artery of native heart without angina pectoris   Calvin Ortiz, Calvin Ortiz, Calvin Ortiz   1 year ago Acute left-sided low back pain with left-sided sciatica   Calvin Ortiz, Calvin Raider, Calvin Ortiz      Future Appointments            In 2 months [name], Calvin Keens, Calvin Ortiz [facility], [facility]

## 2021-07-06 ENCOUNTER — Other Ambulatory Visit: Payer: Self-pay | Admitting: Internal Medicine

## 2021-07-06 DIAGNOSIS — I10 Essential (primary) hypertension: Secondary | ICD-10-CM

## 2021-07-08 NOTE — Telephone Encounter (Signed)
Requested Prescriptions  ?Pending Prescriptions Disp Refills  ?? isosorbide mononitrate (IMDUR) 120 MG 24 hr tablet [Pharmacy Med Name: ISOSORBIDE MONONITRATE 120MG  ER TAB] 90 tablet 0  ?  Sig: TAKE 1 TABLET(120 MG) BY MOUTH DAILY  ?  ? Cardiovascular:  Nitrates Passed - 07/06/2021  7:04 AM  ?  ?  Passed - Last BP in normal range  ?  BP Readings from Last 1 Encounters:  ?04/14/21 130/84  ?   ?  ?  Passed - Last Heart Rate in normal range  ?  Pulse Readings from Last 1 Encounters:  ?04/14/21 60  ?   ?  ?  Passed - Valid encounter within last 12 months  ?  Recent Outpatient Visits   ?      ? 4 months ago Encounter for general adult medical examination with abnormal findings  ? [facility], [place] W, NP  ? 9 months ago Chronic midline low back pain with right-sided sciatica  ? [facility] [place], [place] W, NP  ? 10 months ago Acute midline low back pain with right-sided sciatica  ? [facility] [place], Mull[place] W, NP  ? 11 months ago Coronary artery disease involving native coronary artery of native heart without angina pectoris  ? [facility] [place], [place] W, NP  ? 1 year ago Acute left-sided low back pain with left-sided sciatica  ? [facility], [facility], FNP  ?  ?  ?Future Appointments   ?        ? In 1 month [name], Jodelle Gross, NP [facility], [facility]  ?  ? ?  ?  ?  ? ?

## 2021-07-29 ENCOUNTER — Other Ambulatory Visit: Payer: Self-pay | Admitting: Internal Medicine

## 2021-07-29 DIAGNOSIS — E119 Type 2 diabetes mellitus without complications: Secondary | ICD-10-CM

## 2021-07-30 NOTE — Telephone Encounter (Signed)
Requested Prescriptions  ?Pending Prescriptions Disp Refills  ?? hydrOXYzine (ATARAX) 25 MG tablet [Pharmacy Med Name: HYDROXYZINE HCL 25MG  TABS (WHITE)] 120 tablet 0  ?  Sig: TAKE 2 TABLETS BY MOUTH EVERY NIGHT AT BEDTIME. MAY ALSO TAKE 1/2- 1 TABLET 2 TIMES DAILY AS NEEDED FOR ANXIETY  ?  ? Ear, Nose, and Throat:  Antihistamines 2 Passed - 07/29/2021  1:23 PM  ?  ?  Passed - Cr in normal range and within 360 days  ?  Creat  ?Date Value Ref Range Status  ?02/12/2021 0.96 0.60 - 1.29 mg/dL Final  ?   ?  ?  Passed - Valid encounter within last 12 months  ?  Recent Outpatient Visits   ?      ? 5 months ago Encounter for general adult medical examination with abnormal findings  ? [facility] [place], Mullins W, NP  ? 10 months ago Chronic midline low back pain with right-sided sciatica  ? [facility] [place], Mullins W, NP  ? 11 months ago Acute midline low back pain with right-sided sciatica  ? [facility] [place], Mullins W, NP  ? 11 months ago Coronary artery disease involving native coronary artery of native heart without angina pectoris  ? [facility] [place], Mullins W, NP  ? 1 year ago Acute left-sided low back pain with left-sided sciatica  ? [facility], [facility], FNP  ?  ?  ?Future Appointments   ?        ? In 1 week [name], Jodelle Gross, NP [facility], [facility]  ?  ? ?  ?  ?  ? ? ?

## 2021-08-08 ENCOUNTER — Other Ambulatory Visit: Payer: Self-pay | Admitting: Internal Medicine

## 2021-08-08 DIAGNOSIS — I25118 Atherosclerotic heart disease of native coronary artery with other forms of angina pectoris: Secondary | ICD-10-CM

## 2021-08-08 DIAGNOSIS — I10 Essential (primary) hypertension: Secondary | ICD-10-CM

## 2021-08-08 NOTE — Telephone Encounter (Signed)
Requested Prescriptions  ?Pending Prescriptions Disp Refills  ?? nitroGLYCERIN (NITROSTAT) 0.4 MG SL tablet [Pharmacy Med Name: NITROGLYCERIN 0.4MG  SUB TAB 25S] 25 tablet 1  ?  Sig: PLACE 1 TABLET UNDER THE TONGUE EVERY 5 MINUTES AS NEEDED FOR CHEST PAIN. MAY TAKE UP TO 3 DOSES  ?  ? Cardiovascular:  Nitrates Passed - 08/08/2021  7:24 AM  ?  ?  Passed - Last BP in normal range  ?  BP Readings from Last 1 Encounters:  ?04/14/21 130/84  ?   ?  ?  Passed - Last Heart Rate in normal range  ?  Pulse Readings from Last 1 Encounters:  ?04/14/21 60  ?   ?  ?  Passed - Valid encounter within last 12 months  ?  Recent Outpatient Visits   ?      ? 5 months ago Encounter for general adult medical examination with abnormal findings  ? [facility] [place], [place] W, NP  ? 10 months ago Chronic midline low back pain with right-sided sciatica  ? [facility] [place], [place] W, NP  ? 11 months ago Acute midline low back pain with right-sided sciatica  ? [facility] [place], [place] W, NP  ? 12 months ago Coronary artery disease involving native coronary artery of native heart without angina pectoris  ? [facility] [place], [place] W, NP  ? 1 year ago Acute left-sided low back pain with left-sided sciatica  ? [facility], [place] Raider, FNP  ?  ?  ?Future Appointments   ?        ? In 4 days [name], Coralie Keens, NP [place]  ?  ? ?  ?  ?  ?? benazepril (LOTENSIN) 40 MG tablet [Pharmacy Med Name: BENAZEPRIL 40MG  TABLETS] 90 tablet 1  ?  Sig: TAKE 1 TABLET(40 MG) BY MOUTH DAILY  ?  ? Cardiovascular:  ACE Inhibitors Passed - 08/08/2021  7:24 AM  ?  ?  Passed - Cr in normal range and within 180 days  ?  Creat  ?Date Value Ref Range Status  ?02/12/2021 0.96 0.60 - 1.29 mg/dL Final  ?   ?  ?  Passed - K in normal range and within 180 days  ?  Potassium  ?Date Value Ref Range Status  ?02/12/2021 4.4 3.5 - 5.3 mmol/L Final  ?02/03/2012 3.5 3.5 - 5.1 mmol/L  Final  ?   ?  ?  Passed - Patient is not pregnant  ?  ?  Passed - Last BP in normal range  ?  BP Readings from Last 1 Encounters:  ?04/14/21 130/84  ?   ?  ?  Passed - Valid encounter within last 6 months  ?  Recent Outpatient Visits   ?      ? 5 months ago Encounter for general adult medical examination with abnormal findings  ? [facility] [place], [place] W, NP  ? 10 months ago Chronic midline low back pain with right-sided sciatica  ? [facility] [place], [place] W, NP  ? 11 months ago Acute midline low back pain with right-sided sciatica  ? [facility] [place], [place] W, NP  ? 12 months ago Coronary artery disease involving native coronary artery of native heart without angina pectoris  ? [facility] [place], [place] W, NP  ? 1 year ago Acute left-sided low back pain with left-sided  sciatica  ? [facility], [place] Raider, FNP  ?  ?  ?Future Appointments   ?        ? In 4 days [name], Coralie Keens, NP [facility], [place]  ?  ? ?  ?  ?  ? ? ?

## 2021-08-12 ENCOUNTER — Encounter: Payer: Self-pay | Admitting: Internal Medicine

## 2021-08-12 ENCOUNTER — Ambulatory Visit: Payer: Medicaid Other | Admitting: Internal Medicine

## 2021-08-12 VITALS — BP 126/82 | HR 60 | Resp 16 | Ht 65.0 in | Wt 200.8 lb

## 2021-08-12 DIAGNOSIS — I2510 Atherosclerotic heart disease of native coronary artery without angina pectoris: Secondary | ICD-10-CM

## 2021-08-12 DIAGNOSIS — K219 Gastro-esophageal reflux disease without esophagitis: Secondary | ICD-10-CM | POA: Diagnosis not present

## 2021-08-12 DIAGNOSIS — E782 Mixed hyperlipidemia: Secondary | ICD-10-CM | POA: Diagnosis not present

## 2021-08-12 DIAGNOSIS — F39 Unspecified mood [affective] disorder: Secondary | ICD-10-CM | POA: Diagnosis not present

## 2021-08-12 DIAGNOSIS — F32A Depression, unspecified: Secondary | ICD-10-CM

## 2021-08-12 DIAGNOSIS — Z6833 Body mass index (BMI) 33.0-33.9, adult: Secondary | ICD-10-CM

## 2021-08-12 DIAGNOSIS — E66811 Obesity, class 1: Secondary | ICD-10-CM | POA: Insufficient documentation

## 2021-08-12 DIAGNOSIS — D751 Secondary polycythemia: Secondary | ICD-10-CM

## 2021-08-12 DIAGNOSIS — I10 Essential (primary) hypertension: Secondary | ICD-10-CM | POA: Diagnosis not present

## 2021-08-12 DIAGNOSIS — I252 Old myocardial infarction: Secondary | ICD-10-CM | POA: Diagnosis not present

## 2021-08-12 DIAGNOSIS — E119 Type 2 diabetes mellitus without complications: Secondary | ICD-10-CM | POA: Diagnosis not present

## 2021-08-12 DIAGNOSIS — E7849 Other hyperlipidemia: Secondary | ICD-10-CM

## 2021-08-12 DIAGNOSIS — E6609 Other obesity due to excess calories: Secondary | ICD-10-CM

## 2021-08-12 DIAGNOSIS — F419 Anxiety disorder, unspecified: Secondary | ICD-10-CM | POA: Diagnosis not present

## 2021-08-12 MED ORDER — ATORVASTATIN CALCIUM 40 MG PO TABS: ORAL_TABLET | ORAL | 1 refills | Status: DC

## 2021-08-12 MED ORDER — ISOSORBIDE MONONITRATE ER 120 MG PO TB24: ORAL_TABLET | ORAL | 1 refills | Status: DC

## 2021-08-12 MED ORDER — BENAZEPRIL HCL 40 MG PO TABS: ORAL_TABLET | ORAL | 1 refills | Status: DC

## 2021-08-12 MED ORDER — PANTOPRAZOLE SODIUM 20 MG PO TBEC: DELAYED_RELEASE_TABLET | ORAL | 1 refills | Status: DC

## 2021-08-12 MED ORDER — AMLODIPINE BESYLATE 10 MG PO TABS: ORAL_TABLET | ORAL | 1 refills | Status: DC

## 2021-08-12 MED ORDER — FLUOXETINE HCL 40 MG PO CAPS: 40.0000 mg | ORAL_CAPSULE | Freq: Every day | ORAL | 1 refills | Status: DC

## 2021-08-12 MED ORDER — CARVEDILOL 25 MG PO TABS: 25.0000 mg | ORAL_TABLET | Freq: Two times a day (BID) | ORAL | 1 refills | Status: DC

## 2021-08-12 MED ORDER — HYDROXYZINE HCL 25 MG PO TABS: ORAL_TABLET | ORAL | 1 refills | Status: DC

## 2021-08-12 MED ORDER — FENOFIBRATE 145 MG PO TABS: ORAL_TABLET | ORAL | 1 refills | Status: DC

## 2021-08-12 NOTE — Assessment & Plan Note (Signed)
C-Met and lipid profile today ?Encouraged him to consume a low-fat diet ?Continue Atorvastatin ?

## 2021-08-12 NOTE — Assessment & Plan Note (Signed)
A1c and urine microalbumin today ?Encouraged him to consume a low carb diet and exercise for weight loss ?Not medicated ?We will request copy of eye exam ?Encourage routine foot exam ?Encouraged him to get a flu shot in the fall ?Pneumovax UTD ?Encouraged him to get his COVID booster ?

## 2021-08-12 NOTE — Patient Instructions (Signed)

## 2021-08-12 NOTE — Assessment & Plan Note (Signed)
Advised him to avoid foods that trigger his reflux ?Encourage weight loss as this can help reduce reflux symptoms ?Continue Pantoprazole ?

## 2021-08-12 NOTE — Assessment & Plan Note (Signed)
CBC and iron panel today ?Encourage smoking cessation ?

## 2021-08-12 NOTE — Assessment & Plan Note (Signed)
No angina ?C-Met and lipid profile today ?He will continue Atorvastatin,Ffenofibrate, Isosorbide, Carvedilol and Aspirin ?Encouraged him to consume a low-fat diet ?

## 2021-08-12 NOTE — Assessment & Plan Note (Signed)
Encouraged diet and exercise for weight loss ?

## 2021-08-12 NOTE — Assessment & Plan Note (Signed)
He will continue Fluoxetine and Hydroxyzine as prescribed ?Referral to psychology placed for therapy ?

## 2021-08-12 NOTE — Progress Notes (Signed)
? ?Subjective:  ? ? Patient ID: Calvin Ortiz, male    DOB: Nov 05, 1970, 51 y.o.   MRN: [id number] ? ?HPI ? ?Patient presents to clinic today for 26-month follow-up of chronic conditions. ? ?DM2: His last A1c was 5.4%, 01/2021.  He is not taking any oral diabetic medication at this time.  He is on Benazepril for renal protection.  He does not check his sugars.  He checks his feet routinely.  His last eye exam was 06/2021.  Flu 01/2021.  Pneumovax 07/2020.  COVID Moderna x3. ? ?HLD with CAD status post MI: His last LDL was 80, triglycerides 286, 01/2021.  He denies myalgias on Atorvastatin.  He is taking Fenofibrate, Isosorbide, Carvedilol and Aspirin as well.  He does not follow with cardiology. ? ?HTN: His BP today is 126/82.  He is taking Amlodipine, Benazepril and Carvedilol as prescribed.  ECG from 04/2020 reviewed. ? ?GERD: Triggered by tomato based sauces.  He denies breakthrough on Pantoprazole.  Upper GI from 03/2021 reviewed. ? ?Mood Disorder: Chronic, managed on Fluoxetine and Hydroxyzine.  He is not currently seeing a therapist.  He denies SI/HI. ? ?Polycythemia: His last H/H was 17.2/52.3, 01/2021.  He does smoke.  He does not follow with hematology. ? ?Review of Systems ? ?   ?Past Medical History:  ?Diagnosis Date  ? Anxiety   ? Depression   ? Diabetes mellitus without complication ([place])   ? GERD (gastroesophageal reflux disease)   ? Hyperlipidemia   ? Hypertension   ? Migraines   ? Myocardial infarction [facility])   ? 3 Stents  ? Sleep apnea   ? Stroke [facility])   ? ? ?Current Outpatient Medications  ?Medication Sig Dispense Refill  ? acetaminophen (TYLENOL) 500 MG tablet Take 1,000 mg by mouth every 6 (six) hours as needed.    ? amLODipine (NORVASC) 10 MG tablet TAKE 1 TABLET(10 MG) BY MOUTH DAILY 90 tablet 1  ? amoxicillin-clavulanate (AUGMENTIN) 875-125 MG tablet Take 1 tablet by mouth every 12 (twelve) hours. 14 tablet 0  ? aspirin EC 81 MG tablet Take 81 mg by mouth daily.    ? atorvastatin (LIPITOR) 40  MG tablet TAKE 1 TABLET(40 MG) BY MOUTH DAILY 90 tablet 2  ? benazepril (LOTENSIN) 40 MG tablet TAKE 1 TABLET(40 MG) BY MOUTH DAILY 90 tablet 1  ? carvedilol (COREG) 25 MG tablet Take 1 tablet (25 mg total) by mouth 2 (two) times daily with a meal. 180 tablet 1  ? cyclobenzaprine (FLEXERIL) 10 MG tablet TAKE 1 TABLET(10 MG) BY MOUTH THREE TIMES DAILY AS NEEDED FOR MUSCLE SPASMS 30 tablet 0  ? fenofibrate (TRICOR) 145 MG tablet TAKE 1 TABLET(145 MG) BY MOUTH DAILY 90 tablet 1  ? fexofenadine (ALLEGRA) 180 MG tablet Take 1 tablet (180 mg total) by mouth daily. 90 tablet 3  ? FLUoxetine (PROZAC) 40 MG capsule Take 1 capsule (40 mg total) by mouth daily. 90 capsule 3  ? fluticasone (FLONASE) 50 MCG/ACT nasal spray 2 sprays in each nostril daily 16 g 3  ? hydrOXYzine (ATARAX) 25 MG tablet TAKE 2 TABLETS BY MOUTH EVERY NIGHT AT BEDTIME. MAY ALSO TAKE 1/2- 1 TABLET 2 TIMES DAILY AS NEEDED FOR ANXIETY 120 tablet 0  ? isosorbide mononitrate (IMDUR) 120 MG 24 hr tablet TAKE 1 TABLET(120 MG) BY MOUTH DAILY 90 tablet 0  ? nitroGLYCERIN (NITROSTAT) 0.4 MG SL tablet PLACE 1 TABLET UNDER THE TONGUE EVERY 5 MINUTES AS NEEDED FOR CHEST PAIN. MAY TAKE UP TO 3  DOSES 25 tablet 1  ? pantoprazole (PROTONIX) 20 MG tablet TAKE 1 TABLET(20 MG) BY MOUTH DAILY 90 tablet 1  ? predniSONE (DELTASONE) 20 MG tablet Take 2 tablets (40 mg total) by mouth daily. 10 tablet 0  ? pseudoephedrine-guaifenesin (MUCINEX D) 60-600 MG 12 hr tablet Take 1 tablet by mouth every 12 (twelve) hours.    ? ?No current facility-administered medications for this visit.  ? ? ?Allergies  ?Allergen Reactions  ? Buprenorphine Hcl   ?  Other reaction(s): Other (See Comments), Other (See Comments)  ? Morphine And Related   ? Sulfa Antibiotics Rash  ? ? ?Family History  ?Problem Relation Age of Onset  ? COPD Mother   ? Hypertension Mother   ? Hyperlipidemia Mother   ? Depression Mother   ? Heart disease Mother   ? COPD Father   ? Diabetes Father   ? Heart disease Father   ?  Stroke Father   ? Cancer Father   ?     pelvic mass  ? Lung cancer Father   ? Depression Father   ? Diabetes Paternal Grandmother   ? ? ?Social History  ? ?Socioeconomic History  ? Marital status: Single  ?  Spouse name: Not on file  ? Number of children: Not on file  ? Years of education: Not on file  ? Highest education level: Not on file  ?Occupational History  ? Not on file  ?Tobacco Use  ? Smoking status: Every Day  ?  Packs/day: 0.00  ?  Years: 25.00  ?  Pack years: 0.00  ?  Types: Cigarettes  ?  Last attempt to quit: 04/13/2018  ?  Years since quitting: 3.3  ? Smokeless tobacco: Never  ? Tobacco comments:  ?  Has quit off and on about 5-6 times (09/2017)  ?Vaping Use  ? Vaping Use: Never used  ?Substance and Sexual Activity  ? Alcohol use: No  ? Drug use: No  ? Sexual activity: Not Currently  ?Other Topics Concern  ? Not on file  ?Social History Narrative  ? Not on file  ? ?Social Determinants of Health  ? ?Financial Resource Strain: Not on file  ?Food Insecurity: Not on file  ?Transportation Needs: Not on file  ?Physical Activity: Not on file  ?Stress: Not on file  ?Social Connections: Not on file  ?Intimate Partner Violence: Not on file  ? ? ? ?Constitutional: Denies fever, malaise, fatigue, headache or abrupt weight changes.  ?HEENT: Denies eye pain, eye redness, ear pain, ringing in the ears, wax buildup, runny nose, nasal congestion, bloody nose, or sore throat. ?Respiratory: Denies difficulty breathing, shortness of breath, cough or sputum production.   ?Cardiovascular: Denies chest pain, chest tightness, palpitations or swelling in the hands or feet.  ?Gastrointestinal: Denies abdominal pain, bloating, constipation, diarrhea or blood in the stool.  ?GU: Denies urgency, frequency, pain with urination, burning sensation, blood in urine, odor or discharge. ?Musculoskeletal: Denies decrease in range of motion, difficulty with gait, muscle pain or joint pain and swelling.  ?Skin: Denies redness, rashes,  lesions or ulcercations.  ?Neurological: Denies dizziness, difficulty with memory, difficulty with speech or problems with balance and coordination.  ?Psych: Patient has a history of anxiety and depression.  Denies SI/HI. ? ?No other specific complaints in a complete review of systems (except as listed in HPI above). ? ?Objective:  ? Physical Exam ? ?BP 126/82 (BP Location: Right Arm, Patient Position: Sitting, Cuff Size: Normal)   Pulse 60  Resp 16   Ht 5\' 5"  (1.651 m)   Wt 200 lb 12.8 oz (91.1 kg)   SpO2 97%   BMI 33.41 kg/m?  ? ?Wt Readings from Last 3 Encounters:  ?04/14/21 193 lb (87.5 kg)  ?02/11/21 191 lb 3.2 oz (86.7 kg)  ?09/19/20 191 lb 6.4 oz (86.8 kg)  ? ? ?General: Appears in stated age, obese in NAD. ?Skin: Warm, dry and intact. No ulcerations noted. ?HEENT: Head: normal shape and size; Eyes: sclera white, no icterus, conjunctiva pink, PERRLA and EOMs intact;  ?Cardiovascular: Bradycardic with normal rhythm. S1,S2 noted.  No murmur, rubs or gallops noted. No JVD or BLE edema. No carotid bruits noted. ?Pulmonary/Chest: Normal effort and positive vesicular breath sounds. No respiratory distress. No wheezes, rales or ronchi noted.  ?Abdomen: Normal bowel sounds.  ?Musculoskeletal:  No difficulty with gait.  ?Neurological: Alert and oriented. ?Psychiatric: Mood and affect normal. Behavior is normal. Judgment and thought content normal.  ? ?BMET ?   ?Component Value Date/Time  ? NA 141 02/12/2021 1011  ? NA 138 12/10/2016 1902  ? NA 140 02/03/2012 0313  ? K 4.4 02/12/2021 1011  ? K 3.5 02/03/2012 0313  ? CL 105 02/12/2021 1011  ? CL 106 02/03/2012 0313  ? CO2 24 02/12/2021 1011  ? CO2 22 02/03/2012 0313  ? GLUCOSE 102 (H) 02/12/2021 1011  ? GLUCOSE 151 (H) 02/03/2012 0313  ? BUN 11 02/12/2021 1011  ? BUN 15 12/10/2016 1902  ? BUN 9 02/03/2012 0313  ? CREATININE 0.96 02/12/2021 1011  ? CALCIUM 9.8 02/12/2021 1011  ? CALCIUM 9.2 02/03/2012 0313  ? GFRNONAA >60 05/07/2020 1612  ? GFRNONAA 66 08/17/2019  0948  ? GFRAA 77 08/17/2019 0948  ? ? ?Lipid Panel  ?   ?Component Value Date/Time  ? CHOL 145 02/12/2021 1011  ? CHOL 139 12/10/2016 1902  ? TRIG 286 (H) 02/12/2021 1011  ? HDL 27 (L) 02/12/2021 1011  ? HDL 22

## 2021-08-12 NOTE — Assessment & Plan Note (Signed)
Controlled on Amlodipine, Benazepril and Carvedilol ?Reinforced DASH diet and exercise for weight loss ?C-Met today ? ?

## 2021-08-13 LAB — LIPID PANEL
Cholesterol: 142 mg/dL (ref <200)
HDL: 32 mg/dL — ABNORMAL LOW (ref >=40)
LDL Cholesterol (Calc): 83 mg/dL (calc)
Non-HDL Cholesterol (Calc): 110 mg/dL (calc) (ref <130)
Total CHOL/HDL Ratio: 4.4 (calc) (ref <5.0)
Triglycerides: 167 mg/dL — ABNORMAL HIGH (ref <150)

## 2021-08-13 LAB — COMPLETE METABOLIC PANEL WITH GFR
AG Ratio: 1.8 (calc) (ref 1.0–2.5)
ALT: 17 U/L (ref 9–46)
AST: 16 U/L (ref 10–40)
Albumin: 4.2 g/dL (ref 3.6–5.1)
Alkaline phosphatase (APISO): 86 U/L (ref 36–130)
BUN: 11 mg/dL (ref 7–25)
CO2: 21 mmol/L (ref 20–32)
Calcium: 9.2 mg/dL (ref 8.6–10.3)
Chloride: 107 mmol/L (ref 98–110)
Creat: 0.86 mg/dL (ref 0.60–1.29)
Globulin: 2.4 g/dL (calc) (ref 1.9–3.7)
Glucose, Bld: 149 mg/dL — ABNORMAL HIGH (ref 65–139)
Potassium: 4.2 mmol/L (ref 3.5–5.3)
Sodium: 137 mmol/L (ref 135–146)
Total Bilirubin: 0.4 mg/dL (ref 0.2–1.2)
Total Protein: 6.6 g/dL (ref 6.1–8.1)
eGFR: 106 mL/min/{1.73_m2} (ref >=60)

## 2021-08-13 LAB — MICROALBUMIN / CREATININE URINE RATIO
Creatinine, Urine: 163 mg/dL (ref 20–320)
Microalb Creat Ratio: 7 mcg/mg creat (ref <30)
Microalb, Ur: 1.1 mg/dL

## 2021-08-13 LAB — CBC
HCT: 49.3 % (ref 38.5–50.0)
Hemoglobin: 16.4 g/dL (ref 13.2–17.1)
MCH: 28.5 pg (ref 27.0–33.0)
MCHC: 33.3 g/dL (ref 32.0–36.0)
MCV: 85.6 fL (ref 80.0–100.0)
MPV: 9.7 fL (ref 7.5–12.5)
Platelets: 240 10*3/uL (ref 140–400)
RBC: 5.76 10*6/uL (ref 4.20–5.80)
RDW: 13 % (ref 11.0–15.0)
WBC: 6.2 10*3/uL (ref 3.8–10.8)

## 2021-08-13 LAB — HEMOGLOBIN A1C
Hgb A1c MFr Bld: 5.7 % of total Hgb — ABNORMAL HIGH (ref <5.7)
Mean Plasma Glucose: 117 mg/dL
eAG (mmol/L): 6.5 mmol/L

## 2021-08-13 LAB — IRON,TIBC AND FERRITIN PANEL
%SAT: 26 % (calc) (ref 20–48)
Ferritin: 115 ng/mL (ref 38–380)
Iron: 81 ug/dL (ref 50–180)
TIBC: 317 mcg/dL (calc) (ref 250–425)

## 2021-11-11 ENCOUNTER — Other Ambulatory Visit: Payer: Self-pay | Admitting: Internal Medicine

## 2021-11-11 ENCOUNTER — Encounter: Payer: Self-pay | Admitting: Internal Medicine

## 2021-11-11 DIAGNOSIS — I10 Essential (primary) hypertension: Secondary | ICD-10-CM

## 2021-11-12 NOTE — Telephone Encounter (Signed)
Too soon last RF: 08/12/21 #90 1 RF  Requested Prescriptions  Refused Prescriptions Disp Refills  . amLODipine (NORVASC) 10 MG tablet [Pharmacy Med Name: AMLODIPINE BESYLATE 10MG  TABLETS] 90 tablet 1    Sig: TAKE 1 TABLET(10 MG) BY MOUTH DAILY     Cardiovascular: Calcium Channel Blockers 2 Passed - 11/11/2021  1:49 PM      Passed - Last BP in normal range    BP Readings from Last 1 Encounters:  08/12/21 126/82         Passed - Last Heart Rate in normal range    Pulse Readings from Last 1 Encounters:  08/12/21 60         Passed - Valid encounter within last 6 months    Recent Outpatient Visits          3 months ago Anxiety and depression   [facility] [place], Mullins, NP   9 months ago Encounter for general adult medical examination with abnormal findings   [facility] [place], Mullins, NP   1 year ago Chronic midline low back pain with right-sided sciatica   [facility] [place], Mullins, NP   1 year ago Acute midline low back pain with right-sided sciatica   [facility] [place], Mullins, NP   1 year ago Coronary artery disease involving native coronary artery of native heart without angina pectoris   [facility] [place], Mullins, NP      Future Appointments            In 3 months [name], Salvadore Oxford, NP [facility], [facility]

## 2021-11-13 ENCOUNTER — Other Ambulatory Visit: Payer: Self-pay | Admitting: Internal Medicine

## 2021-11-13 DIAGNOSIS — I10 Essential (primary) hypertension: Secondary | ICD-10-CM

## 2021-11-14 ENCOUNTER — Telehealth: Payer: Self-pay | Admitting: Internal Medicine

## 2021-11-14 NOTE — Telephone Encounter (Signed)
..   Medicaid Managed Care   Unsuccessful Outreach Note  11/14/2021 Name: Calvin Ortiz MRN: [id number] DOB: 10-10-1970  Referred by: Lorre Munroe, NP Reason for referral : High Risk Managed Medicaid (I called the patient today to get him scheduled with the MM Team. I left my name and number on his VM.)   An unsuccessful telephone outreach was attempted today. The patient was referred to the case management team for assistance with care management and care coordination.   Follow Up Plan: The care management team will reach out to the patient again over the next 7-14 days.     Weston Settle Care Guide, High Risk Medicaid Managed Care Embedded Care Coordination [facility]  [facility]    SIGNATURE

## 2021-11-14 NOTE — Telephone Encounter (Signed)
[facility] called and spoke to [facility] about the refill(s) amlodipine requested. Advised it was sent on 08/12/21 #90/1 refill(s). She states that medication is ready for pu that pt called in older rx number and generates refill request. No further assistance needed.

## 2021-11-14 NOTE — Telephone Encounter (Signed)
Requested Prescriptions  Pending Prescriptions Disp Refills  . amLODipine (NORVASC) 10 MG tablet [Pharmacy Med Name: AMLODIPINE BESYLATE 10MG  TABLETS] 90 tablet 1    Sig: TAKE 1 TABLET(10 MG) BY MOUTH DAILY     Cardiovascular: Calcium Channel Blockers 2 Passed - 11/13/2021  4:16 PM      Passed - Last BP in normal range    BP Readings from Last 1 Encounters:  08/12/21 126/82         Passed - Last Heart Rate in normal range    Pulse Readings from Last 1 Encounters:  08/12/21 60         Passed - Valid encounter within last 6 months    Recent Outpatient Visits          3 months ago Anxiety and depression   [facility] [place], Mullins, NP   9 months ago Encounter for general adult medical examination with abnormal findings   [facility] [place], Mullins, NP   1 year ago Chronic midline low back pain with right-sided sciatica   [facility] [place], Mullins, NP   1 year ago Acute midline low back pain with right-sided sciatica   [facility] [place], Mullins, NP   1 year ago Coronary artery disease involving native coronary artery of native heart without angina pectoris   [facility] [place], Mullins, NP      Future Appointments            In 3 months [name], Salvadore Oxford, NP [facility], [facility]

## 2021-11-15 ENCOUNTER — Other Ambulatory Visit: Payer: Self-pay | Admitting: Family Medicine

## 2021-11-15 DIAGNOSIS — I10 Essential (primary) hypertension: Secondary | ICD-10-CM

## 2021-11-17 NOTE — Telephone Encounter (Signed)
S/W pt. Refill not needed. Requested Prescriptions  Pending Prescriptions Disp Refills  . amLODipine (NORVASC) 10 MG tablet [Pharmacy Med Name: AMLODIPINE BESYLATE 10MG  TABLETS] 90 tablet 1    Sig: TAKE 1 TABLET(10 MG) BY MOUTH DAILY     Cardiovascular: Calcium Channel Blockers 2 Passed - 11/15/2021 12:47 PM      Passed - Last BP in normal range    BP Readings from Last 1 Encounters:  08/12/21 126/82         Passed - Last Heart Rate in normal range    Pulse Readings from Last 1 Encounters:  08/12/21 60         Passed - Valid encounter within last 6 months    Recent Outpatient Visits          3 months ago Anxiety and depression   [facility] [place], Mullins, NP   9 months ago Encounter for general adult medical examination with abnormal findings   [facility] [place], Mullins, NP   1 year ago Chronic midline low back pain with right-sided sciatica   [facility] [place], Mullins, NP   1 year ago Acute midline low back pain with right-sided sciatica   [facility] [place], Mullins, NP   1 year ago Coronary artery disease involving native coronary artery of native heart without angina pectoris   [facility] [place], Mullins, NP      Future Appointments            In 3 months [name], Salvadore Oxford, NP [facility], [facility]

## 2021-11-17 NOTE — Telephone Encounter (Signed)
Called pt. Pt has picked up medication. Refill not needed.

## 2022-01-15 ENCOUNTER — Other Ambulatory Visit: Payer: Self-pay | Admitting: Internal Medicine

## 2022-01-15 DIAGNOSIS — E119 Type 2 diabetes mellitus without complications: Secondary | ICD-10-CM

## 2022-01-15 NOTE — Telephone Encounter (Signed)
Requested Prescriptions  Pending Prescriptions Disp Refills  . hydrOXYzine (ATARAX) 25 MG tablet [Pharmacy Med Name: HYDROXYZINE HCL 25MG  TABS (WHITE)] 270 tablet 1    Sig: TAKE 2 TABLETS BY MOUTH EVERY NIGHT AT BEDTIME. MAY ALSO TAKE 1/2 TO 1 TABLET TWICE DAILY AS NEEDED FOR ANXIETY     Ear, Nose, and Throat:  Antihistamines 2 Passed - 01/15/2022 11:52 AM      Passed - Cr in normal range and within 360 days    Creat  Date Value Ref Range Status  08/12/2021 0.86 0.60 - 1.29 mg/dL Final   Creatinine, Urine  Date Value Ref Range Status  08/12/2021 163 20 - 320 mg/dL Final         Passed - Valid encounter within last 12 months    Recent Outpatient Visits          5 months ago Anxiety and depression   [facility] [place], Coralie Keens, NP   11 months ago Encounter for general adult medical examination with abnormal findings   [facility] [place], Coralie Keens, NP   1 year ago Chronic midline low back pain with right-sided sciatica   [facility] [place], [place], [place]   1 year ago Acute midline low back pain with right-sided sciatica   [facility] [place], Coralie Keens, NP   1 year ago Coronary artery disease involving native coronary artery of native heart without angina pectoris   [place], Coralie Keens, NP      Future Appointments            In 1 month [place], Coralie Keens, NP [facility], [facility]

## 2022-01-26 ENCOUNTER — Other Ambulatory Visit: Payer: Self-pay | Admitting: Internal Medicine

## 2022-01-27 NOTE — Telephone Encounter (Signed)
Requested Prescriptions  Pending Prescriptions Disp Refills  . FLUoxetine (PROZAC) 40 MG capsule [Pharmacy Med Name: FLUOXETINE 40MG  CAPSULES] 90 capsule 0    Sig: TAKE 1 CAPSULE(40 MG) BY MOUTH DAILY     Psychiatry:  Antidepressants - SSRI Passed - 01/26/2022  6:22 PM      Passed - Valid encounter within last 6 months    Recent Outpatient Visits          5 months ago Anxiety and depression   [facility] [place], Coralie Keens, NP   11 months ago Encounter for general adult medical examination with abnormal findings   [place], Coralie Keens, NP   1 year ago Chronic midline low back pain with right-sided sciatica   [facility] [place], Coralie Keens, NP   1 year ago Acute midline low back pain with right-sided sciatica   [facility] [place], Coralie Keens, NP   1 year ago Coronary artery disease involving native coronary artery of native heart without angina pectoris   [place], Coralie Keens, NP      Future Appointments            In 2 weeks Garnette Gunner, Coralie Keens, NP [facility], [facility]

## 2022-01-28 ENCOUNTER — Other Ambulatory Visit: Payer: Self-pay

## 2022-02-02 ENCOUNTER — Other Ambulatory Visit: Payer: Self-pay | Admitting: Internal Medicine

## 2022-02-02 DIAGNOSIS — E7849 Other hyperlipidemia: Secondary | ICD-10-CM

## 2022-02-03 NOTE — Telephone Encounter (Signed)
Requested Prescriptions  Pending Prescriptions Disp Refills  . fenofibrate (TRICOR) 145 MG tablet [Pharmacy Med Name: FENOFIBRATE 145MG TABLETS] 90 tablet 1    Sig: TAKE 1 TABLET(145 MG) BY MOUTH DAILY     Cardiovascular:  Antilipid - Fibric Acid Derivatives Failed - 02/02/2022  7:00 AM      Failed - Lipid Panel in normal range within the last 12 months    Cholesterol, Total  Date Value Ref Range Status  12/10/2016 139 100 - 199 mg/dL Final   Cholesterol  Date Value Ref Range Status  08/12/2021 142 <200 mg/dL Final   LDL Cholesterol (Calc)  Date Value Ref Range Status  08/12/2021 83 mg/dL (calc) Final    Comment:    Reference range: <100 . Desirable range <100 mg/dL for primary prevention;   <70 mg/dL for patients with CHD or diabetic patients  with > or = 2 CHD risk factors. Marland Kitchen LDL-C is now calculated using the Martin-Hopkins  calculation, which is a validated novel method providing  better accuracy than the Friedewald equation in the  estimation of LDL-C.  Cresenciano Genre et al. Annamaria Helling. 5916;384(66): 2061-2068  (http://education.[employer].com/faq/FAQ164)    HDL  Date Value Ref Range Status  08/12/2021 32 (L) > OR = 40 mg/dL Final  12/10/2016 22 (L) >39 mg/dL Final   Triglycerides  Date Value Ref Range Status  08/12/2021 167 (H) <150 mg/dL Final         Passed - ALT in normal range and within 360 days    ALT  Date Value Ref Range Status  08/12/2021 17 9 - 46 U/L Final   SGPT (ALT)  Date Value Ref Range Status  02/03/2012 35 12 - 78 U/L Final         Passed - AST in normal range and within 360 days    AST  Date Value Ref Range Status  08/12/2021 16 10 - 40 U/L Final   SGOT(AST)  Date Value Ref Range Status  02/03/2012 35 15 - 37 Unit/L Final         Passed - Cr in normal range and within 360 days    Creat  Date Value Ref Range Status  08/12/2021 0.86 0.60 - 1.29 mg/dL Final   Creatinine, Urine  Date Value Ref Range Status  08/12/2021 163 20 - 320  mg/dL Final         Passed - HGB in normal range and within 360 days    Hemoglobin  Date Value Ref Range Status  08/12/2021 16.4 13.2 - 17.1 g/dL Final  04/02/2016 14.9 13.0 - 17.7 g/dL Final         Passed - HCT in normal range and within 360 days    HCT  Date Value Ref Range Status  08/12/2021 49.3 38.5 - 50.0 % Final   Hematocrit  Date Value Ref Range Status  04/02/2016 43.6 37.5 - 51.0 % Final         Passed - PLT in normal range and within 360 days    Platelets  Date Value Ref Range Status  08/12/2021 240 140 - 400 Thousand/uL Final  04/02/2016 268 150 - 379 x10E3/uL Final         Passed - WBC in normal range and within 360 days    WBC  Date Value Ref Range Status  08/12/2021 6.2 3.8 - 10.8 Thousand/uL Final         Passed - eGFR is 30 or above and within 360 days  GFR, Est African American  Date Value Ref Range Status  08/17/2019 77 > OR = 60 mL/min/1.31m Final   GFR, Est Non African American  Date Value Ref Range Status  08/17/2019 66 > OR = 60 mL/min/1.717mFinal   GFR, Estimated  Date Value Ref Range Status  05/07/2020 >60 >60 mL/min Final    Comment:    (NOTE) Calculated using the CKD-EPI Creatinine Equation (2021)    eGFR  Date Value Ref Range Status  08/12/2021 106 > OR = 60 mL/min/1.7329minal    Comment:    The eGFR is based on the CKD-EPI 2021 equation. To calculate  the new eGFR from a previous Creatinine or Cystatin C result, go to https://www.kidney.org/professionals/ kdoqi/gfr%5Fcalculator          Passed - Valid encounter within last 12 months    Recent Outpatient Visits          5 months ago Anxiety and depression   [facility]   11 months ago Encounter for general adult medical examination with abnormal findings   [facility]   1 year ago Chronic midline low back pain with right-sided sciatica   [facility]   1 year  ago Acute midline low back pain with right-sided sciatica   [facility]   1 year ago Coronary artery disease involving native coronary artery of native heart without angina pectoris   [facility]      Future Appointments            In 1 week BaiGarnette GunneregCoralie [facility]ensP [facility]

## 2022-02-16 ENCOUNTER — Encounter: Payer: Self-pay | Admitting: Internal Medicine

## 2022-02-16 ENCOUNTER — Ambulatory Visit: Payer: Medicaid Other | Admitting: Internal Medicine

## 2022-02-16 VITALS — BP 126/84 | HR 86 | Temp 96.8°F | Ht 65.0 in | Wt 188.0 lb

## 2022-02-16 DIAGNOSIS — F17200 Nicotine dependence, unspecified, uncomplicated: Secondary | ICD-10-CM

## 2022-02-16 DIAGNOSIS — E119 Type 2 diabetes mellitus without complications: Secondary | ICD-10-CM | POA: Diagnosis not present

## 2022-02-16 DIAGNOSIS — Z23 Encounter for immunization: Secondary | ICD-10-CM

## 2022-02-16 DIAGNOSIS — Z6831 Body mass index (BMI) 31.0-31.9, adult: Secondary | ICD-10-CM

## 2022-02-16 DIAGNOSIS — E6609 Other obesity due to excess calories: Secondary | ICD-10-CM

## 2022-02-16 DIAGNOSIS — Z125 Encounter for screening for malignant neoplasm of prostate: Secondary | ICD-10-CM | POA: Diagnosis not present

## 2022-02-16 DIAGNOSIS — Z0001 Encounter for general adult medical examination with abnormal findings: Secondary | ICD-10-CM | POA: Diagnosis not present

## 2022-02-16 NOTE — Progress Notes (Signed)
Subjective:    Patient ID: Calvin Ortiz, male    DOB: 24-Jun-1970, 51 y.o.   MRN: [id number]  HPI  Patient presents to clinic today for his annual exam.  Flu: 01/2021 Tetanus: 01/2019 Pneumovax: 07/2020 COVID: x 2 Shingrix: Never PSA screening: Never Colon screening: 03/2021 Vision screening: annually Dentist: biannually  Diet: He does eat meat. He consumes fruits and veggies. He does eat some fried foods. He drinks mostly sweet tea. Exercise: Walking  Review of Systems     Past Medical History:  Diagnosis Date   Anxiety    Depression    Diabetes mellitus without complication (HCC)    GERD (gastroesophageal reflux disease)    Hyperlipidemia    Hypertension    Migraines    Myocardial infarction ([facility])    3 Stents   Sleep apnea    Stroke [facility])     Current Outpatient Medications  Medication Sig Dispense Refill   acetaminophen (TYLENOL) 500 MG tablet Take 1,000 mg by mouth every 6 (six) hours as needed.     amLODipine (NORVASC) 10 MG tablet TAKE 1 TABLET(10 MG) BY MOUTH DAILY 90 tablet 1   aspirin EC 81 MG tablet Take 81 mg by mouth daily.     atorvastatin (LIPITOR) 40 MG tablet TAKE 1 TABLET(40 MG) BY MOUTH DAILY 90 tablet 1   benazepril (LOTENSIN) 40 MG tablet TAKE 1 TABLET(40 MG) BY MOUTH DAILY 90 tablet 1   carvedilol (COREG) 25 MG tablet Take 1 tablet (25 mg total) by mouth 2 (two) times daily with a meal. 180 tablet 1   fenofibrate (TRICOR) 145 MG tablet TAKE 1 TABLET(145 MG) BY MOUTH DAILY 90 tablet 1   fexofenadine (ALLEGRA) 180 MG tablet Take 1 tablet (180 mg total) by mouth daily. 90 tablet 3   FLUoxetine (PROZAC) 40 MG capsule TAKE 1 CAPSULE(40 MG) BY MOUTH DAILY 90 capsule 0   fluticasone (FLONASE) 50 MCG/ACT nasal spray 2 sprays in each nostril daily 16 g 3   hydrOXYzine (ATARAX) 25 MG tablet TAKE 2 TABLETS BY MOUTH EVERY NIGHT AT BEDTIME. MAY ALSO TAKE 1/2 TO 1 TABLET TWICE DAILY AS NEEDED FOR ANXIETY 270 tablet 1   isosorbide mononitrate (IMDUR)  120 MG 24 hr tablet TAKE 1 TABLET(120 MG) BY MOUTH DAILY 90 tablet 1   nitroGLYCERIN (NITROSTAT) 0.4 MG SL tablet PLACE 1 TABLET UNDER THE TONGUE EVERY 5 MINUTES AS NEEDED FOR CHEST PAIN. MAY TAKE UP TO 3 DOSES 25 tablet 1   pantoprazole (PROTONIX) 20 MG tablet TAKE 1 TABLET(20 MG) BY MOUTH DAILY 90 tablet 1   No current facility-administered medications for this visit.    Allergies  Allergen Reactions   Buprenorphine Hcl     Other reaction(s): Other (See Comments), Other (See Comments)   Morphine And Related    Sulfa Antibiotics Rash    Family History  Problem Relation Age of Onset   COPD Mother    Hypertension Mother    Hyperlipidemia Mother    Depression Mother    Heart disease Mother    COPD Father    Diabetes Father    Heart disease Father    Stroke Father    Cancer Father        pelvic mass   Lung cancer Father    Depression Father    Diabetes Paternal Grandmother     Social History   Socioeconomic History   Marital status: Single    Spouse name: Not on file   Number of  children: Not on file   Years of education: Not on file   Highest education level: Not on file  Occupational History   Not on file  Tobacco Use   Smoking status: Every Day    Packs/day: 0.00    Years: 25.00    Total pack years: 0.00    Types: Cigarettes    Last attempt to quit: 04/13/2018    Years since quitting: 3.8   Smokeless tobacco: Never   Tobacco comments:    Has quit off and on about 5-6 times (09/2017)  Vaping Use   Vaping Use: Never used  Substance and Sexual Activity   Alcohol use: No   Drug use: No   Sexual activity: Not Currently  Other Topics Concern   Not on file  Social History Narrative   Not on file   Social Determinants of Health   Financial Resource Strain: Not on file  Food Insecurity: Not on file  Transportation Needs: Not on file  Physical Activity: Not on file  Stress: Not on file  Social Connections: Not on file  Intimate Partner Violence: Not on  file     Constitutional: Denies fever, malaise, fatigue, headache or abrupt weight changes.  HEENT: Denies eye pain, eye redness, ear pain, ringing in the ears, wax buildup, runny nose, nasal congestion, bloody nose, or sore throat. Respiratory: Denies difficulty breathing, shortness of breath, cough or sputum production.   Cardiovascular: Denies chest pain, chest tightness, palpitations or swelling in the hands or feet.  Gastrointestinal: Patient reports intermittent reflux.  Denies abdominal pain, bloating, constipation, diarrhea or blood in the stool.  GU: Denies urgency, frequency, pain with urination, burning sensation, blood in urine, odor or discharge. Musculoskeletal: Denies decrease in range of motion, difficulty with gait, muscle pain or joint pain and swelling.  Skin: Denies redness, rashes, lesions or ulcercations.  Neurological: Denies dizziness, difficulty with memory, difficulty with speech or problems with balance and coordination.  Psych: Patient has a history of anxiety and depression.  Denies SI/HI.  No other specific complaints in a complete review of systems (except as listed in HPI above).  Objective:   Physical Exam  BP 126/84 (BP Location: Left Arm, Patient Position: Sitting, Cuff Size: Normal)   Pulse 86   Temp (!) 96.8 F (36 C) (Temporal)   Ht 5' 5" (1.651 m)   Wt 188 lb (85.3 kg)   SpO2 99%   BMI 31.28 kg/m   Wt Readings from Last 3 Encounters:  08/12/21 200 lb 12.8 oz (91.1 kg)  04/14/21 193 lb (87.5 kg)  02/11/21 191 lb 3.2 oz (86.7 kg)    General: Appears his stated age, obese, in NAD. Skin: Warm, dry and intact. No ulcerations noted. HEENT: Head: normal shape and size; Eyes: sclera white, no icterus, conjunctiva pink, PERRLA and EOMs intact;  Neck:  Neck supple, trachea midline. No masses, lumps or thyromegaly present.  Cardiovascular: Normal rate and rhythm. S1,S2 noted.  No murmur, rubs or gallops noted. No JVD or BLE edema. No carotid bruits  noted.  Varicose veins noted of BLE. Pulmonary/Chest: Normal effort and positive vesicular breath sounds. No respiratory distress. No wheezes, rales or ronchi noted.  Abdomen: Normal bowel sounds.  Musculoskeletal: Strength 5/5 BUE/BLE.  No difficulty with gait.  Neurological: Alert and oriented. Cranial nerves II-XII grossly intact. Coordination normal.  Psychiatric: Mood and affect normal. Behavior is normal. Judgment and thought content normal.   BMET    Component Value Date/Time   NA 137  08/12/2021 1348   NA 138 12/10/2016 1902   NA 140 02/03/2012 0313   K 4.2 08/12/2021 1348   K 3.5 02/03/2012 0313   CL 107 08/12/2021 1348   CL 106 02/03/2012 0313   CO2 21 08/12/2021 1348   CO2 22 02/03/2012 0313   GLUCOSE 149 (H) 08/12/2021 1348   GLUCOSE 151 (H) 02/03/2012 0313   BUN 11 08/12/2021 1348   BUN 15 12/10/2016 1902   BUN 9 02/03/2012 0313   CREATININE 0.86 08/12/2021 1348   CALCIUM 9.2 08/12/2021 1348   CALCIUM 9.2 02/03/2012 0313   GFRNONAA >60 05/07/2020 1612   GFRNONAA 66 08/17/2019 0948   GFRAA 77 08/17/2019 0948    Lipid Panel     Component Value Date/Time   CHOL 142 08/12/2021 1348   CHOL 139 12/10/2016 1902   TRIG 167 (H) 08/12/2021 1348   HDL 32 (L) 08/12/2021 1348   HDL 22 (L) 12/10/2016 1902   CHOLHDL 4.4 08/12/2021 1348   LDLCALC 83 08/12/2021 1348    CBC    Component Value Date/Time   WBC 6.2 08/12/2021 1348   RBC 5.76 08/12/2021 1348   HGB 16.4 08/12/2021 1348   HGB 14.9 04/02/2016 1939   HCT 49.3 08/12/2021 1348   HCT 43.6 04/02/2016 1939   PLT 240 08/12/2021 1348   PLT 268 04/02/2016 1939   MCV 85.6 08/12/2021 1348   MCV 86 04/02/2016 1939   MCV 87 02/03/2012 0313   MCH 28.5 08/12/2021 1348   MCHC 33.3 08/12/2021 1348   RDW 13.0 08/12/2021 1348   RDW 13.3 04/02/2016 1939   RDW 13.0 02/03/2012 0313   LYMPHSABS 3,717 08/17/2019 0948   LYMPHSABS 3.3 (H) 04/02/2016 1939   EOSABS 351 08/17/2019 0948   EOSABS 0.3 04/02/2016 1939   BASOSABS  99 08/17/2019 0948   BASOSABS 0.1 04/02/2016 1939    Hgb A1C Lab Results  Component Value Date   HGBA1C 5.7 (H) 08/12/2021            Assessment & Plan:   Preventative Health Maintenance:  Flu shot today Tetanus UTD Pneumovax UTD Discussed Shingrix vaccine, he will check coverage with his insurance company and schedule a nurse visit if he would like to have this done Colon screening UTD CT lung cancer screening ordered Encourage him to consume a balanced diet and exercise regimen Advised him to see an eye doctor and dentist annually We will check CBC, c-Met, lipid, A1c and PSA today  RTC in 6 months, follow-up chronic conditions Webb Silversmith, NP

## 2022-02-16 NOTE — Patient Instructions (Signed)
Health Maintenance, Male Adopting a healthy lifestyle and getting preventive care are important in promoting health and wellness. Ask your health care provider about: The right schedule for you to have regular tests and exams. Things you can do on your own to prevent diseases and keep yourself healthy. What should I know about diet, weight, and exercise? Eat a healthy diet  Eat a diet that includes plenty of vegetables, fruits, low-fat dairy products, and lean protein. Do not eat a lot of foods that are high in solid fats, added sugars, or sodium. Maintain a healthy weight Body mass index (BMI) is a measurement that can be used to identify possible weight problems. It estimates body fat based on height and weight. Your health care provider can help determine your BMI and help you achieve or maintain a healthy weight. Get regular exercise Get regular exercise. This is one of the most important things you can do for your health. Most adults should: Exercise for at least 150 minutes each week. The exercise should increase your heart rate and make you sweat (moderate-intensity exercise). Do strengthening exercises at least twice a week. This is in addition to the moderate-intensity exercise. Spend less time sitting. Even light physical activity can be beneficial. Watch cholesterol and blood lipids Have your blood tested for lipids and cholesterol at 50 years of age, then have this test every 5 years. You may need to have your cholesterol levels checked more often if: Your lipid or cholesterol levels are high. You are older than 50 years of age. You are at high risk for heart disease. What should I know about cancer screening? Many types of cancers can be detected early and may often be prevented. Depending on your health history and family history, you may need to have cancer screening at various ages. This may include screening for: Colorectal cancer. Prostate cancer. Skin cancer. Lung  cancer. What should I know about heart disease, diabetes, and high blood pressure? Blood pressure and heart disease High blood pressure causes heart disease and increases the risk of stroke. This is more likely to develop in people who have high blood pressure readings or are overweight. Talk with your health care provider about your target blood pressure readings. Have your blood pressure checked: Every 3-5 years if you are 18-39 years of age. Every year if you are 40 years old or older. If you are between the ages of 65 and 75 and are a current or former smoker, ask your health care provider if you should have a one-time screening for abdominal aortic aneurysm (AAA). Diabetes Have regular diabetes screenings. This checks your fasting blood sugar level. Have the screening done: Once every three years after age 45 if you are at a normal weight and have a low risk for diabetes. More often and at a younger age if you are overweight or have a high risk for diabetes. What should I know about preventing infection? Hepatitis B If you have a higher risk for hepatitis B, you should be screened for this virus. Talk with your health care provider to find out if you are at risk for hepatitis B infection. Hepatitis C Blood testing is recommended for: Everyone born from 1945 through 1965. Anyone with known risk factors for hepatitis C. Sexually transmitted infections (STIs) You should be screened each year for STIs, including gonorrhea and chlamydia, if: You are sexually active and are younger than 50 years of age. You are older than 50 years of age and your   health care provider tells you that you are at risk for this type of infection. Your sexual activity has changed since you were last screened, and you are at increased risk for chlamydia or gonorrhea. Ask your health care provider if you are at risk. Ask your health care provider about whether you are at high risk for HIV. Your health care provider  may recommend a prescription medicine to help prevent HIV infection. If you choose to take medicine to prevent HIV, you should first get tested for HIV. You should then be tested every 3 months for as long as you are taking the medicine. Follow these instructions at home: Alcohol use Do not drink alcohol if your health care provider tells you not to drink. If you drink alcohol: Limit how much you have to 0-2 drinks a day. Know how much alcohol is in your drink. In the [place] one drink equals one 12 oz bottle of beer (355 mL), one 5 oz glass of wine (148 mL), or one 1 oz glass of hard liquor (44 mL). Lifestyle Do not use any products that contain nicotine or tobacco. These products include cigarettes, chewing tobacco, and vaping devices, such as e-cigarettes. If you need help quitting, ask your health care provider. Do not use street drugs. Do not share needles. Ask your health care provider for help if you need support or information about quitting drugs. General instructions Schedule regular health, dental, and eye exams. Stay current with your vaccines. Tell your health care provider if: You often feel depressed. You have ever been abused or do not feel safe at home. Summary Adopting a healthy lifestyle and getting preventive care are important in promoting health and wellness. Follow your health care provider's instructions about healthy diet, exercising, and getting tested or screened for diseases. Follow your health care provider's instructions on monitoring your cholesterol and blood pressure. This information is not intended to replace advice given to you by your health care provider. Make sure you discuss any questions you have with your health care provider. Document Revised: 08/26/2020 Document Reviewed: 08/26/2020 Elsevier Patient Education  2023 [employer]  

## 2022-02-16 NOTE — Assessment & Plan Note (Signed)
Encouraged diet and exercise for weight loss ?

## 2022-02-16 NOTE — Assessment & Plan Note (Deleted)
C-Met and lipid profile today

## 2022-02-17 LAB — COMPLETE METABOLIC PANEL WITH GFR
AG Ratio: 1.7 (calc) (ref 1.0–2.5)
ALT: 14 U/L (ref 9–46)
AST: 15 U/L (ref 10–35)
Albumin: 4.1 g/dL (ref 3.6–5.1)
Alkaline phosphatase (APISO): 66 U/L (ref 35–144)
BUN: 15 mg/dL (ref 7–25)
CO2: 19 mmol/L — ABNORMAL LOW (ref 20–32)
Calcium: 9 mg/dL (ref 8.6–10.3)
Chloride: 112 mmol/L — ABNORMAL HIGH (ref 98–110)
Creat: 1.11 mg/dL (ref 0.70–1.30)
Globulin: 2.4 g/dL (calc) (ref 1.9–3.7)
Glucose, Bld: 142 mg/dL — ABNORMAL HIGH (ref 65–139)
Potassium: 4.2 mmol/L (ref 3.5–5.3)
Sodium: 141 mmol/L (ref 135–146)
Total Bilirubin: 0.3 mg/dL (ref 0.2–1.2)
Total Protein: 6.5 g/dL (ref 6.1–8.1)
eGFR: 81 mL/min/{1.73_m2} (ref >=60)

## 2022-02-17 LAB — CBC
HCT: 45 % (ref 38.5–50.0)
Hemoglobin: 14.8 g/dL (ref 13.2–17.1)
MCH: 29 pg (ref 27.0–33.0)
MCHC: 32.9 g/dL (ref 32.0–36.0)
MCV: 88.2 fL (ref 80.0–100.0)
MPV: 10.4 fL (ref 7.5–12.5)
Platelets: 308 10*3/uL (ref 140–400)
RBC: 5.1 10*6/uL (ref 4.20–5.80)
RDW: 13.1 % (ref 11.0–15.0)
WBC: 6.9 10*3/uL (ref 3.8–10.8)

## 2022-02-17 LAB — HEMOGLOBIN A1C
Hgb A1c MFr Bld: 5.6 % of total Hgb (ref <5.7)
Mean Plasma Glucose: 114 mg/dL
eAG (mmol/L): 6.3 mmol/L

## 2022-02-17 LAB — LIPID PANEL
Cholesterol: 145 mg/dL (ref <200)
HDL: 40 mg/dL (ref >=40)
LDL Cholesterol (Calc): 82 mg/dL (calc)
Non-HDL Cholesterol (Calc): 105 mg/dL (calc) (ref <130)
Total CHOL/HDL Ratio: 3.6 (calc) (ref <5.0)
Triglycerides: 135 mg/dL (ref <150)

## 2022-02-17 LAB — PSA: PSA: 0.4 ng/mL (ref <=4.00)

## 2022-04-10 ENCOUNTER — Other Ambulatory Visit: Payer: Self-pay | Admitting: Internal Medicine

## 2022-04-10 DIAGNOSIS — I252 Old myocardial infarction: Secondary | ICD-10-CM

## 2022-04-10 DIAGNOSIS — K219 Gastro-esophageal reflux disease without esophagitis: Secondary | ICD-10-CM

## 2022-04-10 DIAGNOSIS — I10 Essential (primary) hypertension: Secondary | ICD-10-CM

## 2022-04-10 NOTE — Telephone Encounter (Signed)
Requested Prescriptions  Pending Prescriptions Disp Refills   isosorbide mononitrate (IMDUR) 120 MG 24 hr tablet [Pharmacy Med Name: ISOSORBIDE MONONITRATE 120MG  ER TAB] 90 tablet 1    Sig: TAKE 1 TABLET(120 MG) BY MOUTH DAILY     Cardiovascular:  Nitrates Passed - 04/10/2022  7:08 AM      Passed - Last BP in normal range    BP Readings from Last 1 Encounters:  02/16/22 126/84         Passed - Last Heart Rate in normal range    Pulse Readings from Last 1 Encounters:  02/16/22 86         Passed - Valid encounter within last 12 months    Recent Outpatient Visits           1 month ago Encounter for general adult medical examination with abnormal findings   [facility] [place], [place], NP   8 months ago Anxiety and depression   [facility] [place], [place], NP   1 year ago Encounter for general adult medical examination with abnormal findings   [facility] [place], Mullins, NP   1 year ago Chronic midline low back pain with right-sided sciatica   [facility] [place], Mullins, NP   1 year ago Acute midline low back pain with right-sided sciatica   [facility] [place], Mullins, NP       Future Appointments             In 4 months [name], Salvadore [place], NP [facility]             pantoprazole (PROTONIX) 20 MG tablet [Pharmacy Med Name: PANTOPRAZOLE 20MG  TABLETS] 90 tablet 1    Sig: TAKE 1 TABLET(20 MG) BY MOUTH DAILY     Gastroenterology: Proton Pump Inhibitors Passed - 04/10/2022  7:08 AM      Passed - Valid encounter within last 12 months    Recent Outpatient Visits           1 month ago Encounter for general adult medical examination with abnormal findings   [facility] [place], [facility], NP   8 months ago Anxiety and depression   [facility] [place], [facility], NP   1 year ago Encounter for general adult medical examination with abnormal findings    [facility] [place], [facility], NP   1 year ago Chronic midline low back pain with right-sided sciatica   [facility] [place], [facility], NP   1 year ago Acute midline low back pain with right-sided sciatica   [facility] [place], [facility], NP       Future Appointments             In 4 months [name], Mullins, NP [facility]             carvedilol (COREG) 25 MG tablet [Pharmacy Med Name: CARVEDILOL 25MG  TABLETS] 180 tablet 1    Sig: TAKE 1 TABLET(25 MG) BY MOUTH TWICE DAILY WITH A MEAL     Cardiovascular: Beta Blockers 3 Passed - 04/10/2022  7:08 AM      Passed - Cr in normal range and within 360 days    Creat  Date Value Ref Range Status  02/16/2022 1.11 0.70 - 1.30 mg/dL Final   Creatinine, Urine  Date Value Ref Range Status  08/12/2021 163 20 - 320 mg/dL Final         Passed - AST in normal range and within 360 days    AST  Date Value Ref Range Status  02/16/2022 15 10 - 35 U/L Final   SGOT(AST)  Date Value Ref Range Status  02/03/2012 35 15 - 37 Unit/L Final         Passed - ALT in normal range and within 360 days    ALT  Date Value Ref Range Status  02/16/2022 14 9 - 46 U/L Final   SGPT (ALT)  Date Value Ref Range Status  02/03/2012 35 12 - 78 U/L Final         Passed - Last BP in normal range    BP Readings from Last 1 Encounters:  02/16/22 126/84         Passed - Last Heart Rate in normal range    Pulse Readings from Last 1 Encounters:  02/16/22 86         Passed - Valid encounter within last 6 months    Recent Outpatient Visits           1 month ago Encounter for general adult medical examination with abnormal findings   [facility] [place], Salvadore [place], NP   8 months ago Anxiety and depression   [facility] [place], Salvadore Oxford, NP   1 year ago Encounter for general adult medical examination with abnormal findings   [facility] [place],  Salvadore Oxford, NP   1 year ago Chronic midline low back pain with right-sided sciatica   [facility] [place], Salvadore Oxford, NP   1 year ago Acute midline low back pain with right-sided sciatica   [facility] [place], Salvadore Oxford, NP       Future Appointments             In 4 months [name], Salvadore [place], NP [facility]

## 2022-04-17 ENCOUNTER — Other Ambulatory Visit: Payer: Self-pay | Admitting: *Deleted

## 2022-04-17 DIAGNOSIS — Z122 Encounter for screening for malignant neoplasm of respiratory organs: Secondary | ICD-10-CM

## 2022-04-17 DIAGNOSIS — Z87891 Personal history of nicotine dependence: Secondary | ICD-10-CM

## 2022-04-17 DIAGNOSIS — F17210 Nicotine dependence, cigarettes, uncomplicated: Secondary | ICD-10-CM

## 2022-04-30 ENCOUNTER — Other Ambulatory Visit: Payer: Self-pay | Admitting: Internal Medicine

## 2022-04-30 ENCOUNTER — Other Ambulatory Visit: Payer: Self-pay

## 2022-04-30 DIAGNOSIS — I10 Essential (primary) hypertension: Secondary | ICD-10-CM

## 2022-04-30 NOTE — Telephone Encounter (Signed)
Copied from [place] [phone]. Topic: General - Other >> Apr 30, 2022  4:40 PM Everette C wrote: Reason for CRM: Medication Refill - Medication: amLODipine (NORVASC) 10 MG tablet [[id number]]  Has the patient contacted their pharmacy? Yes.   (Agent: If no, request that the patient contact the pharmacy for the refill. If patient does not wish to contact the pharmacy document the reason why and proceed with request.) (Agent: If yes, when and what did the pharmacy advise?)  Preferred Pharmacy (with phone number or street name): [facility] [place], [address] [place]: [phone] Fax: [phone]: Not open 24 hours   Has the patient been seen for an appointment in the last year OR does the patient have an upcoming appointment? Yes.    Agent: Please be advised that RX refills may take up to 3 business days. We ask that you follow-up with your pharmacy.

## 2022-05-01 MED ORDER — AMLODIPINE BESYLATE 10 MG PO TABS: ORAL_TABLET | ORAL | 1 refills | Status: DC

## 2022-05-01 NOTE — Telephone Encounter (Signed)
Requested Prescriptions  Pending Prescriptions Disp Refills   amLODipine (NORVASC) 10 MG tablet 90 tablet 1    Sig: TAKE 1 TABLET(10 MG) BY MOUTH DAILY     Cardiovascular: Calcium Channel Blockers 2 Passed - 04/30/2022  5:16 PM      Passed - Last BP in normal range    BP Readings from Last 1 Encounters:  02/16/22 126/84         Passed - Last Heart Rate in normal range    Pulse Readings from Last 1 Encounters:  02/16/22 86         Passed - Valid encounter within last 6 months    Recent Outpatient Visits           2 months ago Encounter for general adult medical examination with abnormal findings   [facility] [place], Coralie Keens, NP   8 months ago Anxiety and depression   [facility] [place], Coralie Keens, NP   1 year ago Encounter for general adult medical examination with abnormal findings   [facility] [place], Coralie Keens, NP   1 year ago Chronic midline low back pain with right-sided sciatica   [facility] [place], Coralie Keens, NP   1 year ago Acute midline low back pain with right-sided sciatica   [facility] [place], Coralie Keens, NP       Future Appointments             In 3 months [name], Coralie Keens, NP [facility]

## 2022-05-25 NOTE — Progress Notes (Unsigned)
Virtual Visit via Video Note  I connected with Calvin Ortiz on 05/25/22 at  1:30 PM EST by a video enabled telemedicine application and verified that I am speaking with the correct person using two identifiers.  Location: Patient: Home Provider: Office   I discussed the limitations of evaluation and management by telemedicine and the availability of in person appointments. The patient expressed understanding and agreed to proceed.  Shared Decision Making Visit Lung Cancer Screening Program [phone])   Eligibility: Age 52 y.o. Pack Years Smoking History Calculation 20 (# packs/per year x # years smoked) Recent History of coughing up blood  no Unexplained weight loss? no ( >Than 15 pounds within the last 6 months ) Prior History Lung / other cancer no (Diagnosis within the last 5 years already requiring surveillance chest CT Scans). Smoking Status Current Smoker Former Smokers: Years since quit: NA  Quit Date: NA  Visit Components: Discussion included one or more decision making aids. yes Discussion included risk/benefits of screening. yes Discussion included potential follow up diagnostic testing for abnormal scans. yes Discussion included meaning and risk of over diagnosis. yes Discussion included meaning and risk of False Positives. yes Discussion included meaning of total radiation exposure. yes  Counseling Included: Importance of adherence to annual lung cancer LDCT screening. yes Impact of comorbidities on ability to participate in the program. yes Ability and willingness to under diagnostic treatment. yes  Smoking Cessation Counseling: Current Smokers:  Discussed importance of smoking cessation. yes Information about tobacco cessation classes and interventions provided to patient. yes Patient provided with "ticket" for LDCT Scan. NA Symptomatic Patient. no  Counseling(Intermediate counseling: > three minutes) 99406 Diagnosis Code: Tobacco Use  Z72.0 Asymptomatic Patient yes  Counseling (Intermediate counseling: > three minutes counseling) S0630 Former Smokers:  Discussed the importance of maintaining cigarette abstinence. yes Diagnosis Code: Personal History of Nicotine Dependence. Z60.109 Information about tobacco cessation classes and interventions provided to patient. Yes Patient provided with "ticket" for LDCT Scan. NA Written Order for Lung Cancer Screening with LDCT placed in Epic. Yes (CT Chest Lung Cancer Screening Low Dose W/O CM) [id number] Z12.2-Screening of respiratory organs Z87.891-Personal history of nicotine dependence   I have spent 25 minutes of face to face/ virtual visit   time with Calvin Ortiz discussing the risks and benefits of lung cancer screening. We viewed / discussed a power point together that explained in detail the above noted topics. We paused at intervals to allow for questions to be asked and answered to ensure understanding.We discussed that the single most powerful action that he can take to decrease his risk of developing lung cancer is to quit smoking. We discussed whether or not he is ready to commit to setting a quit date. We discussed options for tools to aid in quitting smoking including nicotine replacement therapy, non-nicotine medications, support groups, Quit Smart classes, and behavior modification. We discussed that often times setting smaller, more achievable goals, such as eliminating 1 cigarette a day for a week and then 2 cigarettes a day for a week can be helpful in slowly decreasing the number of cigarettes smoked. This allows for a sense of accomplishment as well as providing a clinical benefit. I provided  him  with smoking cessation  information  with contact information for community resources, classes, free nicotine replacement therapy, and access to mobile apps, text messaging, and on-line smoking cessation help. I have also provided  him  the office contact information in the event h  needs to contact  me, or the screening staff. We discussed the time and location of the scan, and that either Calvin Glassman RN, Calvin Prince, RN  or I will call / send a letter with the results within 24-72 hours of receiving them. The patient verbalized understanding of all of  the above and had no further questions upon leaving the office. They have my contact information in the event they have any further questions.  I spent 3-5 minutes counseling on smoking cessation and the health risks of continued tobacco abuse.  I explained to the patient that there has been a high incidence of coronary artery disease noted on these exams. I explained that this is a non-gated exam therefore degree or severity cannot be determined. This patient is on statin therapy. I have asked the patient to follow-up with their PCP regarding any incidental finding of coronary artery disease and management with diet or medication as their PCP  feels is clinically indicated. The patient verbalized understanding of the above and had no further questions upon completion of the visit.  Hx CAD/stent placement, taking cholesterol medication as prescribed. Current smoker, contemplating quitting but not quite ready.    Martyn Ehrich, NP

## 2022-05-25 NOTE — Patient Instructions (Signed)
Thank you for participating in the [place] Lung Cancer Screening Program. It was our pleasure to meet you today. We will call you with the results of your scan within the next few days. Your scan will be assigned a Lung RADS category score by the physicians reading the scans.  This Lung RADS score determines follow up scanning.  See below for description of categories, and follow up screening recommendations. We will be in touch to schedule your follow up screening annually or based on recommendations of our providers. We will fax a copy of your scan results to your Primary Care Physician, or the physician who referred you to the program, to ensure they have the results. Please call the office if you have any questions or concerns regarding your scanning experience or results.  Our office number is [phone]. Please speak with Denise Phelps, RN. , or  Denise Buckner RN, They are  our Lung Cancer Screening RN.'s If They are unavailable when you call, Please leave a message on the voice mail. We will return your call at our earliest convenience.This voice mail is monitored several times a day.  Remember, if your scan is normal, we will scan you annually as long as you continue to meet the criteria for the program. (Age 50-80, Current smoker or smoker who has quit within the last 15 years). If you are a smoker, remember, quitting is the single most powerful action that you can take to decrease your risk of lung cancer and other pulmonary, breathing related problems. We know quitting is hard, and we are here to help.  Please let us know if there is anything we can do to help you meet your goal of quitting. If you are a former smoker, congratulations. We are proud of you! Remain smoke free! Remember you can refer friends or family members through the number above.  We will screen them to make sure they meet criteria for the program. Thank you for helping us take better care of you by  participating in Lung Screening.  You can receive free nicotine replacement therapy ( patches, gum or mints) by calling 1-800-QUIT NOW. Please call so we can get you on the path to becoming  a non-smoker. I know it is hard, but you can do this!  Lung RADS Categories:  Lung RADS 1: no nodules or definitely non-concerning nodules.  Recommendation is for a repeat annual scan in 12 months.  Lung RADS 2:  nodules that are non-concerning in appearance and behavior with a very low likelihood of becoming an active cancer. Recommendation is for a repeat annual scan in 12 months.  Lung RADS 3: nodules that are probably non-concerning , includes nodules with a low likelihood of becoming an active cancer.  Recommendation is for a 6-month repeat screening scan. Often noted after an upper respiratory illness. We will be in touch to make sure you have no questions, and to schedule your 6-month scan.  Lung RADS 4 A: nodules with concerning findings, recommendation is most often for a follow up scan in 3 months or additional testing based on our provider's assessment of the scan. We will be in touch to make sure you have no questions and to schedule the recommended 3 month follow up scan.  Lung RADS 4 B:  indicates findings that are concerning. We will be in touch with you to schedule additional diagnostic testing based on our provider's  assessment of the scan.  Other options for assistance in smoking cessation (   As covered by your insurance benefits)  Hypnosis for smoking cessation  [employer] [phone]  Acupuncture for smoking cessation  [facility] [phone]   

## 2022-05-26 ENCOUNTER — Ambulatory Visit (INDEPENDENT_AMBULATORY_CARE_PROVIDER_SITE_OTHER): Payer: Medicaid Other | Admitting: Primary Care

## 2022-05-26 ENCOUNTER — Encounter: Payer: Self-pay | Admitting: Primary Care

## 2022-05-26 ENCOUNTER — Ambulatory Visit: Admission: RE | Admit: 2022-05-26 | Payer: Medicaid Other | Source: Ambulatory Visit

## 2022-05-26 DIAGNOSIS — F17200 Nicotine dependence, unspecified, uncomplicated: Secondary | ICD-10-CM

## 2022-05-26 DIAGNOSIS — F17210 Nicotine dependence, cigarettes, uncomplicated: Secondary | ICD-10-CM

## 2022-05-29 ENCOUNTER — Ambulatory Visit
Admission: RE | Admit: 2022-05-29 | Discharge: 2022-05-29 | Disposition: A | Discharge status: Home / Self Care | Payer: Medicaid Other | Source: Ambulatory Visit | Attending: Acute Care | Admitting: Acute Care

## 2022-05-29 DIAGNOSIS — F17210 Nicotine dependence, cigarettes, uncomplicated: Secondary | ICD-10-CM | POA: Insufficient documentation

## 2022-05-29 DIAGNOSIS — Z87891 Personal history of nicotine dependence: Secondary | ICD-10-CM | POA: Diagnosis not present

## 2022-05-29 DIAGNOSIS — Z122 Encounter for screening for malignant neoplasm of respiratory organs: Secondary | ICD-10-CM | POA: Insufficient documentation

## 2022-06-01 ENCOUNTER — Other Ambulatory Visit: Payer: Self-pay

## 2022-06-01 DIAGNOSIS — Z87891 Personal history of nicotine dependence: Secondary | ICD-10-CM

## 2022-06-01 DIAGNOSIS — F17210 Nicotine dependence, cigarettes, uncomplicated: Secondary | ICD-10-CM

## 2022-06-17 ENCOUNTER — Encounter: Payer: Self-pay | Admitting: Internal Medicine

## 2022-06-22 ENCOUNTER — Other Ambulatory Visit: Payer: Self-pay | Admitting: Internal Medicine

## 2022-06-22 DIAGNOSIS — I10 Essential (primary) hypertension: Secondary | ICD-10-CM

## 2022-06-22 DIAGNOSIS — E7849 Other hyperlipidemia: Secondary | ICD-10-CM

## 2022-06-22 DIAGNOSIS — E119 Type 2 diabetes mellitus without complications: Secondary | ICD-10-CM

## 2022-06-23 NOTE — Telephone Encounter (Signed)
Requested Prescriptions  Pending Prescriptions Disp Refills   hydrOXYzine (ATARAX) 25 MG tablet [Pharmacy Med Name: HYDROXYZINE HCL '25MG'$  TABS (WHITE)] 270 tablet 0    Sig: TAKE 2 TABLETS BY MOUTH EVERY NIGHT AT BEDTIME. MAY ALSO TAKE 1/2 TO 1 TABLET TWICE DAILY AS NEEDED FOR ANXIETY     Ear, Nose, and Throat:  Antihistamines 2 Passed - 06/22/2022  7:04 AM      Passed - Cr in normal range and within 360 days    Creat  Date Value Ref Range Status  02/16/2022 1.11 0.70 - 1.30 mg/dL Final   Creatinine, Urine  Date Value Ref Range Status  08/12/2021 163 20 - 320 mg/dL Final         Passed - Valid encounter within last 12 months    Recent Outpatient Visits           4 months ago Encounter for general adult medical examination with abnormal findings   [facility] [place], Coralie Keens, NP   10 months ago Anxiety and depression   [facility] [place], Coralie Keens, NP   1 year ago Encounter for general adult medical examination with abnormal findings   [facility] [place], Coralie Keens, NP   1 year ago Chronic midline low back pain with right-sided sciatica   [facility] [place], [place], NP   1 year ago Acute midline low back pain with right-sided sciatica   [place], NP       Future Appointments             In 1 month [place], Coralie Keens, NP [facility]             atorvastatin (LIPITOR) 40 MG tablet [Pharmacy Med Name: ATORVASTATIN '40MG'$  TABLETS] 90 tablet 0    Sig: TAKE 1 TABLET(40 MG) BY MOUTH DAILY     Cardiovascular:  Antilipid - Statins Failed - 06/22/2022  7:04 AM      Failed - Lipid Panel in normal range within the last 12 months    Cholesterol, Total  Date Value Ref Range Status  12/10/2016 139 100 - 199 mg/dL Final   Cholesterol  Date Value Ref Range Status  02/16/2022 145 <200 mg/dL Final   LDL  Cholesterol (Calc)  Date Value Ref Range Status  02/16/2022 82 mg/dL (calc) Final    Comment:    Reference range: <100 . Desirable range <100 mg/dL for primary prevention;   <70 mg/dL for patients with CHD or diabetic patients  with > or = 2 CHD risk factors. Marland Kitchen LDL-C is now calculated using the Martin-Hopkins  calculation, which is a validated novel method providing  better accuracy than the Friedewald equation in the  estimation of LDL-C.  Cresenciano Genre et al. Annamaria Helling. WG:[id number]): 2061-2068  (http://education.[employer].com/faq/FAQ164)    HDL  Date Value Ref Range Status  02/16/2022 40 > OR = 40 mg/dL Final  12/10/2016 22 (L) >39 mg/dL Final   Triglycerides  Date Value Ref Range Status  02/16/2022 135 <150 mg/dL Final         Passed - Patient is not pregnant      Passed - Valid encounter within last 12 months    Recent Outpatient Visits           4 months ago Encounter for general adult medical examination with abnormal findings  [facility] [place], Coralie Keens, NP   10 months ago Anxiety and depression   [facility] [place], [place], NP   1 year ago Encounter for general adult medical examination with abnormal findings   [facility] [place], Coralie Keens, NP   1 year ago Chronic midline low back pain with right-sided sciatica   [facility] [place], Coralie Keens, NP   1 year ago Acute midline low back pain with right-sided sciatica   [facility] [place], Coralie Keens, NP       Future Appointments             In 1 month [place], Coralie Keens, NP [facility]             benazepril (LOTENSIN) 40 MG tablet [Pharmacy Med Name: BENAZEPRIL '40MG'$  TABLETS] 90 tablet 0    Sig: TAKE 1 TABLET(40 MG) BY MOUTH DAILY     Cardiovascular:  ACE Inhibitors Passed - 06/22/2022  7:04 AM      Passed - Cr in normal range and within 180  days    Creat  Date Value Ref Range Status  02/16/2022 1.11 0.70 - 1.30 mg/dL Final   Creatinine, Urine  Date Value Ref Range Status  08/12/2021 163 20 - 320 mg/dL Final         Passed - K in normal range and within 180 days    Potassium  Date Value Ref Range Status  02/16/2022 4.2 3.5 - 5.3 mmol/L Final  02/03/2012 3.5 3.5 - 5.1 mmol/L Final         Passed - Patient is not pregnant      Passed - Last BP in normal range    BP Readings from Last 1 Encounters:  02/16/22 126/84         Passed - Valid encounter within last 6 months    Recent Outpatient Visits           4 months ago Encounter for general adult medical examination with abnormal findings   [facility] [place], Coralie Keens, NP   10 months ago Anxiety and depression   [facility] [place], Coralie Keens, NP   1 year ago Encounter for general adult medical examination with abnormal findings   [facility] [place], Coralie Keens, NP   1 year ago Chronic midline low back pain with right-sided sciatica   [facility] [place], [place], NP   1 year ago Acute midline low back pain with right-sided sciatica   [facility] [place], Coralie Keens, NP       Future Appointments             In 1 month [place], Coralie Keens, NP [facility], [facility]

## 2022-07-13 ENCOUNTER — Ambulatory Visit: Payer: Self-pay | Admitting: *Deleted

## 2022-07-13 NOTE — Telephone Encounter (Signed)
  Chief Complaint: twisted left ankle stepped in a hole when mowing yard. Symptoms: left ankle swelling bruising, top of foot looks like broken blood vessel. "Knot" noted to inside of left foot. Limping can bear weight but painful. Pain kept up all night. Using ice packs, compression, and medication. Elevating left foot since Saturday  Frequency: 07/11/22 Pertinent Negatives: Patient denies severe pain not unable to walk on foot Disposition: [] ED /[] Urgent Care (no appt availability in office) / [x] Appointment(In office/virtual)/ []  [facility]/ [] Home Care/ [] Refused Recommended Disposition /[] [place] [facility]/ []  Follow-up with PCP Additional Notes:   Appt scheduled for tomorrow. Recommended if sx worsen go to ED.    Reason for Disposition  [1] Limp when walking AND [2] due to a twisted ankle or foot  Answer Assessment - Initial Assessment Questions 1. MECHANISM: "How did the injury happen?" (e.g., twisting injury, direct blow)      Mowing yard and stepped in a "hole" twisted left ankle  2. ONSET: "When did the injury happen?" (Minutes or hours ago)      Saturday 07/11/22 3. LOCATION: "Where is the injury located?"      Inside left foot and ankle  4. APPEARANCE of INJURY: "What does the injury look like?"      Swollen , bruised  5. WEIGHT-BEARING: "Can you put weight on that foot?" "Can you walk (four steps or more)?"       Not much . Uses cane 6. SIZE: For cuts, bruises, or swelling, ask: "How large is it?" (e.g., inches or centimeters;  entire joint)      2 x more swollen than right ankle 7. PAIN: "Is there pain?" If Yes, ask: "How bad is the pain?"    (e.g., Scale 1-10; or mild, moderate, severe)   - NONE (0): no pain.   - MILD (1-3): doesn't interfere with normal activities.    - MODERATE (4-7): interferes with normal activities (e.g., work or school) or awakens from sleep, limping.    - SEVERE (8-10): excruciating pain, unable to do any normal activities, unable  to walk.      Moderate unable to sleep 4-5 level 8. TETANUS: For any breaks in the skin, ask: "When was the last tetanus booster?"     na 9. OTHER SYMPTOMS: "Do you have any other symptoms?"      Bruising broken blood vessel top of foot left foot  inside left foot looks like a "knot". 10. PREGNANCY: "Is there any chance you are pregnant?" "When was your last menstrual period?"       na  Protocols used: Ankle and Foot Injury-A-AH

## 2022-07-14 ENCOUNTER — Ambulatory Visit: Payer: Medicaid Other | Admitting: Internal Medicine

## 2022-07-14 ENCOUNTER — Ambulatory Visit
Admission: RE | Admit: 2022-07-14 | Discharge: 2022-07-14 | Disposition: A | Discharge status: Home / Self Care | Payer: Medicaid Other | Source: Ambulatory Visit | Attending: Internal Medicine | Admitting: Internal Medicine

## 2022-07-14 ENCOUNTER — Ambulatory Visit
Admission: RE | Admit: 2022-07-14 | Discharge: 2022-07-14 | Disposition: A | Discharge status: Home / Self Care | Payer: Medicaid Other | Attending: Internal Medicine | Admitting: Internal Medicine

## 2022-07-14 ENCOUNTER — Encounter: Payer: Self-pay | Admitting: Internal Medicine

## 2022-07-14 VITALS — BP 124/80 | HR 66 | Temp 96.9°F | Wt 194.0 lb

## 2022-07-14 DIAGNOSIS — M7989 Other specified soft tissue disorders: Secondary | ICD-10-CM | POA: Insufficient documentation

## 2022-07-14 DIAGNOSIS — S9032XA Contusion of left foot, initial encounter: Secondary | ICD-10-CM | POA: Insufficient documentation

## 2022-07-14 DIAGNOSIS — M79672 Pain in left foot: Secondary | ICD-10-CM | POA: Diagnosis not present

## 2022-07-14 MED ORDER — NAPROXEN 500 MG PO TABS: 500.0000 mg | ORAL_TABLET | Freq: Two times a day (BID) | ORAL | 0 refills | Status: DC

## 2022-07-14 NOTE — Patient Instructions (Signed)
RICE Therapy for Routine Care of Injuries The routine care of many injuries includes rest, ice, compression, and elevation (RICE therapy). RICE therapy is often recommended for injuries to soft tissues, such as muscle strain, sprains, bruises, and overuse injuries. It can also be used for some bone injuries. Using RICE therapy can help to relieve pain and lessen swelling. Supplies needed: Ice. Plastic bag. Towel. Elastic bandage. Pillow or pillows to raise (elevate) the injured body part. How to care for your injury with RICE therapy  Rest Rest your injury. This may help with the healing process. Rest usually involves limiting your normal activities and not using the injured part of your body. Generally, you can return to your normal activities when your health care provider says it is okay and you can do them without much discomfort. If you rest the injury too much, it may not heal as well. Some injuries heal better with early movement instead of resting for too long. Talk with your health care provider about how you should limit your activities and whether you should start range-of-motion exercises for your injury. Ice Ice your injury to lessen swelling and pain. Do not apply ice directly to your skin. Put ice in a plastic bag. Place a towel between your skin and the bag. Leave the ice on for 20 minutes, 2-3 times a day. Use ice on as many days as told by your health care provider. Compression Put pressure (compression) on your injured area to control swelling, give support, and help with discomfort. Compression may be done with an elastic bandage. If an elastic bandage has been applied, follow these general tips: Use the bandage as directed by the maker of the bandage that you are using. Do not wrap the bandage too tightly. That may block (cut off) circulation in the arm or leg in the area below the bandage. If part of your body beyond the bandage becomes blue, numb, cold, swollen, or more  painful, your bandage is probably too tight. If this occurs, remove your bandage and reapply it more loosely. Remove and reapply the bandage every 3-4 hours or as told by your health care provider. See your health care provider if the bandage seems to be making your problems worse rather than better. Elevation Elevate your injured area to lessen swelling and pain. If possible, elevate your injured area at or above the level of your heart or the center of your chest. Contact a health care provider if: Your pain and swelling continue. Your symptoms are getting worse rather than improving. Having these problems may mean that you need further evaluation or imaging tests, such as X-rays or an MRI. Sometimes, X-rays may not show a small broken bone (fracture) until days after the injury happened. Make a follow-up appointment with your health care provider. Ask your health care provider, or the department that is doing the imaging test, when your results will be ready. Get help right away if: You have sudden severe pain at or below the area of your injury. You have redness or increased swelling around your injury. You have tingling or numbness at or below the area of your injury and it does not improve after you remove the elastic bandage. Summary The routine care of many injuries includes rest, ice, compression, and elevation (RICE therapy). Using RICE therapy can help to relieve pain and lessen swelling. RICE therapy is often recommended for injuries to soft tissues, such as muscle strain, sprains, bruises, and overuse injuries. It can also   be used for some bone injuries. Seek medical care if your pain and swelling continue or if your symptoms are getting worse rather than improving. This information is not intended to replace advice given to you by your health care provider. Make sure you discuss any questions you have with your health care provider. Document Revised: 06/12/2019 Document Reviewed:  12/25/2016 Elsevier Patient Education  2021 Elsevier Inc.  

## 2022-07-14 NOTE — Progress Notes (Signed)
Subjective:    Patient ID: Calvin Ortiz, male    DOB: 1971-04-12, 52 y.o.   MRN: [id number]  HPI  Patient presents to clinic today with complaint of left ankle pain and swelling.  This occurred 3 days ago after he stepped in a hole while mowing grass.  He reports bruising and swelling to the left ankle and foot.  He is able to bear weight but it is painful.  He has been trying to keep it elevated, icing, Tylenol and wrapped it with an Ace bandage.  Review of Systems     Past Medical History:  Diagnosis Date   Anxiety    Depression    Diabetes mellitus without complication (HCC)    GERD (gastroesophageal reflux disease)    Hyperlipidemia    Hypertension    Migraines    Myocardial infarction ([place])    3 Stents   Sleep apnea    Stroke [facility])     Current Outpatient Medications  Medication Sig Dispense Refill   acetaminophen (TYLENOL) 500 MG tablet Take 1,000 mg by mouth every 6 (six) hours as needed. (Patient not taking: Reported on 05/26/2022)     amLODipine (NORVASC) 10 MG tablet TAKE 1 TABLET(10 MG) BY MOUTH DAILY 90 tablet 1   aspirin EC 81 MG tablet Take 81 mg by mouth daily.     atorvastatin (LIPITOR) 40 MG tablet TAKE 1 TABLET(40 MG) BY MOUTH DAILY 90 tablet 0   benazepril (LOTENSIN) 40 MG tablet TAKE 1 TABLET(40 MG) BY MOUTH DAILY 90 tablet 0   carvedilol (COREG) 25 MG tablet TAKE 1 TABLET(25 MG) BY MOUTH TWICE DAILY WITH A MEAL 180 tablet 1   fenofibrate (TRICOR) 145 MG tablet TAKE 1 TABLET(145 MG) BY MOUTH DAILY 90 tablet 1   fexofenadine (ALLEGRA) 180 MG tablet Take 1 tablet (180 mg total) by mouth daily. (Patient not taking: Reported on 05/26/2022) 90 tablet 3   FLUoxetine (PROZAC) 40 MG capsule TAKE 1 CAPSULE(40 MG) BY MOUTH DAILY 90 capsule 0   fluticasone (FLONASE) 50 MCG/ACT nasal spray 2 sprays in each nostril daily 16 g 3   hydrOXYzine (ATARAX) 25 MG tablet TAKE 2 TABLETS BY MOUTH EVERY NIGHT AT BEDTIME. MAY ALSO TAKE 1/2 TO 1 TABLET TWICE DAILY AS NEEDED FOR  ANXIETY 270 tablet 0   isosorbide mononitrate (IMDUR) 120 MG 24 hr tablet TAKE 1 TABLET(120 MG) BY MOUTH DAILY 90 tablet 1   nitroGLYCERIN (NITROSTAT) 0.4 MG SL tablet PLACE 1 TABLET UNDER THE TONGUE EVERY 5 MINUTES AS NEEDED FOR CHEST PAIN. MAY TAKE UP TO 3 DOSES 25 tablet 1   pantoprazole (PROTONIX) 20 MG tablet TAKE 1 TABLET(20 MG) BY MOUTH DAILY 90 tablet 1   No current facility-administered medications for this visit.    Allergies  Allergen Reactions   Buprenorphine Hcl     Other reaction(s): Other (See Comments), Other (See Comments)   Morphine And Related    Sulfa Antibiotics Rash    Family History  Problem Relation Age of Onset   COPD Mother    Hypertension Mother    Hyperlipidemia Mother    Depression Mother    Heart disease Mother    COPD Father    Diabetes Father    Heart disease Father    Stroke Father    Cancer Father        pelvic mass   Lung cancer Father    Depression Father    Diabetes Paternal Grandmother     Social History  Socioeconomic History   Marital status: Single    Spouse name: Not on file   Number of children: Not on file   Years of education: Not on file   Highest education level: Not on file  Occupational History   Not on file  Tobacco Use   Smoking status: Every Day    Packs/day: 0.00    Years: 25.00    Additional pack years: 0.00    Total pack years: 0.00    Types: Cigarettes    Last attempt to quit: 04/13/2018    Years since quitting: 4.2   Smokeless tobacco: Never   Tobacco comments:    Has quit off and on about 5-6 times (09/2017)  Vaping Use   Vaping Use: Never used  Substance and Sexual Activity   Alcohol use: No   Drug use: No   Sexual activity: Not Currently  Other Topics Concern   Not on file  Social History Narrative   Not on file   Social Determinants of Health   Financial Resource Strain: Not on file  Food Insecurity: Not on file  Transportation Needs: Not on file  Physical Activity: Not on file   Stress: Not on file  Social Connections: Not on file  Intimate Partner Violence: Not on file     Constitutional: Denies fever, malaise, fatigue, headache or abrupt weight changes.  Respiratory: Denies difficulty breathing, shortness of breath, cough or sputum production.   Cardiovascular: Denies chest pain, chest tightness, palpitations or swelling in the hands or feet.  Musculoskeletal: Patient reports left ankle/foot pain and swelling, difficulty with gait.  Denies decrease in range of motion, muscle pain.  Skin: Patient reports bruising to left ankle and foot.  Denies redness, rashes, lesions or ulcercations.  Neurological: Denies numbness, tingling, or problems with balance and coordination.    No other specific complaints in a complete review of systems (except as listed in HPI above).  Objective:   Physical Exam  BP 124/80 (BP Location: Left Arm, Patient Position: Sitting, Cuff Size: Normal)   Pulse 66   Temp (!) 96.9 F (36.1 C) (Temporal)   Wt 194 lb (88 kg)   SpO2 98%   BMI 32.28 kg/m   Wt Readings from Last 3 Encounters:  02/16/22 188 lb (85.3 kg)  08/12/21 200 lb 12.8 oz (91.1 kg)  04/14/21 193 lb (87.5 kg)    General: Appears his stated age, obese, in NAD. Skin: Warm, dry and intact.  Slight bruising noted over the medial aspect of the left foot. Cardiovascular: Normal rate.  Pedal pulse 2+ on the left. Pulmonary/Chest: Normal effort and positive vesicular breath sounds. No respiratory distress. No wheezes, rales or ronchi noted.  Musculoskeletal: Normal flexion, extension and rotation to the right of the left ankle.  Painful rotation to the left of the right ankle.  1+ swelling noted over the medial aspect of the left midfoot.  Bony tenderness noted over the medial first mid metatarsal.  Limping gait as he is putting most of his weight on his left heel.  He is using a cane for assistance. Neurological: Alert and oriented.  Sensation intact to LLE.    BMET     Component Value Date/Time   NA 141 02/16/2022 1343   NA 138 12/10/2016 1902   NA 140 02/03/2012 0313   K 4.2 02/16/2022 1343   K 3.5 02/03/2012 0313   CL 112 (H) 02/16/2022 1343   CL 106 02/03/2012 0313   CO2 19 (L) 02/16/2022 1343  CO2 22 02/03/2012 0313   GLUCOSE 142 (H) 02/16/2022 1343   GLUCOSE 151 (H) 02/03/2012 0313   BUN 15 02/16/2022 1343   BUN 15 12/10/2016 1902   BUN 9 02/03/2012 0313   CREATININE 1.11 02/16/2022 1343   CALCIUM 9.0 02/16/2022 1343   CALCIUM 9.2 02/03/2012 0313   GFRNONAA >60 05/07/2020 1612   GFRNONAA 66 08/17/2019 0948   GFRAA 77 08/17/2019 0948    Lipid Panel     Component Value Date/Time   CHOL 145 02/16/2022 1343   CHOL 139 12/10/2016 1902   TRIG 135 02/16/2022 1343   HDL 40 02/16/2022 1343   HDL 22 (L) 12/10/2016 1902   CHOLHDL 3.6 02/16/2022 1343   LDLCALC 82 02/16/2022 1343    CBC    Component Value Date/Time   WBC 6.9 02/16/2022 1343   RBC 5.10 02/16/2022 1343   HGB 14.8 02/16/2022 1343   HGB 14.9 04/02/2016 1939   HCT 45.0 02/16/2022 1343   HCT 43.6 04/02/2016 1939   PLT 308 02/16/2022 1343   PLT 268 04/02/2016 1939   MCV 88.2 02/16/2022 1343   MCV 86 04/02/2016 1939   MCV 87 02/03/2012 0313   MCH 29.0 02/16/2022 1343   MCHC 32.9 02/16/2022 1343   RDW 13.1 02/16/2022 1343   RDW 13.3 04/02/2016 1939   RDW 13.0 02/03/2012 0313   LYMPHSABS 3,717 08/17/2019 0948   LYMPHSABS 3.3 (H) 04/02/2016 1939   EOSABS 351 08/17/2019 0948   EOSABS 0.3 04/02/2016 1939   BASOSABS 99 08/17/2019 0948   BASOSABS 0.1 04/02/2016 1939    Hgb A1C Lab Results  Component Value Date   HGBA1C 5.6 02/16/2022            Assessment & Plan:   Left Foot Pain, Swelling and Bruising:  Xray left foot today to r/o fracture Encouraged him to continue use of cane for assistance Continue ice, elevation and wrapping with an Ace bandage Rx for Naproxen 500 mg twice daily-consume with food Okay to continue Tylenol every 4 hours as  needed  RTC in 1 month for follow-up of chronic conditions Webb Silversmith, NP

## 2022-08-19 ENCOUNTER — Ambulatory Visit (INDEPENDENT_AMBULATORY_CARE_PROVIDER_SITE_OTHER): Payer: Medicaid Other | Admitting: Internal Medicine

## 2022-08-19 DIAGNOSIS — Z91199 Patient's noncompliance with other medical treatment and regimen due to unspecified reason: Secondary | ICD-10-CM

## 2022-08-19 NOTE — Progress Notes (Unsigned)
   Subjective:    Patient ID: Calvin Ortiz, male    DOB: 08-16-1970, 52 y.o.   MRN: [id number]  No show

## 2022-08-20 ENCOUNTER — Ambulatory Visit: Payer: Medicaid Other | Admitting: Internal Medicine

## 2022-08-21 ENCOUNTER — Other Ambulatory Visit: Payer: Self-pay | Admitting: Internal Medicine

## 2022-08-21 DIAGNOSIS — E119 Type 2 diabetes mellitus without complications: Secondary | ICD-10-CM

## 2022-08-21 NOTE — Telephone Encounter (Signed)
Rx was sent to pharmacy on 06/23/22 #270/0 RF  Requested Prescriptions  Pending Prescriptions Disp Refills   hydrOXYzine (ATARAX) 25 MG tablet [Pharmacy Med Name: HYDROXYZINE HCL 25MG  TABS (WHITE)] 270 tablet 0    Sig: TAKE 2 TABLETS BY MOUTH EVERY NIGHT AT BEDTIME; MAY ALSO TAKE 1/2 TO 1 TABLET TWICE DAILY AS NEEDED FOR ANXIETY     Ear, Nose, and Throat:  Antihistamines 2 Passed - 08/21/2022  7:04 AM      Passed - Cr in normal range and within 360 days    Creat  Date Value Ref Range Status  02/16/2022 1.11 0.70 - 1.30 mg/dL Final   Creatinine, Urine  Date Value Ref Range Status  08/12/2021 163 20 - 320 mg/dL Final         Passed - Valid encounter within last 12 months    Recent Outpatient Visits           2 days ago No-show for appointment   [place] [facility] [place], Salvadore Oxford, NP   1 month ago Left foot pain   [place] [facility] [place], Salvadore Oxford, NP   6 months ago Encounter for general adult medical examination with abnormal findings   [place] [facility] [place], Salvadore Oxford, NP   1 year ago Anxiety and depression   [place] [facility] [place], Salvadore Oxford, NP   1 year ago Encounter for general adult medical examination with abnormal findings   [place] [facility] [place], Salvadore Oxford, NP

## 2022-09-16 ENCOUNTER — Other Ambulatory Visit: Payer: Self-pay | Admitting: Internal Medicine

## 2022-09-16 DIAGNOSIS — K219 Gastro-esophageal reflux disease without esophagitis: Secondary | ICD-10-CM

## 2022-09-17 NOTE — Telephone Encounter (Signed)
Requested Prescriptions  Pending Prescriptions Disp Refills   FLUoxetine (PROZAC) 40 MG capsule [Pharmacy Med Name: FLUOXETINE 40MG  CAPSULES] 90 capsule 0    Sig: TAKE 1 CAPSULE(40 MG) BY MOUTH DAILY     Psychiatry:  Antidepressants - SSRI Failed - 09/16/2022  3:56 PM      Failed - Valid encounter within last 6 months    Recent Outpatient Visits           4 weeks ago No-show for appointment   [place] [facility] [place], Salvadore Oxford, NP   2 months ago Left foot pain   [place] [facility] [place], Salvadore Oxford, NP   7 months ago Encounter for general adult medical examination with abnormal findings   [place] [facility] [place], Salvadore Oxford, NP   1 year ago Anxiety and depression   [place] [place], Salvadore Oxford, NP   1 year ago Encounter for general adult medical examination with abnormal findings   [place] [facility] [place], Salvadore Oxford, NP               pantoprazole (PROTONIX) 20 MG tablet [Pharmacy Med Name: PANTOPRAZOLE 20MG  TABLETS] 90 tablet 0    Sig: TAKE 1 TABLET(20 MG) BY MOUTH DAILY     Gastroenterology: Proton Pump Inhibitors Passed - 09/16/2022  3:56 PM      Passed - Valid encounter within last 12 months    Recent Outpatient Visits           4 weeks ago No-show for appointment   [place] [facility] [place], Salvadore Oxford, NP   2 months ago Left foot pain   [place] [facility] [place], Salvadore Oxford, NP   7 months ago Encounter for general adult medical examination with abnormal findings   [place] [facility] [place], Salvadore Oxford, NP   1 year ago Anxiety and depression   [place] [place], Salvadore Oxford, NP   1 year ago Encounter for general adult medical examination with abnormal findings   [place] [facility] [place], Salvadore Oxford, NP

## 2022-09-28 ENCOUNTER — Other Ambulatory Visit: Payer: Self-pay | Admitting: Internal Medicine

## 2022-09-28 DIAGNOSIS — I10 Essential (primary) hypertension: Secondary | ICD-10-CM

## 2022-09-28 DIAGNOSIS — E7849 Other hyperlipidemia: Secondary | ICD-10-CM

## 2022-09-29 NOTE — Telephone Encounter (Signed)
Requested Prescriptions  Pending Prescriptions Disp Refills   atorvastatin (LIPITOR) 40 MG tablet [Pharmacy Med Name: ATORVASTATIN 40MG  TABLETS] 90 tablet 0    Sig: TAKE 1 TABLET(40 MG) BY MOUTH DAILY     Cardiovascular:  Antilipid - Statins Failed - 09/28/2022  7:52 PM      Failed - Lipid Panel in normal range within the last 12 months    Cholesterol, Total  Date Value Ref Range Status  12/10/2016 139 100 - 199 mg/dL Final   Cholesterol  Date Value Ref Range Status  02/16/2022 145 <200 mg/dL Final   LDL Cholesterol (Calc)  Date Value Ref Range Status  02/16/2022 82 mg/dL (calc) Final    Comment:    Reference range: <100 . Desirable range <100 mg/dL for primary prevention;   <70 mg/dL for patients with CHD or diabetic patients  with > or = 2 CHD risk factors. Marland Kitchen LDL-C is now calculated using the Martin-Hopkins  calculation, which is a validated novel method providing  better accuracy than the Friedewald equation in the  estimation of LDL-C.  Horald Pollen et al. Lenox Ahr. 4098;119(14): 2061-2068  (http://education.[employer].com/faq/FAQ164)    HDL  Date Value Ref Range Status  02/16/2022 40 > OR = 40 mg/dL Final  78/29/5621 22 (L) >39 mg/dL Final   Triglycerides  Date Value Ref Range Status  02/16/2022 135 <150 mg/dL Final         Passed - Patient is not pregnant      Passed - Valid encounter within last 12 months    Recent Outpatient Visits           1 month ago No-show for appointment   [place] [facility] [place], Salvadore Oxford, NP   2 months ago Left foot pain   [place] [place], [place], NP   7 months ago Encounter for general adult medical examination with abnormal findings   [place] [facility] [place], Salvadore Oxford, NP   1 year ago Anxiety and depression   [place] [facility] [place], [place], NP   1 year ago Encounter for general adult medical examination with abnormal findings    [place] [facility] [place], Salvadore Oxford, NP               benazepril (LOTENSIN) 40 MG tablet [Pharmacy Med Name: BENAZEPRIL 40MG  TABLETS] 90 tablet 0    Sig: TAKE 1 TABLET(40 MG) BY MOUTH DAILY     Cardiovascular:  ACE Inhibitors Failed - 09/28/2022  7:52 PM      Failed - Cr in normal range and within 180 days    Creat  Date Value Ref Range Status  02/16/2022 1.11 0.70 - 1.30 mg/dL Final   Creatinine, Urine  Date Value Ref Range Status  08/12/2021 163 20 - 320 mg/dL Final         Failed - K in normal range and within 180 days    Potassium  Date Value Ref Range Status  02/16/2022 4.2 3.5 - 5.3 mmol/L Final  02/03/2012 3.5 3.5 - 5.1 mmol/L Final         Failed - Valid encounter within last 6 months    Recent Outpatient Visits           1 month ago No-show for appointment   [place] [facility] [place], Salvadore Oxford, NP   2 months ago Left foot pain   [place]  [facility] [place], Salvadore Oxford, NP   7 months ago Encounter for general adult medical examination with abnormal findings   [place] [facility] [place], Salvadore Oxford, NP   1 year ago Anxiety and depression   [place] [facility] [place], Salvadore Oxford, NP   1 year ago Encounter for general adult medical examination with abnormal findings   [place] [facility] [place], Salvadore Oxford, [place]              Passed - Patient is not pregnant      Passed - Last BP in normal range    BP Readings from Last 1 Encounters:  07/14/22 124/80

## 2022-10-13 ENCOUNTER — Other Ambulatory Visit: Payer: Self-pay | Admitting: Internal Medicine

## 2022-10-13 DIAGNOSIS — I252 Old myocardial infarction: Secondary | ICD-10-CM

## 2022-10-13 DIAGNOSIS — E119 Type 2 diabetes mellitus without complications: Secondary | ICD-10-CM

## 2022-10-13 DIAGNOSIS — I10 Essential (primary) hypertension: Secondary | ICD-10-CM

## 2022-10-14 NOTE — Telephone Encounter (Signed)
Requested Prescriptions  Pending Prescriptions Disp Refills   carvedilol (COREG) 25 MG tablet [Pharmacy Med Name: CARVEDILOL 25MG  TABLETS] 180 tablet 1    Sig: TAKE 1 TABLET(25 MG) BY MOUTH TWICE DAILY WITH A MEAL     Cardiovascular: Beta Blockers 3 Failed - 10/13/2022 10:16 PM      Failed - Valid encounter within last 6 months    Recent Outpatient Visits           1 month ago No-show for appointment   [facility] [place], Salvadore Oxford, NP   3 months ago Left foot pain   [place] [place], Salvadore Oxford, NP   8 months ago Encounter for general adult medical examination with abnormal findings   [place] [facility] [place], Salvadore Oxford, NP   1 year ago Anxiety and depression   [place] [facility] [place], [place], NP   1 year ago Encounter for general adult medical examination with abnormal findings   [place] [facility] [place], Salvadore Oxford, NP              Passed - Cr in normal range and within 360 days    Creat  Date Value Ref Range Status  02/16/2022 1.11 0.70 - 1.30 mg/dL Final   Creatinine, Urine  Date Value Ref Range Status  08/12/2021 163 20 - 320 mg/dL Final         Passed - AST in normal range and within 360 days    AST  Date Value Ref Range Status  02/16/2022 15 10 - 35 U/L Final   SGOT(AST)  Date Value Ref Range Status  02/03/2012 35 15 - 37 Unit/L Final         Passed - ALT in normal range and within 360 days    ALT  Date Value Ref Range Status  02/16/2022 14 9 - 46 U/L Final   SGPT (ALT)  Date Value Ref Range Status  02/03/2012 35 12 - 78 U/L Final         Passed - Last BP in normal range    BP Readings from Last 1 Encounters:  07/14/22 124/80         Passed - Last Heart Rate in normal range    Pulse Readings from Last 1 Encounters:  07/14/22 66          hydrOXYzine (ATARAX) 25 MG tablet [Pharmacy Med Name: HYDROXYZINE HCL 25MG  TABS (WHITE)] 270  tablet 0    Sig: TAKE 2 TABLETS BY MOUTH EVERY NIGHT AT BEDTIME. MAY ALSO TAKE 1/2- 1 TABLET 2 TIMES DAILY AS NEEDED FOR ANXIETY     Ear, Nose, and Throat:  Antihistamines 2 Passed - 10/13/2022 10:16 PM      Passed - Cr in normal range and within 360 days    Creat  Date Value Ref Range Status  02/16/2022 1.11 0.70 - 1.30 mg/dL Final   Creatinine, Urine  Date Value Ref Range Status  08/12/2021 163 20 - 320 mg/dL Final         Passed - Valid encounter within last 12 months    Recent Outpatient Visits           1 month ago No-show for appointment   [facility] [place], NP   3 months ago Left foot pain    [place] [facility] [place], Salvadore Oxford, [place]  8 months ago Encounter for general adult medical examination with abnormal findings   [facility] [place], Salvadore Oxford, NP   1 year ago Anxiety and depression   [place] [place], Salvadore Oxford, NP   1 year ago Encounter for general adult medical examination with abnormal findings   [place] [place], Salvadore Oxford, NP

## 2022-10-14 NOTE — Telephone Encounter (Signed)
Requested medication (s) are due for refill today: yes  Requested medication (s) are on the active medication list: yes    Last refill: 04/10/22  #180  1 refill  Future visit scheduled no  Notes to clinic:Pt needs appt, last appt no show.  Attempted to reach to secure appt, left VM to call back.  Requested Prescriptions  Pending Prescriptions Disp Refills   carvedilol (COREG) 25 MG tablet [Pharmacy Med Name: CARVEDILOL 25MG  TABLETS] 180 tablet 1    Sig: TAKE 1 TABLET(25 MG) BY MOUTH TWICE DAILY WITH A MEAL     Cardiovascular: Beta Blockers 3 Failed - 10/13/2022 10:16 PM      Failed - Valid encounter within last 6 months    Recent Outpatient Visits           1 month ago No-show for appointment   [place] [facility] [place], Salvadore Oxford, NP   3 months ago Left foot pain   [place] [facility] [place], Salvadore Oxford, NP   8 months ago Encounter for general adult medical examination with abnormal findings   [place] [place], Salvadore Oxford, NP   1 year ago Anxiety and depression   [place] [facility] [place], [place], NP   1 year ago Encounter for general adult medical examination with abnormal findings   [place] [facility] [place], Salvadore Oxford, NP              Passed - Cr in normal range and within 360 days    Creat  Date Value Ref Range Status  02/16/2022 1.11 0.70 - 1.30 mg/dL Final   Creatinine, Urine  Date Value Ref Range Status  08/12/2021 163 20 - 320 mg/dL Final         Passed - AST in normal range and within 360 days    AST  Date Value Ref Range Status  02/16/2022 15 10 - 35 U/L Final   SGOT(AST)  Date Value Ref Range Status  02/03/2012 35 15 - 37 Unit/L Final         Passed - ALT in normal range and within 360 days    ALT  Date Value Ref Range Status  02/16/2022 14 9 - 46 U/L Final   SGPT (ALT)  Date Value Ref Range Status  02/03/2012 35 12 - 78 U/L Final          Passed - Last BP in normal range    BP Readings from Last 1 Encounters:  07/14/22 124/80         Passed - Last Heart Rate in normal range    Pulse Readings from Last 1 Encounters:  07/14/22 66         Signed Prescriptions Disp Refills   hydrOXYzine (ATARAX) 25 MG tablet 270 tablet 0    Sig: TAKE 2 TABLETS BY MOUTH EVERY NIGHT AT BEDTIME. MAY ALSO TAKE 1/2- 1 TABLET 2 TIMES DAILY AS NEEDED FOR ANXIETY     Ear, Nose, and Throat:  Antihistamines 2 Passed - 10/13/2022 10:16 PM      Passed - Cr in normal range and within 360 days    Creat  Date Value Ref Range Status  02/16/2022 1.11 0.70 - 1.30 mg/dL Final   Creatinine, Urine  Date Value Ref Range Status  08/12/2021 163 20 - 320 mg/dL Final         Passed - Valid encounter within last  12 months    Recent Outpatient Visits           1 month ago No-show for appointment   [place] [facility] [place], Salvadore Oxford, NP   3 months ago Left foot pain   [place] [facility] [place], Salvadore Oxford, NP   8 months ago Encounter for general adult medical examination with abnormal findings   [place] [facility] [place], Salvadore Oxford, NP   1 year ago Anxiety and depression   [place] [facility] [place], Salvadore Oxford, NP   1 year ago Encounter for general adult medical examination with abnormal findings   [facility] [place], Salvadore Oxford, NP

## 2022-10-15 ENCOUNTER — Ambulatory Visit: Payer: Medicaid Other | Admitting: Internal Medicine

## 2022-10-15 ENCOUNTER — Encounter: Payer: Self-pay | Admitting: Internal Medicine

## 2022-10-15 VITALS — BP 124/72 | HR 60 | Temp 96.8°F | Wt 189.0 lb

## 2022-10-15 DIAGNOSIS — E119 Type 2 diabetes mellitus without complications: Secondary | ICD-10-CM

## 2022-10-15 DIAGNOSIS — E7849 Other hyperlipidemia: Secondary | ICD-10-CM | POA: Diagnosis not present

## 2022-10-15 DIAGNOSIS — F419 Anxiety disorder, unspecified: Secondary | ICD-10-CM

## 2022-10-15 DIAGNOSIS — F32A Depression, unspecified: Secondary | ICD-10-CM | POA: Diagnosis not present

## 2022-10-15 DIAGNOSIS — I252 Old myocardial infarction: Secondary | ICD-10-CM | POA: Diagnosis not present

## 2022-10-15 DIAGNOSIS — I2510 Atherosclerotic heart disease of native coronary artery without angina pectoris: Secondary | ICD-10-CM

## 2022-10-15 DIAGNOSIS — F331 Major depressive disorder, recurrent, moderate: Secondary | ICD-10-CM | POA: Insufficient documentation

## 2022-10-15 DIAGNOSIS — E66811 Obesity, class 1: Secondary | ICD-10-CM

## 2022-10-15 DIAGNOSIS — Z6831 Body mass index (BMI) 31.0-31.9, adult: Secondary | ICD-10-CM

## 2022-10-15 DIAGNOSIS — E1169 Type 2 diabetes mellitus with other specified complication: Secondary | ICD-10-CM

## 2022-10-15 DIAGNOSIS — I10 Essential (primary) hypertension: Secondary | ICD-10-CM | POA: Diagnosis not present

## 2022-10-15 DIAGNOSIS — K219 Gastro-esophageal reflux disease without esophagitis: Secondary | ICD-10-CM

## 2022-10-15 DIAGNOSIS — E785 Hyperlipidemia, unspecified: Secondary | ICD-10-CM

## 2022-10-15 DIAGNOSIS — E6609 Other obesity due to excess calories: Secondary | ICD-10-CM | POA: Diagnosis not present

## 2022-10-15 LAB — POCT GLYCOSYLATED HEMOGLOBIN (HGB A1C): Hemoglobin A1C: 5.7 % — AB (ref 4.0–5.6)

## 2022-10-15 MED ORDER — CARVEDILOL 25 MG PO TABS: ORAL_TABLET | ORAL | 1 refills | Status: DC

## 2022-10-15 MED ORDER — BUPROPION HCL ER (XL) 150 MG PO TB24: 150.0000 mg | ORAL_TABLET | Freq: Every day | ORAL | 1 refills | Status: DC

## 2022-10-15 MED ORDER — FLUOXETINE HCL 40 MG PO CAPS: ORAL_CAPSULE | ORAL | 1 refills | Status: DC

## 2022-10-15 MED ORDER — HYDROXYZINE HCL 25 MG PO TABS: ORAL_TABLET | ORAL | 1 refills | Status: DC

## 2022-10-15 MED ORDER — AMLODIPINE BESYLATE 10 MG PO TABS: ORAL_TABLET | ORAL | 1 refills | Status: DC

## 2022-10-15 MED ORDER — ISOSORBIDE MONONITRATE ER 120 MG PO TB24: ORAL_TABLET | ORAL | 1 refills | Status: DC

## 2022-10-15 MED ORDER — PANTOPRAZOLE SODIUM 20 MG PO TBEC: DELAYED_RELEASE_TABLET | ORAL | 0 refills | Status: DC

## 2022-10-15 MED ORDER — FENOFIBRATE 145 MG PO TABS: ORAL_TABLET | ORAL | 1 refills | Status: DC

## 2022-10-15 MED ORDER — ATORVASTATIN CALCIUM 40 MG PO TABS: ORAL_TABLET | ORAL | 1 refills | Status: DC

## 2022-10-15 MED ORDER — BENAZEPRIL HCL 40 MG PO TABS: ORAL_TABLET | ORAL | 1 refills | Status: DC

## 2022-10-15 NOTE — Assessment & Plan Note (Signed)
Avoid foods that trigger reflux Encouraged also as this can help reduce reflux symptoms Continue pantoprazole

## 2022-10-15 NOTE — Patient Instructions (Signed)

## 2022-10-15 NOTE — Assessment & Plan Note (Signed)
POCT A1c 5.7% We will check urine microalbumin No meds Encourage low-carb diet and exercise for weight loss Encouraged her to exam Encouraged routine eye exam Immunizations UTD

## 2022-10-15 NOTE — Assessment & Plan Note (Signed)
Encourage diet and exercise for weight loss 

## 2022-10-15 NOTE — Assessment & Plan Note (Signed)
Deteriorated Continue fluoxetine and hydroxyzine Will add bupropion 150 mg XL daily Support offered

## 2022-10-15 NOTE — Assessment & Plan Note (Signed)
Encourage low-fat diet C-Met and lipid profile today Continue atorvastatin, fenofibrate

## 2022-10-15 NOTE — Progress Notes (Signed)
Subjective:    Patient ID: Calvin Ortiz, male    DOB: 07-08-1970, 52 y.o.   MRN: [id number]  HPI  Patient presents to clinic today for follow-up of chronic conditions.  DM2: His last A1c was 5.6%, 01/2022.  He is not taking any oral diabetic medication at this time.  He is on benazepril for renal protection.  He does not check his sugars.  He checks his feet routinely.  His last eye exam was 05/2021.  Flu 01/2022.  Pneumovax 07/2020.  COVID Moderna x 3.  HLD with CAD status post MI: His last LDL was 82, triglycerides 643, 01/2022.  He denies myalgias on atorvastatin.  He is taking fenofibrate, isosorbide, carvedilol and aspirin as well.  He does not follow with cardiology.  HTN: His BP today is 124/72.  He is taking amlodipine, benazepril and carvedilol as prescribed.  ECG from 04/2020 reviewed.  GERD: Triggered by tomato based sauces.  He denies breakthrough on pantoprazole.  Upper GI from 03/2021 reviewed.  Anxiety and depression: Chronic, managed on fluoxetine and hydroxyzine. He does feel like he needs a dose adjustment.  He is not currently seeing a therapist.  He denies SI/HI.   Review of Systems     Past Medical History:  Diagnosis Date   Anxiety    Depression    Diabetes mellitus without complication (HCC)    GERD (gastroesophageal reflux disease)    Hyperlipidemia    Hypertension    Migraines    Myocardial infarction (HCC)    3 Stents   Sleep apnea    Stroke [facility] [place]     Current Outpatient Medications  Medication Sig Dispense Refill   acetaminophen (TYLENOL) 500 MG tablet Take 1,000 mg by mouth every 6 (six) hours as needed.     amLODipine (NORVASC) 10 MG tablet TAKE 1 TABLET(10 MG) BY MOUTH DAILY 90 tablet 1   aspirin EC 81 MG tablet Take 81 mg by mouth daily.     atorvastatin (LIPITOR) 40 MG tablet TAKE 1 TABLET(40 MG) BY MOUTH DAILY 90 tablet 0   benazepril (LOTENSIN) 40 MG tablet TAKE 1 TABLET(40 MG) BY MOUTH DAILY 90 tablet 0   carvedilol (COREG) 25 MG  tablet TAKE 1 TABLET(25 MG) BY MOUTH TWICE DAILY WITH A MEAL 180 tablet 1   fenofibrate (TRICOR) 145 MG tablet TAKE 1 TABLET(145 MG) BY MOUTH DAILY 90 tablet 1   fexofenadine (ALLEGRA) 180 MG tablet Take 1 tablet (180 mg total) by mouth daily. 90 tablet 3   FLUoxetine (PROZAC) 40 MG capsule TAKE 1 CAPSULE(40 MG) BY MOUTH DAILY 90 capsule 0   fluticasone (FLONASE) 50 MCG/ACT nasal spray 2 sprays in each nostril daily 16 g 3   hydrOXYzine (ATARAX) 25 MG tablet TAKE 2 TABLETS BY MOUTH EVERY NIGHT AT BEDTIME. MAY ALSO TAKE 1/2- 1 TABLET 2 TIMES DAILY AS NEEDED FOR ANXIETY 270 tablet 0   isosorbide mononitrate (IMDUR) 120 MG 24 hr tablet TAKE 1 TABLET(120 MG) BY MOUTH DAILY 90 tablet 1   naproxen (NAPROSYN) 500 MG tablet Take 1 tablet (500 mg total) by mouth 2 (two) times daily with a meal. For 2-4 weeks then as needed 30 tablet 0   nitroGLYCERIN (NITROSTAT) 0.4 MG SL tablet PLACE 1 TABLET UNDER THE TONGUE EVERY 5 MINUTES AS NEEDED FOR CHEST PAIN. MAY TAKE UP TO 3 DOSES 25 tablet 1   pantoprazole (PROTONIX) 20 MG tablet TAKE 1 TABLET(20 MG) BY MOUTH DAILY 90 tablet 0   No current facility-administered  medications for this visit.    Allergies  Allergen Reactions   Buprenorphine Hcl     Other reaction(s): Other (See Comments), Other (See Comments)   Morphine And Codeine    Sulfa Antibiotics Rash    Family History  Problem Relation Age of Onset   COPD Mother    Hypertension Mother    Hyperlipidemia Mother    Depression Mother    Heart disease Mother    COPD Father    Diabetes Father    Heart disease Father    Stroke Father    Cancer Father        pelvic mass   Lung cancer Father    Depression Father    Diabetes Paternal Grandmother     Social History   Socioeconomic History   Marital status: Single    Spouse name: Not on file   Number of children: Not on file   Years of education: Not on file   Highest education level: Not on file  Occupational History   Not on file  Tobacco  Use   Smoking status: Every Day    Packs/day: 0.00    Years: 25.00    Additional pack years: 0.00    Total pack years: 0.00    Types: Cigarettes    Last attempt to quit: 04/13/2018    Years since quitting: 4.5   Smokeless tobacco: Never   Tobacco comments:    Has quit off and on about 5-6 times (09/2017)  Vaping Use   Vaping Use: Never used  Substance and Sexual Activity   Alcohol use: No   Drug use: No   Sexual activity: Not Currently  Other Topics Concern   Not on file  Social History Narrative   Not on file   Social Determinants of Health   Financial Resource Strain: Not on file  Food Insecurity: Not on file  Transportation Needs: Not on file  Physical Activity: Not on file  Stress: Not on file  Social Connections: Not on file  Intimate Partner Violence: Not on file     Constitutional: Denies fever, malaise, fatigue, headache or abrupt weight changes.  HEENT: Denies eye pain, eye redness, ear pain, ringing in the ears, wax buildup, runny nose, nasal congestion, bloody nose, or sore throat. Respiratory: Denies difficulty breathing, shortness of breath, cough or sputum production.   Cardiovascular: Denies chest pain, chest tightness, palpitations or swelling in the hands or feet.  Gastrointestinal: Denies abdominal pain, bloating, constipation, diarrhea or blood in the stool.  GU: Denies urgency, frequency, pain with urination, burning sensation, blood in urine, odor or discharge. Musculoskeletal: Denies decrease in range of motion, difficulty with gait, muscle pain or joint pain and swelling.  Skin: Denies redness, rashes, lesions or ulcercations.  Neurological: Denies dizziness, difficulty with memory, difficulty with speech or problems with balance and coordination.  Psych: Patient has a history of anxiety and depression.  Denies SI/HI.  No other specific complaints in a complete review of systems (except as listed in HPI above).  Objective:   Physical Exam  BP  124/72 (BP Location: Left Arm, Patient Position: Sitting, Cuff Size: Normal)   Pulse 60   Temp (!) 96.8 F (36 C) (Temporal)   Wt 189 lb (85.7 kg)   SpO2 97%   BMI 31.45 kg/m   Wt Readings from Last 3 Encounters:  07/14/22 194 lb (88 kg)  02/16/22 188 lb (85.3 kg)  08/12/21 200 lb 12.8 oz (91.1 kg)    General: Appears  his stated age, obese, in NAD. Skin: Warm, dry and intact. No ulcerations noted. HEENT: Head: normal shape and size; Eyes: sclera white, no icterus, conjunctiva pink, PERRLA and EOMs intact;  Cardiovascular: Normal rate and rhythm. S1,S2 noted.  No murmur, rubs or gallops noted. No JVD or BLE edema. No carotid bruits noted. Pulmonary/Chest: Normal effort and positive vesicular breath sounds. No respiratory distress. No wheezes, rales or ronchi noted.  Abdomen: Soft and nontender. Normal bowel sounds.  Musculoskeletal:  No difficulty with gait.  Neurological: Alert and oriented. Coordination normal.  Psychiatric: Mood and affect normal. Behavior is normal. Judgment and thought content normal.    BMET    Component Value Date/Time   NA 141 02/16/2022 1343   NA 138 12/10/2016 1902   NA 140 02/03/2012 0313   K 4.2 02/16/2022 1343   K 3.5 02/03/2012 0313   CL 112 (H) 02/16/2022 1343   CL 106 02/03/2012 0313   CO2 19 (L) 02/16/2022 1343   CO2 22 02/03/2012 0313   GLUCOSE 142 (H) 02/16/2022 1343   GLUCOSE 151 (H) 02/03/2012 0313   BUN 15 02/16/2022 1343   BUN 15 12/10/2016 1902   BUN 9 02/03/2012 0313   CREATININE 1.11 02/16/2022 1343   CALCIUM 9.0 02/16/2022 1343   CALCIUM 9.2 02/03/2012 0313   GFRNONAA >60 05/07/2020 1612   GFRNONAA 66 08/17/2019 0948   GFRAA 77 08/17/2019 0948    Lipid Panel     Component Value Date/Time   CHOL 145 02/16/2022 1343   CHOL 139 12/10/2016 1902   TRIG 135 02/16/2022 1343   HDL 40 02/16/2022 1343   HDL 22 (L) 12/10/2016 1902   CHOLHDL 3.6 02/16/2022 1343   LDLCALC 82 02/16/2022 1343    CBC    Component Value  Date/Time   WBC 6.9 02/16/2022 1343   RBC 5.10 02/16/2022 1343   HGB 14.8 02/16/2022 1343   HGB 14.9 04/02/2016 1939   HCT 45.0 02/16/2022 1343   HCT 43.6 04/02/2016 1939   PLT 308 02/16/2022 1343   PLT 268 04/02/2016 1939   MCV 88.2 02/16/2022 1343   MCV 86 04/02/2016 1939   MCV 87 02/03/2012 0313   MCH 29.0 02/16/2022 1343   MCHC 32.9 02/16/2022 1343   RDW 13.1 02/16/2022 1343   RDW 13.3 04/02/2016 1939   RDW 13.0 02/03/2012 0313   LYMPHSABS 3,717 08/17/2019 0948   LYMPHSABS 3.3 (H) 04/02/2016 1939   EOSABS 351 08/17/2019 0948   EOSABS 0.3 04/02/2016 1939   BASOSABS 99 08/17/2019 0948   BASOSABS 0.1 04/02/2016 1939    Hgb A1C Lab Results  Component Value Date   HGBA1C 5.6 02/16/2022           Assessment & Plan:   RTC in 4 months for your annual exam Nicki Reaper, NP

## 2022-10-15 NOTE — Assessment & Plan Note (Signed)
No angina C-Met and lipid profile today Continue atorvastatin, fenofibrate, isosorbide, carvedilol and aspirin 

## 2022-10-15 NOTE — Assessment & Plan Note (Signed)
Controlled on amlodipine-benazepril, carvedilol, refilled today Reinforced diet and exercise for weight loss C-Met today

## 2022-10-15 NOTE — Assessment & Plan Note (Signed)
No angina C-Met and lipid profile today Continue atorvastatin, fenofibrate, isosorbide, carvedilol and aspirin

## 2022-10-16 LAB — COMPLETE METABOLIC PANEL WITH GFR
AG Ratio: 1.7 (calc) (ref 1.0–2.5)
ALT: 30 U/L (ref 9–46)
AST: 19 U/L (ref 10–35)
Albumin: 4.1 g/dL (ref 3.6–5.1)
Alkaline phosphatase (APISO): 88 U/L (ref 35–144)
BUN: 13 mg/dL (ref 7–25)
CO2: 24 mmol/L (ref 20–32)
Calcium: 9.3 mg/dL (ref 8.6–10.3)
Chloride: 107 mmol/L (ref 98–110)
Creat: 1.09 mg/dL (ref 0.70–1.30)
Globulin: 2.4 g/dL (calc) (ref 1.9–3.7)
Glucose, Bld: 129 mg/dL — ABNORMAL HIGH (ref 65–99)
Potassium: 4.4 mmol/L (ref 3.5–5.3)
Sodium: 140 mmol/L (ref 135–146)
Total Bilirubin: 0.5 mg/dL (ref 0.2–1.2)
Total Protein: 6.5 g/dL (ref 6.1–8.1)
eGFR: 83 mL/min/{1.73_m2} (ref >=60)

## 2022-10-16 LAB — CBC
HCT: 47.2 % (ref 38.5–50.0)
Hemoglobin: 15.7 g/dL (ref 13.2–17.1)
MCH: 29.2 pg (ref 27.0–33.0)
MCHC: 33.3 g/dL (ref 32.0–36.0)
MCV: 87.7 fL (ref 80.0–100.0)
MPV: 9.9 fL (ref 7.5–12.5)
Platelets: 262 10*3/uL (ref 140–400)
RBC: 5.38 10*6/uL (ref 4.20–5.80)
RDW: 13.1 % (ref 11.0–15.0)
WBC: 8.4 10*3/uL (ref 3.8–10.8)

## 2022-10-16 LAB — LIPID PANEL
Cholesterol: 122 mg/dL (ref <200)
HDL: 32 mg/dL — ABNORMAL LOW (ref >=40)
LDL Cholesterol (Calc): 66 mg/dL
Non-HDL Cholesterol (Calc): 90 mg/dL (ref <130)
Total CHOL/HDL Ratio: 3.8 (calc) (ref <5.0)
Triglycerides: 159 mg/dL — ABNORMAL HIGH (ref <150)

## 2022-10-16 LAB — MICROALBUMIN / CREATININE URINE RATIO
Creatinine, Urine: 240 mg/dL (ref 20–320)
Microalb Creat Ratio: 5 mg/g creat (ref <30)
Microalb, Ur: 1.2 mg/dL

## 2022-12-05 IMAGING — DX DG HIP (WITH OR WITHOUT PELVIS) 2-3V*R*
3 series · 3 of 3 positions shown · non-contrast
Comparison: 01/16/2016

CLINICAL DATA: Right hip pain for 3 weeks

EXAM:
DG HIP (WITH OR WITHOUT PELVIS) 2-3V RIGHT

[pelvis ap]
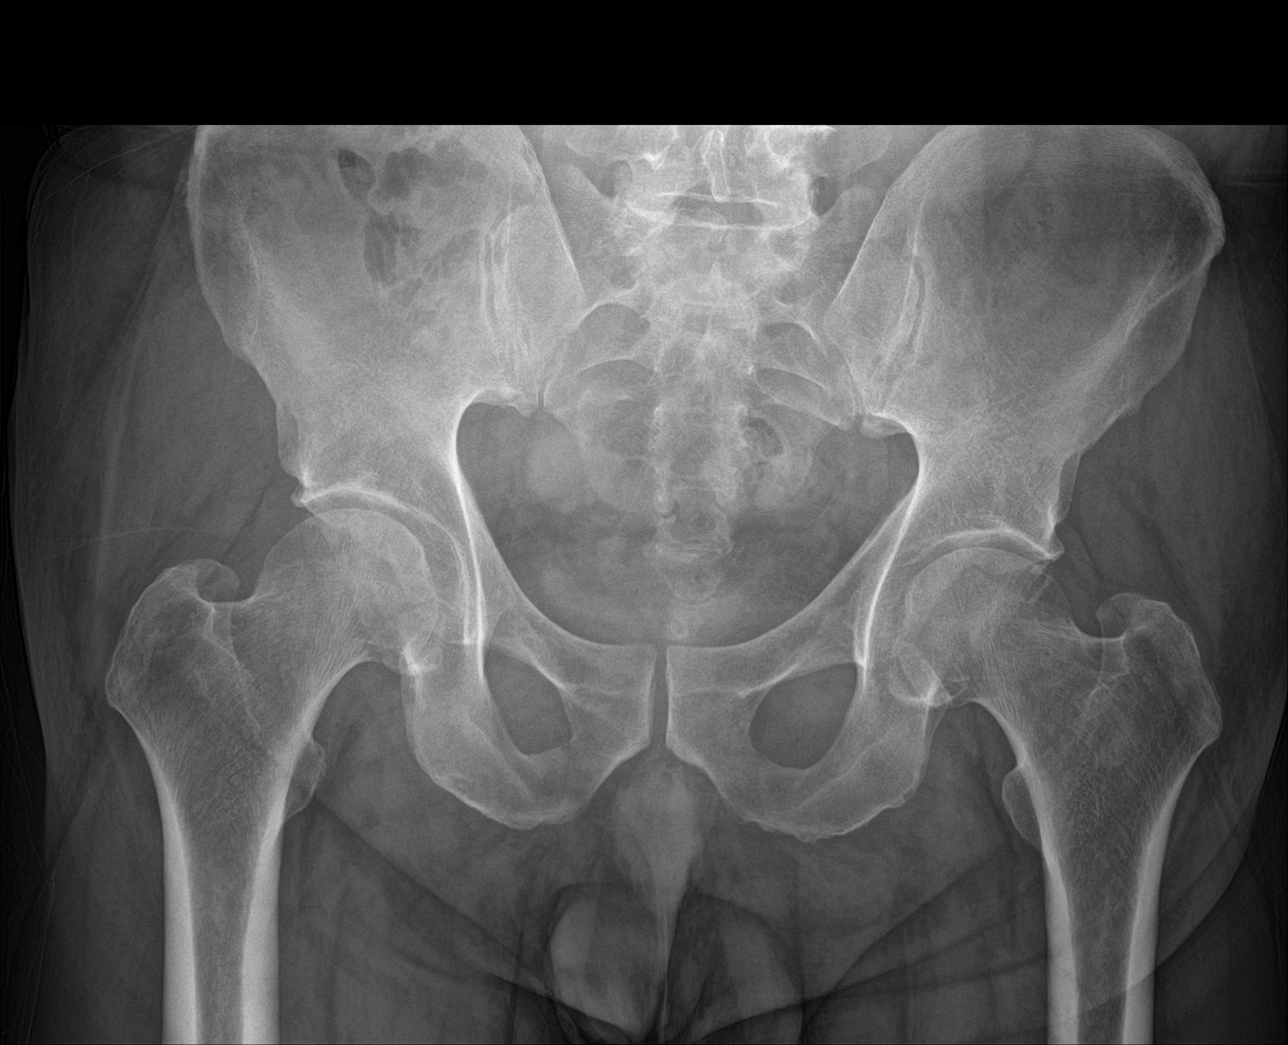

[hip ap]
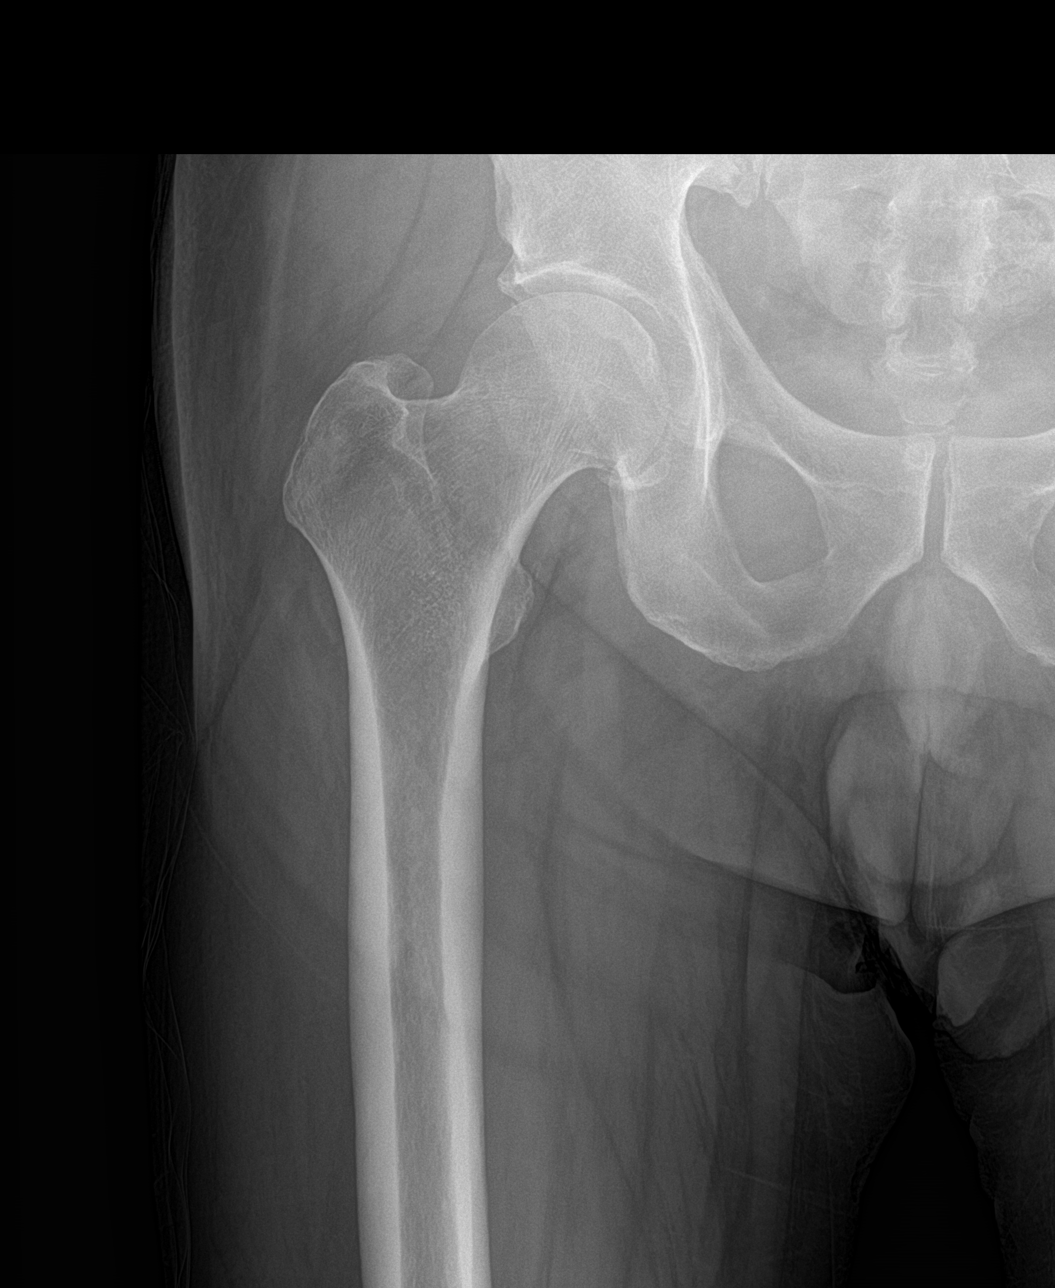

[hip lat]
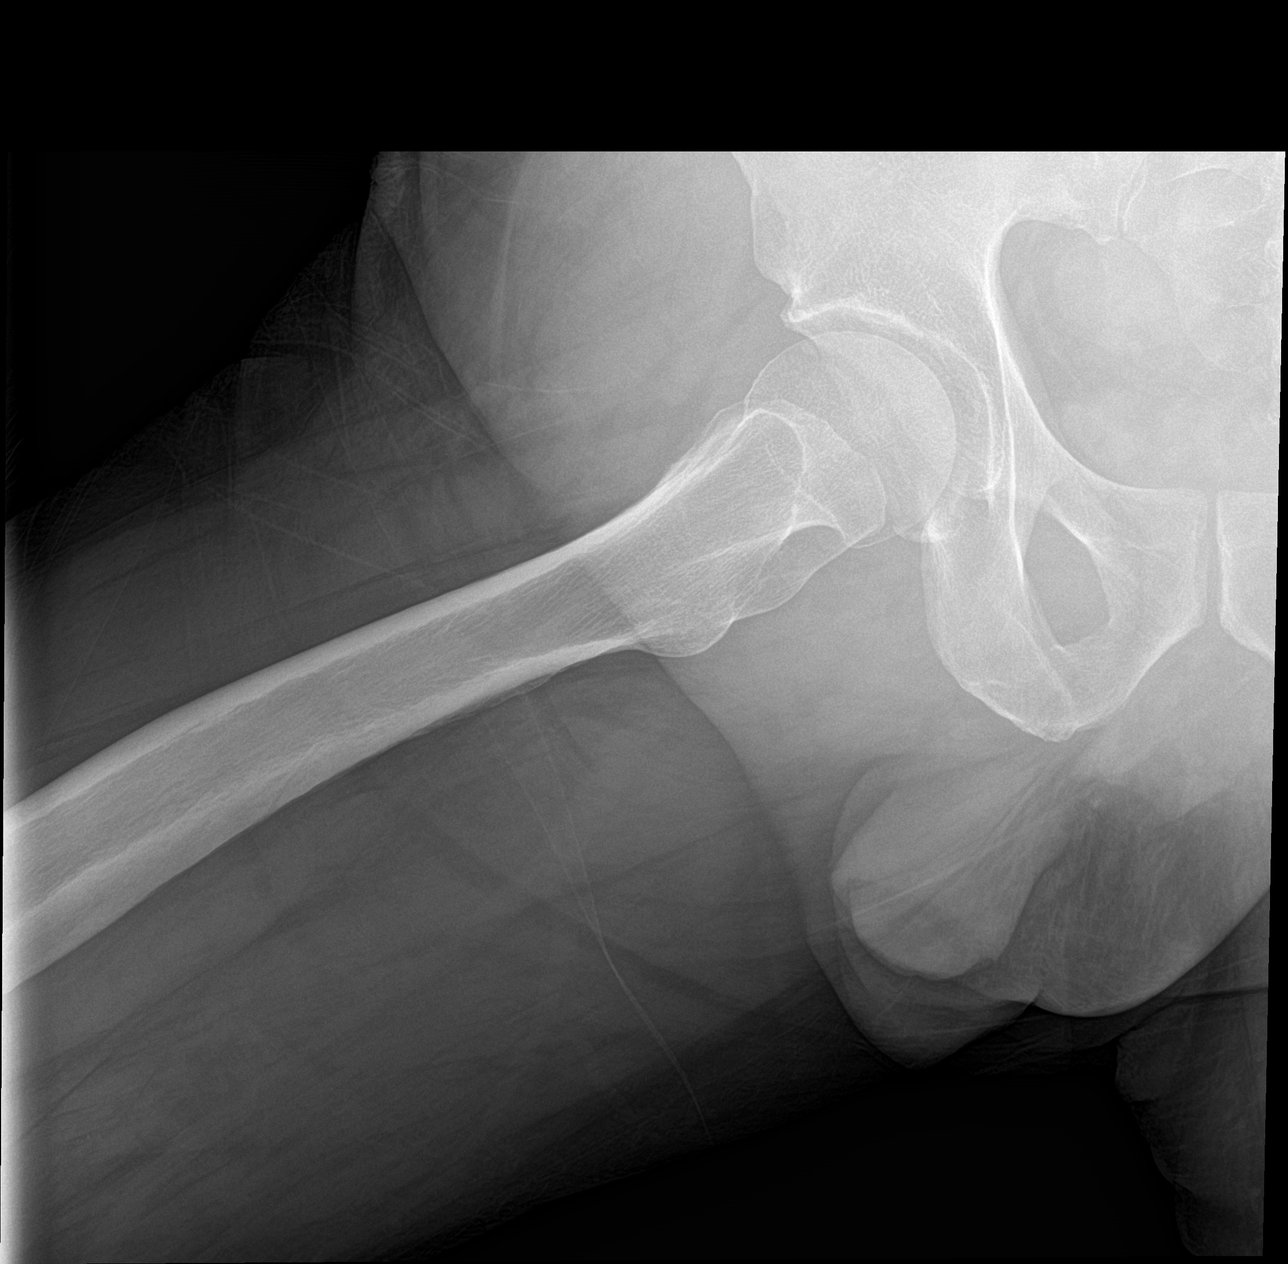

[3 of 3 positions shown; findings below may reference images not displayed]

FINDINGS: Frontal view of the pelvis as well as frontal and frogleg lateral
views of the right hip are obtained. No fracture, subluxation, or
dislocation. Joint spaces are well preserved. Sacroiliac joints are
normal.
IMPRESSION: 1. Unremarkable pelvis and right hip.

## 2023-01-08 ENCOUNTER — Other Ambulatory Visit: Payer: Self-pay | Admitting: Internal Medicine

## 2023-01-08 DIAGNOSIS — E119 Type 2 diabetes mellitus without complications: Secondary | ICD-10-CM

## 2023-01-11 NOTE — Telephone Encounter (Signed)
Refused Atarax because it's being requested too soon.

## 2023-02-19 ENCOUNTER — Ambulatory Visit (INDEPENDENT_AMBULATORY_CARE_PROVIDER_SITE_OTHER): Payer: Medicaid Other | Admitting: Internal Medicine

## 2023-02-19 ENCOUNTER — Encounter: Payer: Self-pay | Admitting: Internal Medicine

## 2023-02-19 VITALS — BP 124/80 | Ht 65.0 in | Wt 191.0 lb

## 2023-02-19 DIAGNOSIS — Z125 Encounter for screening for malignant neoplasm of prostate: Secondary | ICD-10-CM

## 2023-02-19 DIAGNOSIS — E66811 Obesity, class 1: Secondary | ICD-10-CM | POA: Diagnosis not present

## 2023-02-19 DIAGNOSIS — Z23 Encounter for immunization: Secondary | ICD-10-CM

## 2023-02-19 DIAGNOSIS — E119 Type 2 diabetes mellitus without complications: Secondary | ICD-10-CM

## 2023-02-19 DIAGNOSIS — Z6831 Body mass index (BMI) 31.0-31.9, adult: Secondary | ICD-10-CM | POA: Diagnosis not present

## 2023-02-19 DIAGNOSIS — K219 Gastro-esophageal reflux disease without esophagitis: Secondary | ICD-10-CM | POA: Diagnosis not present

## 2023-02-19 DIAGNOSIS — Z0001 Encounter for general adult medical examination with abnormal findings: Secondary | ICD-10-CM | POA: Diagnosis not present

## 2023-02-19 DIAGNOSIS — E6609 Other obesity due to excess calories: Secondary | ICD-10-CM | POA: Diagnosis not present

## 2023-02-19 MED ORDER — FLUOXETINE HCL 40 MG PO CAPS: ORAL_CAPSULE | ORAL | 1 refills | Status: DC

## 2023-02-19 MED ORDER — BUPROPION HCL ER (XL) 150 MG PO TB24: 150.0000 mg | ORAL_TABLET | Freq: Every day | ORAL | 1 refills | Status: DC

## 2023-02-19 MED ORDER — HYDROXYZINE HCL 25 MG PO TABS: ORAL_TABLET | ORAL | 1 refills | Status: DC

## 2023-02-19 MED ORDER — PANTOPRAZOLE SODIUM 20 MG PO TBEC: DELAYED_RELEASE_TABLET | ORAL | 1 refills | Status: DC

## 2023-02-19 NOTE — Patient Instructions (Signed)
Health Maintenance, Male Adopting a healthy lifestyle and getting preventive care are important in promoting health and wellness. Ask your health care provider about: The right schedule for you to have regular tests and exams. Things you can do on your own to prevent diseases and keep yourself healthy. What should I know about diet, weight, and exercise? Eat a healthy diet  Eat a diet that includes plenty of vegetables, fruits, low-fat dairy products, and lean protein. Do not eat a lot of foods that are high in solid fats, added sugars, or sodium. Maintain a healthy weight Body mass index (BMI) is a measurement that can be used to identify possible weight problems. It estimates body fat based on height and weight. Your health care provider can help determine your BMI and help you achieve or maintain a healthy weight. Get regular exercise Get regular exercise. This is one of the most important things you can do for your health. Most adults should: Exercise for at least 150 minutes each week. The exercise should increase your heart rate and make you sweat (moderate-intensity exercise). Do strengthening exercises at least twice a week. This is in addition to the moderate-intensity exercise. Spend less time sitting. Even light physical activity can be beneficial. Watch cholesterol and blood lipids Have your blood tested for lipids and cholesterol at 51 years of age, then have this test every 5 years. You may need to have your cholesterol levels checked more often if: Your lipid or cholesterol levels are high. You are older than 51 years of age. You are at high risk for heart disease. What should I know about cancer screening? Many types of cancers can be detected early and may often be prevented. Depending on your health history and family history, you may need to have cancer screening at various ages. This may include screening for: Colorectal cancer. Prostate cancer. Skin cancer. Lung  cancer. What should I know about heart disease, diabetes, and high blood pressure? Blood pressure and heart disease High blood pressure causes heart disease and increases the risk of stroke. This is more likely to develop in people who have high blood pressure readings or are overweight. Talk with your health care provider about your target blood pressure readings. Have your blood pressure checked: Every 3-5 years if you are 18-39 years of age. Every year if you are 40 years old or older. If you are between the ages of 65 and 75 and are a current or former smoker, ask your health care provider if you should have a one-time screening for abdominal aortic aneurysm (AAA). Diabetes Have regular diabetes screenings. This checks your fasting blood sugar level. Have the screening done: Once every three years after age 45 if you are at a normal weight and have a low risk for diabetes. More often and at a younger age if you are overweight or have a high risk for diabetes. What should I know about preventing infection? Hepatitis B If you have a higher risk for hepatitis B, you should be screened for this virus. Talk with your health care provider to find out if you are at risk for hepatitis B infection. Hepatitis C Blood testing is recommended for: Everyone born from 1945 through 1965. Anyone with known risk factors for hepatitis C. Sexually transmitted infections (STIs) You should be screened each year for STIs, including gonorrhea and chlamydia, if: You are sexually active and are younger than 51 years of age. You are older than 51 years of age and your   health care provider tells you that you are at risk for this type of infection. Your sexual activity has changed since you were last screened, and you are at increased risk for chlamydia or gonorrhea. Ask your health care provider if you are at risk. Ask your health care provider about whether you are at high risk for HIV. Your health care provider  may recommend a prescription medicine to help prevent HIV infection. If you choose to take medicine to prevent HIV, you should first get tested for HIV. You should then be tested every 3 months for as long as you are taking the medicine. Follow these instructions at home: Alcohol use Do not drink alcohol if your health care provider tells you not to drink. If you drink alcohol: Limit how much you have to 0-2 drinks a day. Know how much alcohol is in your drink. In the [place] one drink equals one 12 oz bottle of beer (355 mL), one 5 oz glass of wine (148 mL), or one 1 oz glass of hard liquor (44 mL). Lifestyle Do not use any products that contain nicotine or tobacco. These products include cigarettes, chewing tobacco, and vaping devices, such as e-cigarettes. If you need help quitting, ask your health care provider. Do not use street drugs. Do not share needles. Ask your health care provider for help if you need support or information about quitting drugs. General instructions Schedule regular health, dental, and eye exams. Stay current with your vaccines. Tell your health care provider if: You often feel depressed. You have ever been abused or do not feel safe at home. Summary Adopting a healthy lifestyle and getting preventive care are important in promoting health and wellness. Follow your health care provider's instructions about healthy diet, exercising, and getting tested or screened for diseases. Follow your health care provider's instructions on monitoring your cholesterol and blood pressure. This information is not intended to replace advice given to you by your health care provider. Make sure you discuss any questions you have with your health care provider. Document Revised: 08/26/2020 Document Reviewed: 08/26/2020 Elsevier Patient Education  2024 [employer]  

## 2023-02-19 NOTE — Progress Notes (Signed)
Subjective:    Patient ID: Calvin Ortiz, male    DOB: 1970-12-23, 52 y.o.   MRN: [id number]  HPI  Patient presents to clinic today for his annual exam.  Flu: 01/2022 Tetanus: 01/2019 COVID: x 1 Pneumovax: 07/2020 Shingrix: Never PSA screening: 01/2022 Colon screening: 03/2021 Vision screening: annually Dentist: as needed  Diet: He does eat meat. He consumes fruits and veggies. He does eat some fried foods. He drinks mostly sweet tea. Exercise: Yardwork  Review of Systems     Past Medical History:  Diagnosis Date   Anxiety    Depression    Diabetes mellitus without complication (HCC)    GERD (gastroesophageal reflux disease)    Hyperlipidemia    Hypertension    Migraines    Myocardial infarction (HCC)    3 Stents   Sleep apnea    Stroke [facility])     Current Outpatient Medications  Medication Sig Dispense Refill   acetaminophen (TYLENOL) 500 MG tablet Take 1,000 mg by mouth every 6 (six) hours as needed.     amLODipine (NORVASC) 10 MG tablet TAKE 1 TABLET(10 MG) BY MOUTH DAILY 90 tablet 1   aspirin EC 81 MG tablet Take 81 mg by mouth daily.     atorvastatin (LIPITOR) 40 MG tablet TAKE 1 TABLET(40 MG) BY MOUTH DAILY 90 tablet 1   benazepril (LOTENSIN) 40 MG tablet TAKE 1 TABLET(40 MG) BY MOUTH DAILY 90 tablet 1   buPROPion (WELLBUTRIN XL) 150 MG 24 hr tablet Take 1 tablet (150 mg total) by mouth daily. 90 tablet 1   carvedilol (COREG) 25 MG tablet TAKE 1 TABLET(25 MG) BY MOUTH TWICE DAILY WITH A MEAL 180 tablet 1   fenofibrate (TRICOR) 145 MG tablet TAKE 1 TABLET(145 MG) BY MOUTH DAILY 90 tablet 1   fexofenadine (ALLEGRA) 180 MG tablet Take 1 tablet (180 mg total) by mouth daily. 90 tablet 3   FLUoxetine (PROZAC) 40 MG capsule TAKE 1 CAPSULE(40 MG) BY MOUTH DAILY 90 capsule 1   fluticasone (FLONASE) 50 MCG/ACT nasal spray 2 sprays in each nostril daily 16 g 3   hydrOXYzine (ATARAX) 25 MG tablet TAKE 2 TABLETS BY MOUTH EVERY NIGHT AT BEDTIME. MAY ALSO TAKE 1/2- 1  TABLET 2 TIMES DAILY AS NEEDED FOR ANXIETY 270 tablet 1   isosorbide mononitrate (IMDUR) 120 MG 24 hr tablet TAKE 1 TABLET(120 MG) BY MOUTH DAILY 90 tablet 1   naproxen (NAPROSYN) 500 MG tablet Take 1 tablet (500 mg total) by mouth 2 (two) times daily with a meal. For 2-4 weeks then as needed 30 tablet 0   nitroGLYCERIN (NITROSTAT) 0.4 MG SL tablet PLACE 1 TABLET UNDER THE TONGUE EVERY 5 MINUTES AS NEEDED FOR CHEST PAIN. MAY TAKE UP TO 3 DOSES 25 tablet 1   pantoprazole (PROTONIX) 20 MG tablet TAKE 1 TABLET(20 MG) BY MOUTH DAILY 90 tablet 0   No current facility-administered medications for this visit.    Allergies  Allergen Reactions   Buprenorphine Hcl     Other reaction(s): Other (See Comments), Other (See Comments)   Morphine And Codeine    Sulfa Antibiotics Rash    Family History  Problem Relation Age of Onset   COPD Mother    Hypertension Mother    Hyperlipidemia Mother    Depression Mother    Heart disease Mother    COPD Father    Diabetes Father    Heart disease Father    Stroke Father    Cancer Father  pelvic mass   Lung cancer Father    Depression Father    Diabetes Paternal Grandmother     Social History   Socioeconomic History   Marital status: Single    Spouse name: Not on file   Number of children: Not on file   Years of education: Not on file   Highest education level: Not on file  Occupational History   Not on file  Tobacco Use   Smoking status: Every Day    Current packs/day: 0.00    Types: Cigarettes    Last attempt to quit: 04/13/1993    Years since quitting: 29.8   Smokeless tobacco: Never   Tobacco comments:    Has quit off and on about 5-6 times (09/2017)  Vaping Use   Vaping status: Never Used  Substance and Sexual Activity   Alcohol use: No   Drug use: No   Sexual activity: Not Currently  Other Topics Concern   Not on file  Social History Narrative   Not on file   Social Determinants of Health   Financial Resource Strain:  Not on file  Food Insecurity: Not on file  Transportation Needs: Not on file  Physical Activity: Not on file  Stress: Not on file  Social Connections: Not on file  Intimate Partner Violence: Not on file     Constitutional: Denies fever, malaise, fatigue, headache or abrupt weight changes.  HEENT: Denies eye pain, eye redness, ear pain, ringing in the ears, wax buildup, runny nose, nasal congestion, bloody nose, or sore throat. Respiratory: Denies difficulty breathing, shortness of breath, cough or sputum production.   Cardiovascular: Denies chest pain, chest tightness, palpitations or swelling in the hands or feet.  Gastrointestinal: Denies abdominal pain, bloating, constipation, diarrhea or blood in the stool.  GU: Denies urgency, frequency, pain with urination, burning sensation, blood in urine, odor or discharge. Musculoskeletal: Denies decrease in range of motion, difficulty with gait, muscle pain or joint pain and swelling.  Skin: Denies redness, rashes, lesions or ulcercations.  Neurological: Denies dizziness, difficulty with memory, difficulty with speech or problems with balance and coordination.  Psych: Patient has a history of anxiety and depression.  Denies SI/HI.  No other specific complaints in a complete review of systems (except as listed in HPI above).  Objective:   Physical Exam  BP 124/80   Ht 5\' 5"  (1.651 m)   Wt 191 lb (86.6 kg)   BMI 31.78 kg/m   Wt Readings from Last 3 Encounters:  10/15/22 189 lb (85.7 kg)  07/14/22 194 lb (88 kg)  02/16/22 188 lb (85.3 kg)    General: Appears his stated age, obese, in NAD. Skin: Warm, dry and intact. No ulcerations noted. HEENT: Head: normal shape and size; Eyes: sclera white, no icterus, conjunctiva pink, PERRLA and EOMs intact;  Neck:  Neck supple, trachea midline. No masses, lumps or thyromegaly present.  Cardiovascular: Normal rate and rhythm. S1,S2 noted.  No murmur, rubs or gallops noted. No JVD or BLE edema. No  carotid bruits noted. Varicose veins noted of BLE. Pulmonary/Chest: Normal effort and positive vesicular breath sounds. No respiratory distress. No wheezes, rales or ronchi noted.  Abdomen: Soft and nontender. Normal bowel sounds.  Musculoskeletal: Strength 5/5 BUE/BLE. No difficulty with gait.  Neurological: Alert and oriented. Cranial nerves II-XII grossly intact. Coordination normal.  Psychiatric: Mood and affect normal. Behavior is normal. Judgment and thought content normal.    BMET    Component Value Date/Time   NA 140  10/15/2022 1342   NA 138 12/10/2016 1902   NA 140 02/03/2012 0313   K 4.4 10/15/2022 1342   K 3.5 02/03/2012 0313   CL 107 10/15/2022 1342   CL 106 02/03/2012 0313   CO2 24 10/15/2022 1342   CO2 22 02/03/2012 0313   GLUCOSE 129 (H) 10/15/2022 1342   GLUCOSE 151 (H) 02/03/2012 0313   BUN 13 10/15/2022 1342   BUN 15 12/10/2016 1902   BUN 9 02/03/2012 0313   CREATININE 1.09 10/15/2022 1342   CALCIUM 9.3 10/15/2022 1342   CALCIUM 9.2 02/03/2012 0313   GFRNONAA >60 05/07/2020 1612   GFRNONAA 66 08/17/2019 0948   GFRAA 77 08/17/2019 0948    Lipid Panel     Component Value Date/Time   CHOL 122 10/15/2022 1342   CHOL 139 12/10/2016 1902   TRIG 159 (H) 10/15/2022 1342   HDL 32 (L) 10/15/2022 1342   HDL 22 (L) 12/10/2016 1902   CHOLHDL 3.8 10/15/2022 1342   LDLCALC 66 10/15/2022 1342    CBC    Component Value Date/Time   WBC 8.4 10/15/2022 1342   RBC 5.38 10/15/2022 1342   HGB 15.7 10/15/2022 1342   HGB 14.9 04/02/2016 1939   HCT 47.2 10/15/2022 1342   HCT 43.6 04/02/2016 1939   PLT 262 10/15/2022 1342   PLT 268 04/02/2016 1939   MCV 87.7 10/15/2022 1342   MCV 86 04/02/2016 1939   MCV 87 02/03/2012 0313   MCH 29.2 10/15/2022 1342   MCHC 33.3 10/15/2022 1342   RDW 13.1 10/15/2022 1342   RDW 13.3 04/02/2016 1939   RDW 13.0 02/03/2012 0313   LYMPHSABS 3,717 08/17/2019 0948   LYMPHSABS 3.3 (H) 04/02/2016 1939   EOSABS 351 08/17/2019 0948    EOSABS 0.3 04/02/2016 1939   BASOSABS 99 08/17/2019 0948   BASOSABS 0.1 04/02/2016 1939    Hgb A1C Lab Results  Component Value Date   HGBA1C 5.7 (A) 10/15/2022            Assessment & Plan:   Preventative health maintenance:  Flu shot today Tetanus UTD Encouraged him to get his COVID-vaccine Pneumovax UTD Discussed Shingrix vaccine, he will check coverage with his insurance company and schedule visit he would like to have this done Colon screening UTD Encouraged him to consume a balanced diet and exercise regimen Advised him to see an eye doctor and dentist annually We will check CBC, c-Met, lipid, A1c and PSA today  RTC in 6 months, follow-up chronic conditions Nicki Reaper, NP

## 2023-02-19 NOTE — Assessment & Plan Note (Signed)
Encouraged diet and exercise for weight loss ?

## 2023-02-20 LAB — CBC
HCT: 46.9 % (ref 38.5–50.0)
Hemoglobin: 15.4 g/dL (ref 13.2–17.1)
MCH: 29.2 pg (ref 27.0–33.0)
MCHC: 32.8 g/dL (ref 32.0–36.0)
MCV: 89 fL (ref 80.0–100.0)
MPV: 10 fL (ref 7.5–12.5)
Platelets: 341 10*3/uL (ref 140–400)
RBC: 5.27 10*6/uL (ref 4.20–5.80)
RDW: 13.1 % (ref 11.0–15.0)
WBC: 9.1 10*3/uL (ref 3.8–10.8)

## 2023-02-20 LAB — COMPLETE METABOLIC PANEL WITH GFR
AG Ratio: 1.9 (calc) (ref 1.0–2.5)
ALT: 15 U/L (ref 9–46)
AST: 18 U/L (ref 10–35)
Albumin: 4.5 g/dL (ref 3.6–5.1)
Alkaline phosphatase (APISO): 51 U/L (ref 35–144)
BUN: 16 mg/dL (ref 7–25)
CO2: 26 mmol/L (ref 20–32)
Calcium: 9.3 mg/dL (ref 8.6–10.3)
Chloride: 105 mmol/L (ref 98–110)
Creat: 1.19 mg/dL (ref 0.70–1.30)
Globulin: 2.4 g/dL (ref 1.9–3.7)
Glucose, Bld: 111 mg/dL — ABNORMAL HIGH (ref 65–99)
Potassium: 4.3 mmol/L (ref 3.5–5.3)
Sodium: 137 mmol/L (ref 135–146)
Total Bilirubin: 0.4 mg/dL (ref 0.2–1.2)
Total Protein: 6.9 g/dL (ref 6.1–8.1)
eGFR: 74 mL/min/{1.73_m2} (ref >=60)

## 2023-02-20 LAB — HEMOGLOBIN A1C
Hgb A1c MFr Bld: 5.8 %{Hb} — ABNORMAL HIGH (ref <5.7)
Mean Plasma Glucose: 120 mg/dL
eAG (mmol/L): 6.6 mmol/L

## 2023-02-20 LAB — LIPID PANEL
Cholesterol: 143 mg/dL (ref <200)
HDL: 38 mg/dL — ABNORMAL LOW (ref >=40)
LDL Cholesterol (Calc): 82 mg/dL
Non-HDL Cholesterol (Calc): 105 mg/dL (ref <130)
Total CHOL/HDL Ratio: 3.8 (calc) (ref <5.0)
Triglycerides: 134 mg/dL (ref <150)

## 2023-02-20 LAB — PSA: PSA: 0.65 ng/mL (ref <=4.00)

## 2023-03-01 IMAGING — MR MR LUMBAR SPINE W/O CM
5 series · 31 of 48 positions shown · non-contrast
Comparison: X-ray 08/27/2020

CLINICAL DATA: Low back pain with right-sided radiculopathy for 2
months. History of prior back surgery in 7006

EXAM:
MRI LUMBAR SPINE WITHOUT CONTRAST
TECHNIQUE: Multiplanar, multisequence MR imaging of the lumbar spine was
performed. No intravenous contrast was administered.

[Series 5: T2 · sagittal · 4.0mm · 0.81mm/px · 6 of 17 slices shown (1 of 2)]
[im 1/17]
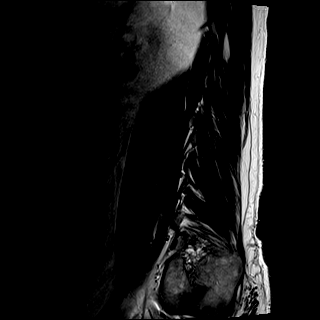
[im 4/17]
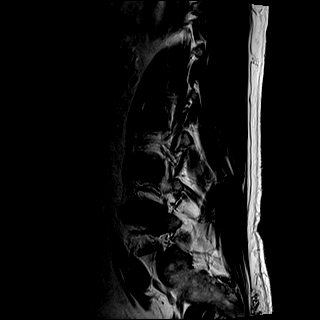
[im 7/17]
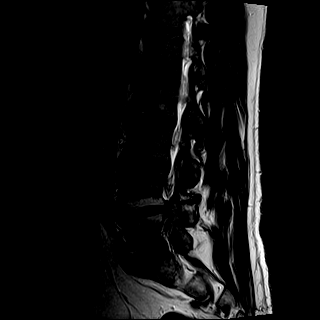
[im 10/17]
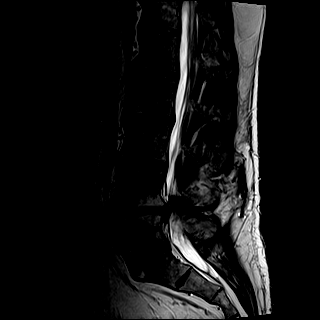
[im 13/17]
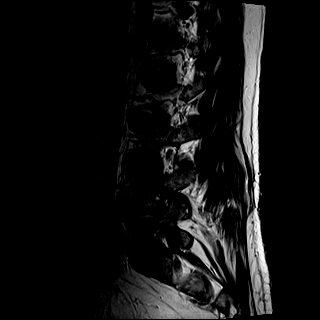
[im 17/17]
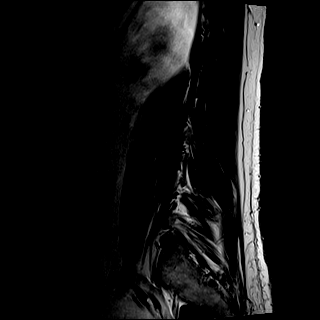

[Series 6: T1 · sagittal · 4.0mm · 0.81mm/px · 6 of 17 slices shown (1 of 2)]
[im 1/17]
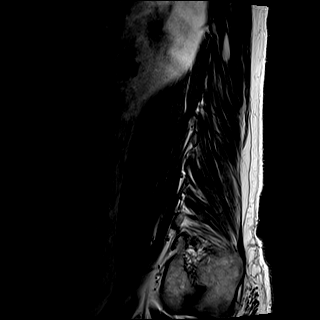
[im 4/17]
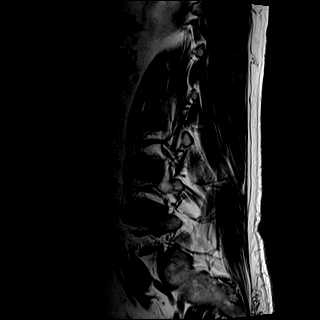
[im 7/17]
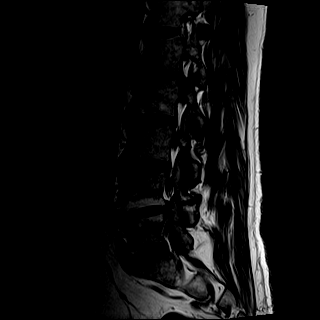
[im 10/17]
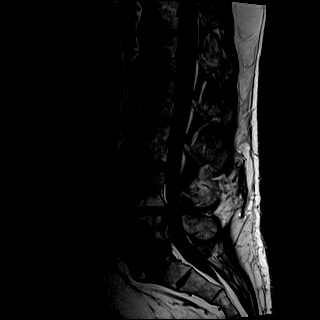
[im 13/17]
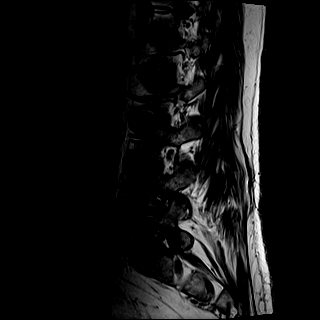
[im 17/17]
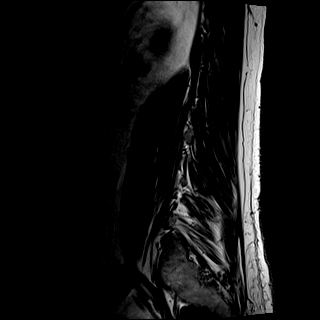

[Series 7: STIR · sagittal · 4.0mm · 0.41mm/px · 1 of 17 slices shown]
[im 1/17]
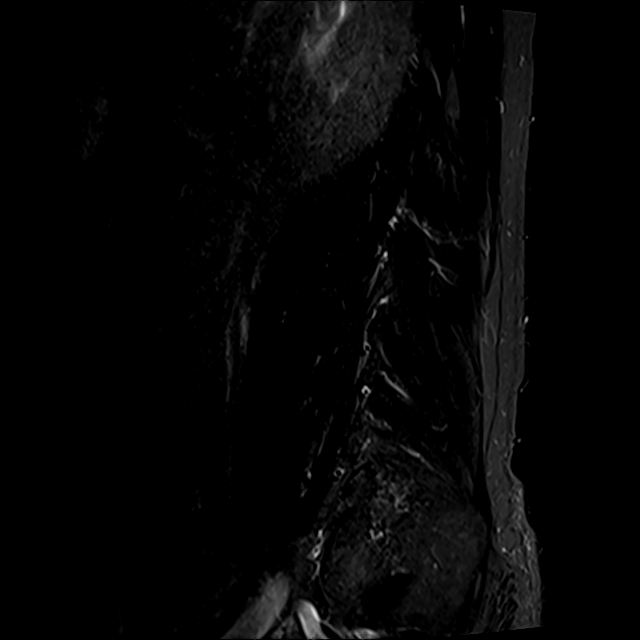

[Series 8: T2 · axial · 4.0mm · 0.78mm/px · z∈[-20,+205]mm · 9 of 40 slices shown (2 of 2)]
[im 1/40]
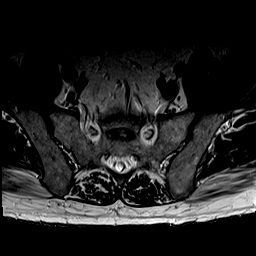
[im 6/40]
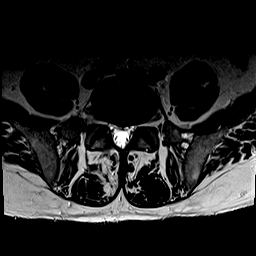
[im 12/40]
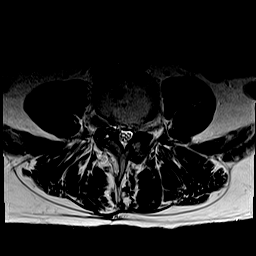
[im 17/40]
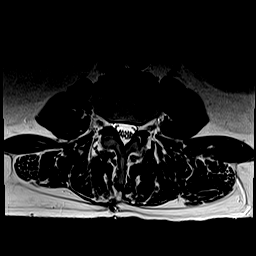
[im 20/40]
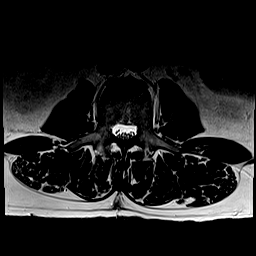
[im 23/40]
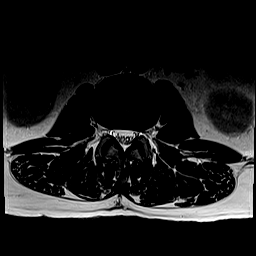
[im 28/40]
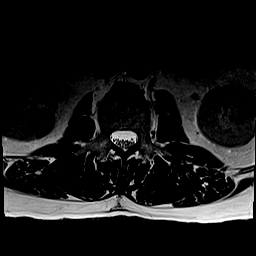
[im 34/40]
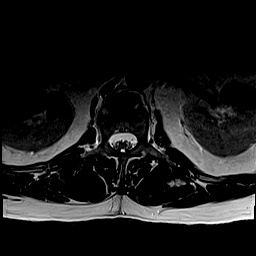
[im 40/40]
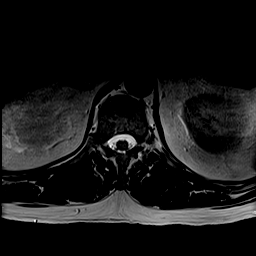

[Series 9: T1 · axial · 4.0mm · 0.39mm/px · z∈[-20,+205]mm · 9 of 40 slices shown (2 of 2)]
[im 1/40]
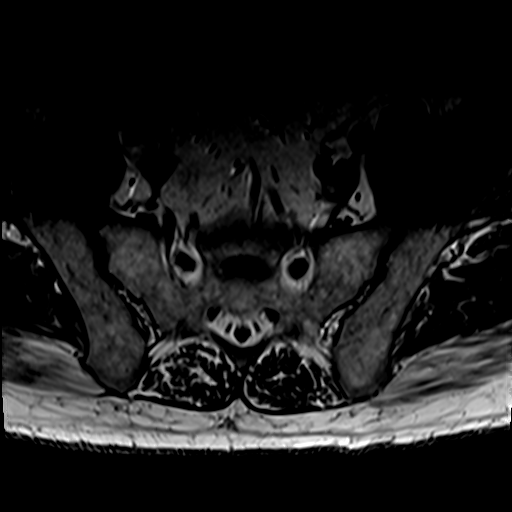
[im 6/40]
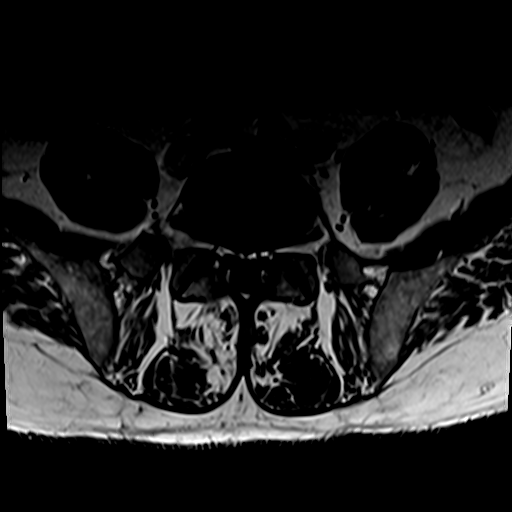
[im 12/40]
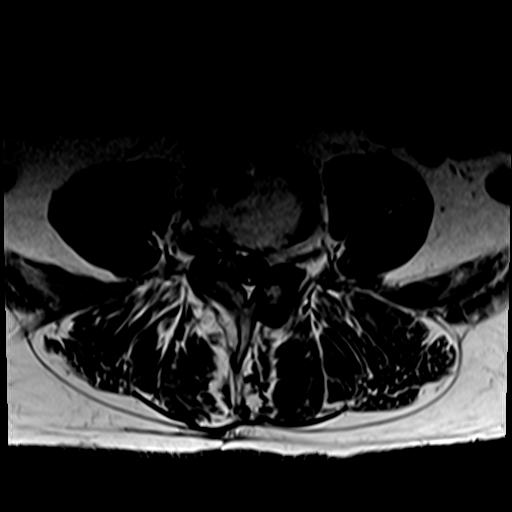
[im 17/40]
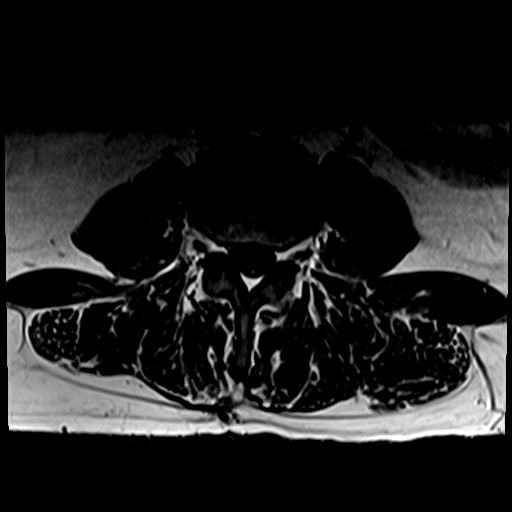
[im 20/40]
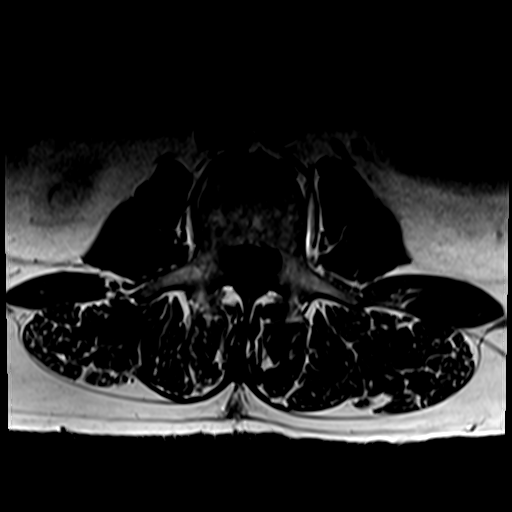
[im 23/40]
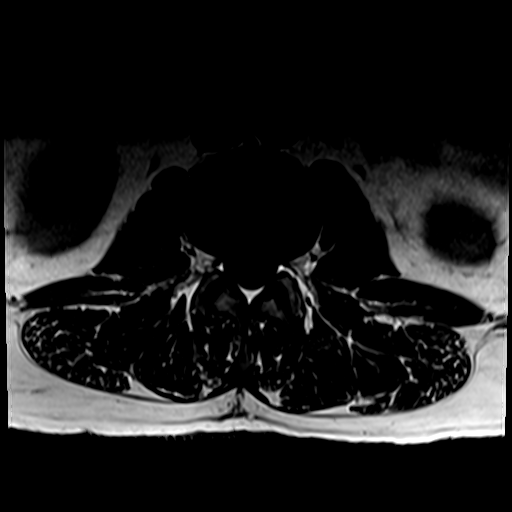
[im 28/40]
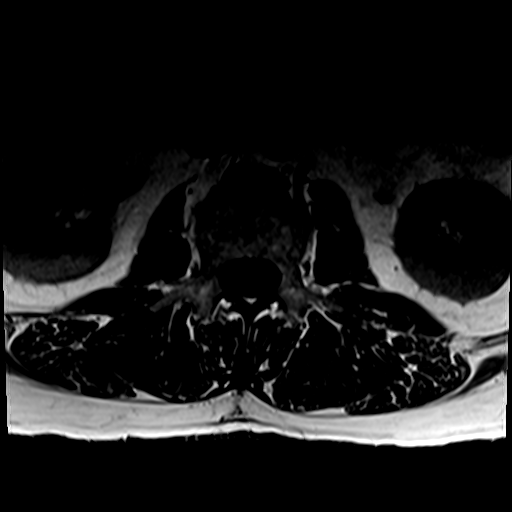
[im 34/40]
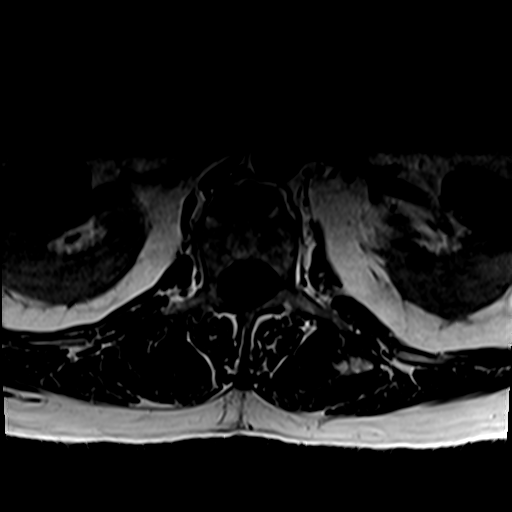
[im 40/40]
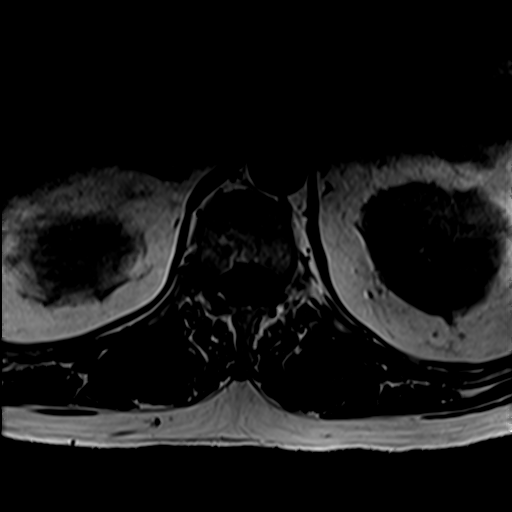

[31 of 48 positions shown; findings below may reference images not displayed]

FINDINGS: Segmentation:  Standard.

Alignment:  Physiologic.

Vertebrae: No fracture, evidence of discitis, or bone lesion.
Chronic discogenic endplate marrow changes at L4-5.

Conus medullaris and cauda equina: Conus extends to the L1-2 level.
Conus and cauda equina appear normal.

Paraspinal and other soft tissues: Negative.

Disc levels:

T12-L1: Unremarkable.

L1-L2: Unremarkable.

L2-L3: Minimal annular disc bulge. Minimal bilateral facet
arthropathy. No foraminal or canal stenosis.

L3-L4: Minimal annular disc bulge, slightly eccentric to the left.
Mild bilateral facet arthropathy. No foraminal or canal stenosis.

L4-L5: Postoperative changes to the right lamina. Disc height loss
with posterior disc osteophyte complex. Advanced bilateral facet
arthropathy. Moderate to severe canal stenosis with severe right and
mild left foraminal stenosis.

L5-S1: Shallow central disc protrusion. Mild bilateral facet
arthropathy. Mild bilateral foraminal stenosis. No canal stenosis.
IMPRESSION: 1. Multilevel lumbar spondylosis with moderate-to-severe canal
stenosis with severe right and mild left foraminal stenosis at L4-5.
2. Mild bilateral foraminal stenosis at L5-S1.

## 2023-04-06 ENCOUNTER — Encounter: Payer: Self-pay | Admitting: Internal Medicine

## 2023-04-06 NOTE — Telephone Encounter (Signed)
 Care team updated and letter sent for eye exam notes.

## 2023-04-08 ENCOUNTER — Other Ambulatory Visit: Payer: Self-pay | Admitting: Internal Medicine

## 2023-04-08 DIAGNOSIS — I10 Essential (primary) hypertension: Secondary | ICD-10-CM

## 2023-04-08 NOTE — Telephone Encounter (Signed)
Requested Prescriptions  Pending Prescriptions Disp Refills   amLODipine (NORVASC) 10 MG tablet [Pharmacy Med Name: AMLODIPINE BESYLATE 10MG  TABLETS] 90 tablet 1    Sig: TAKE 1 TABLET(10 MG) BY MOUTH DAILY     Cardiovascular: Calcium Channel Blockers 2 Passed - 04/08/2023 11:13 AM      Passed - Last BP in normal range    BP Readings from Last 1 Encounters:  02/19/23 124/80         Passed - Last Heart Rate in normal range    Pulse Readings from Last 1 Encounters:  10/15/22 60         Passed - Valid encounter within last 6 months    Recent Outpatient Visits           1 month ago Encounter for general adult medical examination with abnormal findings   [place] [facility] [place], [place] W, NP   5 months ago Diabetes mellitus without complication [facility])   [place] [facility] [place], Salvadore Oxford, NP   7 months ago No-show for appointment   [facility] [place], Salvadore Oxford, NP   8 months ago Left foot pain   [place] [facility] [place], Salvadore Oxford, NP   1 year ago Encounter for general adult medical examination with abnormal findings   [place] [facility] [place], Salvadore Oxford, NP       Future Appointments             In 4 months [name], Salvadore Oxford, NP [place] [facility], [facility]

## 2023-05-14 ENCOUNTER — Other Ambulatory Visit: Payer: Self-pay | Admitting: Internal Medicine

## 2023-05-14 DIAGNOSIS — I10 Essential (primary) hypertension: Secondary | ICD-10-CM

## 2023-05-14 NOTE — Telephone Encounter (Signed)
Requested Prescriptions  Pending Prescriptions Disp Refills   isosorbide mononitrate (IMDUR) 120 MG 24 hr tablet [Pharmacy Med Name: ISOSORBIDE MONONITRATE 120MG  ER TAB] 90 tablet 0    Sig: TAKE 1 TABLET(120 MG) BY MOUTH DAILY     Cardiovascular:  Nitrates Passed - 05/14/2023  3:29 PM      Passed - Last BP in normal range    BP Readings from Last 1 Encounters:  02/19/23 124/80         Passed - Last Heart Rate in normal range    Pulse Readings from Last 1 Encounters:  10/15/22 60         Passed - Valid encounter within last 12 months    Recent Outpatient Visits           2 months ago Encounter for general adult medical examination with abnormal findings   [place] [facility] [place], [place] W, NP   7 months ago Diabetes mellitus without complication [facility])   [place] [facility] [place], Salvadore Oxford, NP   8 months ago No-show for appointment   [facility] [place], Salvadore Oxford, NP   10 months ago Left foot pain   [place] [facility] [place], Salvadore Oxford, NP   1 year ago Encounter for general adult medical examination with abnormal findings   [place] [facility] [place], Salvadore Oxford, NP       Future Appointments             In 3 months [name], Salvadore Oxford, NP [place] [facility], [facility]

## 2023-05-27 LAB — HM DIABETES EYE EXAM

## 2023-05-31 ENCOUNTER — Ambulatory Visit
Admission: RE | Admit: 2023-05-31 | Discharge: 2023-05-31 | Disposition: A | Discharge status: Home / Self Care | Payer: Medicaid Other | Source: Ambulatory Visit | Attending: Internal Medicine | Admitting: Internal Medicine

## 2023-05-31 DIAGNOSIS — F17210 Nicotine dependence, cigarettes, uncomplicated: Secondary | ICD-10-CM | POA: Diagnosis not present

## 2023-05-31 DIAGNOSIS — H5213 Myopia, bilateral: Secondary | ICD-10-CM | POA: Diagnosis not present

## 2023-05-31 DIAGNOSIS — Z87891 Personal history of nicotine dependence: Secondary | ICD-10-CM | POA: Insufficient documentation

## 2023-06-14 ENCOUNTER — Other Ambulatory Visit: Payer: Self-pay

## 2023-06-14 DIAGNOSIS — Z87891 Personal history of nicotine dependence: Secondary | ICD-10-CM

## 2023-06-14 DIAGNOSIS — F17210 Nicotine dependence, cigarettes, uncomplicated: Secondary | ICD-10-CM

## 2023-06-14 DIAGNOSIS — Z122 Encounter for screening for malignant neoplasm of respiratory organs: Secondary | ICD-10-CM

## 2023-08-20 ENCOUNTER — Encounter: Payer: Self-pay | Admitting: Internal Medicine

## 2023-08-20 ENCOUNTER — Ambulatory Visit: Payer: Self-pay | Admitting: Internal Medicine

## 2023-08-20 VITALS — BP 124/72 | Ht 65.0 in | Wt 185.4 lb

## 2023-08-20 DIAGNOSIS — E119 Type 2 diabetes mellitus without complications: Secondary | ICD-10-CM | POA: Diagnosis not present

## 2023-08-20 DIAGNOSIS — M5441 Lumbago with sciatica, right side: Secondary | ICD-10-CM

## 2023-08-20 DIAGNOSIS — E66811 Obesity, class 1: Secondary | ICD-10-CM | POA: Diagnosis not present

## 2023-08-20 DIAGNOSIS — I252 Old myocardial infarction: Secondary | ICD-10-CM | POA: Diagnosis not present

## 2023-08-20 DIAGNOSIS — E1169 Type 2 diabetes mellitus with other specified complication: Secondary | ICD-10-CM | POA: Diagnosis not present

## 2023-08-20 DIAGNOSIS — E785 Hyperlipidemia, unspecified: Secondary | ICD-10-CM

## 2023-08-20 DIAGNOSIS — M25561 Pain in right knee: Secondary | ICD-10-CM | POA: Diagnosis not present

## 2023-08-20 DIAGNOSIS — K219 Gastro-esophageal reflux disease without esophagitis: Secondary | ICD-10-CM | POA: Diagnosis not present

## 2023-08-20 DIAGNOSIS — I2510 Atherosclerotic heart disease of native coronary artery without angina pectoris: Secondary | ICD-10-CM

## 2023-08-20 DIAGNOSIS — M545 Low back pain, unspecified: Secondary | ICD-10-CM | POA: Insufficient documentation

## 2023-08-20 DIAGNOSIS — G8929 Other chronic pain: Secondary | ICD-10-CM

## 2023-08-20 DIAGNOSIS — I10 Essential (primary) hypertension: Secondary | ICD-10-CM | POA: Diagnosis not present

## 2023-08-20 DIAGNOSIS — F419 Anxiety disorder, unspecified: Secondary | ICD-10-CM | POA: Diagnosis not present

## 2023-08-20 DIAGNOSIS — E7849 Other hyperlipidemia: Secondary | ICD-10-CM

## 2023-08-20 DIAGNOSIS — F32A Depression, unspecified: Secondary | ICD-10-CM

## 2023-08-20 DIAGNOSIS — E6609 Other obesity due to excess calories: Secondary | ICD-10-CM

## 2023-08-20 MED ORDER — FLUOXETINE HCL 40 MG PO CAPS
40.0000 mg | ORAL_CAPSULE | Freq: Every day | ORAL | 1 refills | Status: DC
  Filled 2023-09-21 – 2023-10-15 (×4): qty 90, 90d supply, fill #0
  Filled 2024-01-17: qty 90, 90d supply, fill #1

## 2023-08-20 MED ORDER — ISOSORBIDE MONONITRATE ER 120 MG PO TB24
120.0000 mg | ORAL_TABLET | Freq: Every day | ORAL | 1 refills | Status: DC
  Filled 2023-09-21: qty 90, 90d supply, fill #0
  Filled 2023-12-16: qty 90, 90d supply, fill #1

## 2023-08-20 MED ORDER — HYDROXYZINE HCL 25 MG PO TABS
ORAL_TABLET | ORAL | 1 refills | Status: DC
  Filled 2023-09-21: qty 120, 30d supply, fill #0
  Filled 2023-10-21 – 2023-11-09 (×2): qty 120, 30d supply, fill #1
  Filled 2023-12-16: qty 120, 30d supply, fill #2
  Filled 2024-01-17: qty 120, 30d supply, fill #3
  Filled 2024-02-14: qty 120, 30d supply, fill #4

## 2023-08-20 MED ORDER — FENOFIBRATE 145 MG PO TABS
145.0000 mg | ORAL_TABLET | Freq: Every day | ORAL | 1 refills | Status: DC
  Filled 2023-09-21: qty 90, 90d supply, fill #0
  Filled 2023-12-16: qty 90, 90d supply, fill #1

## 2023-08-20 MED ORDER — BENAZEPRIL HCL 40 MG PO TABS
40.0000 mg | ORAL_TABLET | Freq: Every day | ORAL | 1 refills | Status: DC
  Filled 2023-09-21: qty 90, 90d supply, fill #0
  Filled 2023-12-16: qty 90, 90d supply, fill #1

## 2023-08-20 MED ORDER — ATORVASTATIN CALCIUM 40 MG PO TABS: ORAL_TABLET | ORAL | 1 refills | Status: DC

## 2023-08-20 MED ORDER — CARVEDILOL 25 MG PO TABS
25.0000 mg | ORAL_TABLET | Freq: Two times a day (BID) | ORAL | 1 refills | Status: DC
  Filled 2023-09-21 – 2023-11-09 (×7): qty 180, 90d supply, fill #0
  Filled 2024-02-14: qty 180, 90d supply, fill #1

## 2023-08-20 MED ORDER — AMLODIPINE BESYLATE 10 MG PO TABS
10.0000 mg | ORAL_TABLET | Freq: Every day | ORAL | 1 refills | Status: DC
  Filled 2023-09-21 – 2023-10-12 (×2): qty 90, 90d supply, fill #0
  Filled 2024-01-17: qty 90, 90d supply, fill #1

## 2023-08-20 MED ORDER — PANTOPRAZOLE SODIUM 20 MG PO TBEC
20.0000 mg | DELAYED_RELEASE_TABLET | Freq: Every day | ORAL | 1 refills | Status: DC
  Filled 2023-09-21 – 2023-10-18 (×5): qty 90, 90d supply, fill #0
  Filled 2024-01-17: qty 90, 90d supply, fill #1

## 2023-08-20 MED ORDER — BUPROPION HCL ER (XL) 150 MG PO TB24
150.0000 mg | ORAL_TABLET | Freq: Every day | ORAL | 1 refills | Status: DC
  Filled 2023-09-21 – 2023-10-12 (×2): qty 90, 90d supply, fill #0
  Filled 2024-01-17: qty 90, 90d supply, fill #1

## 2023-08-20 NOTE — Assessment & Plan Note (Signed)
 Will obtain xray bilateral knees Encouraged weight loss as this can help reduce joint pain Ok to continue tylenol  OTC as needed

## 2023-08-20 NOTE — Assessment & Plan Note (Signed)
 Controlled on amlodipine -benazepril , carvedilol  Reinforced diet and exercise for weight loss C-Met today

## 2023-08-20 NOTE — Assessment & Plan Note (Signed)
No angina C-Met and lipid profile today Continue atorvastatin, fenofibrate, isosorbide, carvedilol and aspirin 

## 2023-08-20 NOTE — Assessment & Plan Note (Signed)
Avoid foods that trigger reflux Encouraged weight loss as this can help reduce reflux symptoms Continue pantoprazole 

## 2023-08-20 NOTE — Assessment & Plan Note (Signed)
 Encouraged regular stretching and core strengthening Continue back brace as needed Continue tylenol  OTC as needed

## 2023-08-20 NOTE — Assessment & Plan Note (Signed)
 A1C today We will check urine microalbumin No meds Encourage low-carb diet and exercise for weight loss Encouraged routine foot exam Encouraged routine eye exam Immunizations UTD

## 2023-08-20 NOTE — Assessment & Plan Note (Signed)
 Continue fluoxetine , bupropion  and hydroxyzine  Referral to therapy placed Support offered

## 2023-08-20 NOTE — Patient Instructions (Signed)

## 2023-08-20 NOTE — Assessment & Plan Note (Signed)
 Encouraged diet and exercise for weight loss ?

## 2023-08-20 NOTE — Progress Notes (Signed)
 Subjective:    Patient ID: Calvin Ortiz, male    DOB: Sep 25, 1970, 53 y.o.   MRN: [id number]  HPI  Patient presents to clinic today for follow-up of chronic conditions.  DM2: His last A1c was 5.8%, 02/2023.  He is not taking any oral diabetic medication at this time.  He is on benazepril  for renal protection.  He does not check his sugars.  He checks his feet routinely.  His last eye exam was 05/2023.  Flu 02/2023.  Pneumovax 07/2020.  COVID Moderna x 3.  HLD with CAD status post MI: His last LDL was 82, triglycerides 440, 02/2023.  He denies myalgias on atorvastatin .  He is taking fenofibrate , isosorbide , carvedilol  and aspirin as well.  He does not follow with cardiology.  HTN: His BP today is 124/72.  He is taking amlodipine , benazepril  and carvedilol  as prescribed.  ECG from 04/2020 reviewed.  GERD: Triggered by tomato based sauces.  He denies breakthrough on pantoprazole .  There is no upper GI on file.  Anxiety and depression: Chronic, managed on fluoxetine  buproprion and hydroxyzine . He does feel like he needs a dose adjustment.  He is not currently seeing a therapist.  He denies SI/HI.  Chronic low back pain: Managed with tylenol  OTC. He wears a brace with some relief of symptoms. He does not follow with orthopedics.  Chronic knee pain: bilateral. He takes tylenol  OTC with some relief of symptoms. He reports one leg is longer than the other. He does not follow with orthopedics.  Review of Systems     Past Medical History:  Diagnosis Date   Anxiety    Depression    Diabetes mellitus without complication (HCC)    GERD (gastroesophageal reflux disease)    Hyperlipidemia    Hypertension    Migraines    Myocardial infarction (HCC)    3 Stents   Sleep apnea    Stroke [facility])     Current Outpatient Medications  Medication Sig Dispense Refill   acetaminophen  (TYLENOL ) 500 MG tablet Take 1,000 mg by mouth every 6 (six) hours as needed.     amLODipine  (NORVASC ) 10 MG  tablet TAKE 1 TABLET(10 MG) BY MOUTH DAILY 90 tablet 1   aspirin EC 81 MG tablet Take 81 mg by mouth daily.     atorvastatin  (LIPITOR) 40 MG tablet TAKE 1 TABLET(40 MG) BY MOUTH DAILY 90 tablet 1   benazepril  (LOTENSIN ) 40 MG tablet TAKE 1 TABLET(40 MG) BY MOUTH DAILY 90 tablet 1   buPROPion  (WELLBUTRIN  XL) 150 MG 24 hr tablet Take 1 tablet (150 mg total) by mouth daily. 90 tablet 1   carvedilol  (COREG ) 25 MG tablet TAKE 1 TABLET(25 MG) BY MOUTH TWICE DAILY WITH A MEAL 180 tablet 1   fenofibrate  (TRICOR ) 145 MG tablet TAKE 1 TABLET(145 MG) BY MOUTH DAILY 90 tablet 1   fexofenadine  (ALLEGRA ) 180 MG tablet Take 1 tablet (180 mg total) by mouth daily. 90 tablet 3   FLUoxetine  (PROZAC ) 40 MG capsule TAKE 1 CAPSULE(40 MG) BY MOUTH DAILY 90 capsule 1   fluticasone  (FLONASE ) 50 MCG/ACT nasal spray 2 sprays in each nostril daily 16 g 3   hydrOXYzine  (ATARAX ) 25 MG tablet TAKE 2 TABLETS BY MOUTH EVERY NIGHT AT BEDTIME. MAY ALSO TAKE 1/2- 1 TABLET 2 TIMES DAILY AS NEEDED FOR ANXIETY 270 tablet 1   isosorbide  mononitrate (IMDUR ) 120 MG 24 hr tablet TAKE 1 TABLET(120 MG) BY MOUTH DAILY 90 tablet 0   naproxen  (NAPROSYN ) 500 MG tablet  Take 1 tablet (500 mg total) by mouth 2 (two) times daily with a meal. For 2-4 weeks then as needed 30 tablet 0   nitroGLYCERIN  (NITROSTAT ) 0.4 MG SL tablet PLACE 1 TABLET UNDER THE TONGUE EVERY 5 MINUTES AS NEEDED FOR CHEST PAIN. MAY TAKE UP TO 3 DOSES 25 tablet 1   pantoprazole  (PROTONIX ) 20 MG tablet TAKE 1 TABLET(20 MG) BY MOUTH DAILY 90 tablet 1   No current facility-administered medications for this visit.    Allergies  Allergen Reactions   Buprenorphine Hcl     Other reaction(s): Other (See Comments), Other (See Comments)   Morphine And Codeine    Sulfa Antibiotics Rash    Family History  Problem Relation Age of Onset   COPD Mother    Hypertension Mother    Hyperlipidemia Mother    Depression Mother    Heart disease Mother    COPD Father    Diabetes Father     Heart disease Father    Stroke Father    Cancer Father        pelvic mass   Lung cancer Father    Depression Father    Diabetes Paternal Grandmother     Social History   Socioeconomic History   Marital status: Single    Spouse name: Not on file   Number of children: Not on file   Years of education: Not on file   Highest education level: Not on file  Occupational History   Not on file  Tobacco Use   Smoking status: Every Day    Current packs/day: 0.00    Types: Cigarettes    Last attempt to quit: 04/13/1993    Years since quitting: 30.3   Smokeless tobacco: Never   Tobacco comments:    Has quit off and on about 5-6 times (09/2017)  Vaping Use   Vaping status: Never Used  Substance and Sexual Activity   Alcohol use: No   Drug use: No   Sexual activity: Not Currently  Other Topics Concern   Not on file  Social History Narrative   Not on file   Social Drivers of Health   Financial Resource Strain: Not on file  Food Insecurity: Not on file  Transportation Needs: Not on file  Physical Activity: Not on file  Stress: Not on file  Social Connections: Not on file  Intimate Partner Violence: Not on file     Constitutional: Denies fever, malaise, fatigue, headache or abrupt weight changes.  HEENT: Denies eye pain, eye redness, ear pain, ringing in the ears, wax buildup, runny nose, nasal congestion, bloody nose, or sore throat. Respiratory: Denies difficulty breathing, shortness of breath, cough or sputum production.   Cardiovascular: Denies chest pain, chest tightness, palpitations or swelling in the hands or feet.  Gastrointestinal: Denies abdominal pain, bloating, constipation, diarrhea or blood in the stool.  GU: Denies urgency, frequency, pain with urination, burning sensation, blood in urine, odor or discharge. Musculoskeletal: Pt reports knee pain, intermittent low back pain. Denies decrease in range of motion, difficulty with gait, muscle pain or joint  swelling.  Skin: Denies redness, rashes, lesions or ulcercations.  Neurological: Pt reports paresthesia of right hand. Denies dizziness, difficulty with memory, difficulty with speech or problems with balance and coordination.  Psych: Patient has a history of anxiety and depression.  Denies SI/HI.  No other specific complaints in a complete review of systems (except as listed in HPI above).  Objective:   Physical Exam BP 124/72 (BP Location:  Left Arm, Patient Position: Sitting, Cuff Size: Normal)   Ht 5\' 5"  (1.651 m)   Wt 185 lb 6.4 oz (84.1 kg)   BMI 30.85 kg/m    Wt Readings from Last 3 Encounters:  05/31/23 178 lb (80.7 kg)  02/19/23 191 lb (86.6 kg)  10/15/22 189 lb (85.7 kg)    General: Appears his stated age, obese, in NAD. Skin: Warm, dry and intact. No ulcerations noted. HEENT: Head: normal shape and size; Eyes: sclera white, no icterus, conjunctiva pink, PERRLA and EOMs intact;  Cardiovascular: Normal rate and rhythm. S1,S2 noted.  No murmur, rubs or gallops noted. No JVD or BLE edema. No carotid bruits noted. Pulmonary/Chest: Normal effort and positive vesicular breath sounds. No respiratory distress. No wheezes, rales or ronchi noted.  Abdomen: Soft and nontender. Normal bowel sounds.  Musculoskeletal:  Normal flexion and extension bilateral knees. No joint swelling noted. Wearing back brace today. No difficulty with gait.  Neurological: Alert and oriented. Coordination normal.  Psychiatric: Mood and affect normal. Behavior is normal. Judgment and thought content normal.    BMET    Component Value Date/Time   NA 137 02/19/2023 1521   NA 138 12/10/2016 1902   NA 140 02/03/2012 0313   K 4.3 02/19/2023 1521   K 3.5 02/03/2012 0313   CL 105 02/19/2023 1521   CL 106 02/03/2012 0313   CO2 26 02/19/2023 1521   CO2 22 02/03/2012 0313   GLUCOSE 111 (H) 02/19/2023 1521   GLUCOSE 151 (H) 02/03/2012 0313   BUN 16 02/19/2023 1521   BUN 15 12/10/2016 1902   BUN 9  02/03/2012 0313   CREATININE 1.19 02/19/2023 1521   CALCIUM  9.3 02/19/2023 1521   CALCIUM  9.2 02/03/2012 0313   GFRNONAA >60 05/07/2020 1612   GFRNONAA 66 08/17/2019 0948   GFRAA 77 08/17/2019 0948    Lipid Panel     Component Value Date/Time   CHOL 143 02/19/2023 1521   CHOL 139 12/10/2016 1902   TRIG 134 02/19/2023 1521   HDL 38 (L) 02/19/2023 1521   HDL 22 (L) 12/10/2016 1902   CHOLHDL 3.8 02/19/2023 1521   LDLCALC 82 02/19/2023 1521    CBC    Component Value Date/Time   WBC 9.1 02/19/2023 1521   RBC 5.27 02/19/2023 1521   HGB 15.4 02/19/2023 1521   HGB 14.9 04/02/2016 1939   HCT 46.9 02/19/2023 1521   HCT 43.6 04/02/2016 1939   PLT 341 02/19/2023 1521   PLT 268 04/02/2016 1939   MCV 89.0 02/19/2023 1521   MCV 86 04/02/2016 1939   MCV 87 02/03/2012 0313   MCH 29.2 02/19/2023 1521   MCHC 32.8 02/19/2023 1521   RDW 13.1 02/19/2023 1521   RDW 13.3 04/02/2016 1939   RDW 13.0 02/03/2012 0313   LYMPHSABS 3,717 08/17/2019 0948   LYMPHSABS 3.3 (H) 04/02/2016 1939   EOSABS 351 08/17/2019 0948   EOSABS 0.3 04/02/2016 1939   BASOSABS 99 08/17/2019 0948   BASOSABS 0.1 04/02/2016 1939    Hgb A1C Lab Results  Component Value Date   HGBA1C 5.8 (H) 02/19/2023           Assessment & Plan:   RTC in 6 months for your annual exam Helayne Lo, NP

## 2023-08-20 NOTE — Assessment & Plan Note (Signed)
Encourage low-fat diet C-Met and lipid profile today Continue atorvastatin, fenofibrate

## 2023-08-21 LAB — COMPREHENSIVE METABOLIC PANEL WITH GFR
AG Ratio: 1.9 (calc) (ref 1.0–2.5)
ALT: 20 U/L (ref 9–46)
AST: 16 U/L (ref 10–35)
Albumin: 4.1 g/dL (ref 3.6–5.1)
Alkaline phosphatase (APISO): 85 U/L (ref 35–144)
BUN: 15 mg/dL (ref 7–25)
CO2: 25 mmol/L (ref 20–32)
Calcium: 9 mg/dL (ref 8.6–10.3)
Chloride: 105 mmol/L (ref 98–110)
Creat: 1.14 mg/dL (ref 0.70–1.30)
Globulin: 2.2 g/dL (ref 1.9–3.7)
Glucose, Bld: 119 mg/dL — ABNORMAL HIGH (ref 65–99)
Potassium: 4.5 mmol/L (ref 3.5–5.3)
Sodium: 136 mmol/L (ref 135–146)
Total Bilirubin: 0.3 mg/dL (ref 0.2–1.2)
Total Protein: 6.3 g/dL (ref 6.1–8.1)
eGFR: 78 mL/min/{1.73_m2} (ref >=60)

## 2023-08-21 LAB — LIPID PANEL
Cholesterol: 131 mg/dL (ref <200)
HDL: 39 mg/dL — ABNORMAL LOW (ref >=40)
LDL Cholesterol (Calc): 72 mg/dL
Non-HDL Cholesterol (Calc): 92 mg/dL (ref <130)
Total CHOL/HDL Ratio: 3.4 (calc) (ref <5.0)
Triglycerides: 122 mg/dL (ref <150)

## 2023-08-21 LAB — CBC
HCT: 47 % (ref 38.5–50.0)
Hemoglobin: 15.1 g/dL (ref 13.2–17.1)
MCH: 27.9 pg (ref 27.0–33.0)
MCHC: 32.1 g/dL (ref 32.0–36.0)
MCV: 86.7 fL (ref 80.0–100.0)
MPV: 9.7 fL (ref 7.5–12.5)
Platelets: 264 10*3/uL (ref 140–400)
RBC: 5.42 10*6/uL (ref 4.20–5.80)
RDW: 12.9 % (ref 11.0–15.0)
WBC: 9 10*3/uL (ref 3.8–10.8)

## 2023-08-21 LAB — HEMOGLOBIN A1C
Hgb A1c MFr Bld: 5.7 % — ABNORMAL HIGH (ref <5.7)
Mean Plasma Glucose: 117 mg/dL
eAG (mmol/L): 6.5 mmol/L

## 2023-08-23 ENCOUNTER — Encounter: Payer: Self-pay | Admitting: Internal Medicine

## 2023-08-24 ENCOUNTER — Other Ambulatory Visit: Payer: Self-pay | Admitting: Internal Medicine

## 2023-08-24 ENCOUNTER — Ambulatory Visit
Admission: RE | Admit: 2023-08-24 | Discharge: 2023-08-24 | Disposition: A | Discharge status: Home / Self Care | Source: Ambulatory Visit | Attending: Internal Medicine | Admitting: Internal Medicine

## 2023-08-24 ENCOUNTER — Ambulatory Visit
Admission: RE | Admit: 2023-08-24 | Discharge: 2023-08-24 | Disposition: A | Discharge status: Home / Self Care | Attending: Internal Medicine | Admitting: Internal Medicine

## 2023-08-24 DIAGNOSIS — M1712 Unilateral primary osteoarthritis, left knee: Secondary | ICD-10-CM | POA: Diagnosis not present

## 2023-08-24 DIAGNOSIS — M25561 Pain in right knee: Secondary | ICD-10-CM | POA: Insufficient documentation

## 2023-08-24 DIAGNOSIS — M1711 Unilateral primary osteoarthritis, right knee: Secondary | ICD-10-CM | POA: Diagnosis not present

## 2023-08-24 DIAGNOSIS — M25562 Pain in left knee: Secondary | ICD-10-CM | POA: Diagnosis not present

## 2023-08-24 DIAGNOSIS — G8929 Other chronic pain: Secondary | ICD-10-CM | POA: Diagnosis not present

## 2023-08-27 ENCOUNTER — Encounter: Payer: Self-pay | Admitting: Internal Medicine

## 2023-09-12 ENCOUNTER — Other Ambulatory Visit: Payer: Self-pay

## 2023-09-12 ENCOUNTER — Emergency Department

## 2023-09-12 ENCOUNTER — Inpatient Hospital Stay
Admission: EM | Admit: 2023-09-12 | Discharge: 2023-09-14 | DRG: 251 | Disposition: A | Discharge status: Home / Self Care | Attending: Obstetrics and Gynecology | Admitting: Obstetrics and Gynecology

## 2023-09-12 DIAGNOSIS — I2510 Atherosclerotic heart disease of native coronary artery without angina pectoris: Secondary | ICD-10-CM | POA: Diagnosis not present

## 2023-09-12 DIAGNOSIS — Z801 Family history of malignant neoplasm of trachea, bronchus and lung: Secondary | ICD-10-CM

## 2023-09-12 DIAGNOSIS — Z885 Allergy status to narcotic agent status: Secondary | ICD-10-CM

## 2023-09-12 DIAGNOSIS — Z833 Family history of diabetes mellitus: Secondary | ICD-10-CM

## 2023-09-12 DIAGNOSIS — I214 Non-ST elevation (NSTEMI) myocardial infarction: Secondary | ICD-10-CM | POA: Diagnosis present

## 2023-09-12 DIAGNOSIS — Z8673 Personal history of transient ischemic attack (TIA), and cerebral infarction without residual deficits: Secondary | ICD-10-CM

## 2023-09-12 DIAGNOSIS — Z7902 Long term (current) use of antithrombotics/antiplatelets: Secondary | ICD-10-CM

## 2023-09-12 DIAGNOSIS — Z955 Presence of coronary angioplasty implant and graft: Secondary | ICD-10-CM

## 2023-09-12 DIAGNOSIS — Z818 Family history of other mental and behavioral disorders: Secondary | ICD-10-CM

## 2023-09-12 DIAGNOSIS — Z823 Family history of stroke: Secondary | ICD-10-CM

## 2023-09-12 DIAGNOSIS — F17210 Nicotine dependence, cigarettes, uncomplicated: Secondary | ICD-10-CM | POA: Diagnosis present

## 2023-09-12 DIAGNOSIS — Z6832 Body mass index (BMI) 32.0-32.9, adult: Secondary | ICD-10-CM

## 2023-09-12 DIAGNOSIS — Z882 Allergy status to sulfonamides status: Secondary | ICD-10-CM

## 2023-09-12 DIAGNOSIS — Z825 Family history of asthma and other chronic lower respiratory diseases: Secondary | ICD-10-CM

## 2023-09-12 DIAGNOSIS — Z716 Tobacco abuse counseling: Secondary | ICD-10-CM

## 2023-09-12 DIAGNOSIS — G4733 Obstructive sleep apnea (adult) (pediatric): Secondary | ICD-10-CM | POA: Insufficient documentation

## 2023-09-12 DIAGNOSIS — R079 Chest pain, unspecified: Secondary | ICD-10-CM | POA: Diagnosis not present

## 2023-09-12 DIAGNOSIS — E119 Type 2 diabetes mellitus without complications: Secondary | ICD-10-CM

## 2023-09-12 DIAGNOSIS — Z8249 Family history of ischemic heart disease and other diseases of the circulatory system: Secondary | ICD-10-CM

## 2023-09-12 DIAGNOSIS — K219 Gastro-esophageal reflux disease without esophagitis: Secondary | ICD-10-CM | POA: Diagnosis present

## 2023-09-12 DIAGNOSIS — I10 Essential (primary) hypertension: Secondary | ICD-10-CM | POA: Diagnosis present

## 2023-09-12 DIAGNOSIS — Z7982 Long term (current) use of aspirin: Secondary | ICD-10-CM

## 2023-09-12 DIAGNOSIS — A419 Sepsis, unspecified organism: Secondary | ICD-10-CM | POA: Diagnosis not present

## 2023-09-12 DIAGNOSIS — Z79899 Other long term (current) drug therapy: Secondary | ICD-10-CM

## 2023-09-12 DIAGNOSIS — I2129 ST elevation (STEMI) myocardial infarction involving other sites: Principal | ICD-10-CM | POA: Diagnosis present

## 2023-09-12 DIAGNOSIS — R0789 Other chest pain: Secondary | ICD-10-CM | POA: Diagnosis not present

## 2023-09-12 DIAGNOSIS — I252 Old myocardial infarction: Secondary | ICD-10-CM

## 2023-09-12 DIAGNOSIS — E66811 Obesity, class 1: Secondary | ICD-10-CM

## 2023-09-12 DIAGNOSIS — E7849 Other hyperlipidemia: Secondary | ICD-10-CM

## 2023-09-12 DIAGNOSIS — I213 ST elevation (STEMI) myocardial infarction of unspecified site: Secondary | ICD-10-CM | POA: Diagnosis present

## 2023-09-12 DIAGNOSIS — E785 Hyperlipidemia, unspecified: Secondary | ICD-10-CM | POA: Diagnosis present

## 2023-09-12 DIAGNOSIS — Z951 Presence of aortocoronary bypass graft: Secondary | ICD-10-CM

## 2023-09-12 DIAGNOSIS — Z83438 Family history of other disorder of lipoprotein metabolism and other lipidemia: Secondary | ICD-10-CM

## 2023-09-12 DIAGNOSIS — F17200 Nicotine dependence, unspecified, uncomplicated: Secondary | ICD-10-CM | POA: Insufficient documentation

## 2023-09-12 DIAGNOSIS — Z6830 Body mass index (BMI) 30.0-30.9, adult: Secondary | ICD-10-CM

## 2023-09-12 LAB — BASIC METABOLIC PANEL WITH GFR
Anion gap: 10 (ref 5–15)
BUN: 13 mg/dL (ref 6–20)
CO2: 23 mmol/L (ref 22–32)
Calcium: 8 mg/dL — ABNORMAL LOW (ref 8.9–10.3)
Chloride: 100 mmol/L (ref 98–111)
Creatinine, Ser: 1.25 mg/dL — ABNORMAL HIGH (ref 0.61–1.24)
GFR, Estimated: >60 mL/min (ref >60)
Glucose, Bld: 98 mg/dL (ref 70–99)
Potassium: 3.8 mmol/L (ref 3.5–5.1)
Sodium: 133 mmol/L — ABNORMAL LOW (ref 135–145)

## 2023-09-12 LAB — CBC
HCT: 44.4 % (ref 39.0–52.0)
Hemoglobin: 15.4 g/dL (ref 13.0–17.0)
MCH: 28.5 pg (ref 26.0–34.0)
MCHC: 34.7 g/dL (ref 30.0–36.0)
MCV: 82.1 fL (ref 80.0–100.0)
Platelets: 251 10*3/uL (ref 150–400)
RBC: 5.41 MIL/uL (ref 4.22–5.81)
RDW: 12.8 % (ref 11.5–15.5)
WBC: 12 10*3/uL — ABNORMAL HIGH (ref 4.0–10.5)
nRBC: 0 % (ref 0.0–0.2)

## 2023-09-12 LAB — TROPONIN I (HIGH SENSITIVITY): Troponin I (High Sensitivity): 9 ng/L (ref <18)

## 2023-09-12 MED ORDER — NITROGLYCERIN 0.4 MG SL SUBL
0.4000 mg | SUBLINGUAL_TABLET | Freq: Once | SUBLINGUAL | Status: AC
  Administered 2023-09-12: 0.4 mg via SUBLINGUAL
  Filled 2023-09-12: qty 1

## 2023-09-12 MED ORDER — FENTANYL CITRATE PF 50 MCG/ML IJ SOSY
50.0000 ug | PREFILLED_SYRINGE | Freq: Once | INTRAMUSCULAR | Status: AC
  Administered 2023-09-12: 50 ug via INTRAVENOUS
  Filled 2023-09-12: qty 1

## 2023-09-12 MED ORDER — IOHEXOL 350 MG/ML SOLN
75.0000 mL | Freq: Once | INTRAVENOUS | Status: AC | PRN
  Administered 2023-09-12: 75 mL via INTRAVENOUS

## 2023-09-12 NOTE — ED Provider Notes (Addendum)
 [facility] Provider Note    Event Date/Time   First MD Initiated Contact with Patient 09/12/23 2207     (approximate)   History   Chest Pain   HPI  Calvin Ortiz is a 53 y.o. male with a history of anxiety, diabetes, hypertension, CAD status post stents who presents with complaints of chest pain.  Patient describes severe left-sided chest pain that started around 7 PM.  He reports he took nitroglycerin  with improvement but pain returned.  Review of medical records demonstrates the patient last saw [facility] cardiology in 2018.     Physical Exam   Triage Vital Signs: ED Triage Vitals  Encounter Vitals Group     BP 09/12/23 2215 (!) 171/115     Systolic BP Percentile --      Diastolic BP Percentile --      Pulse Rate 09/12/23 2215 65     Resp 09/12/23 2215 (!) 21     Temp 09/12/23 2215 97.8 F (36.6 C)     Temp Source 09/12/23 2215 Oral     SpO2 09/12/23 2215 100 %     Weight 09/12/23 2221 80.7 kg (178 lb)     Height --      Head Circumference --      Peak Flow --      Pain Score 09/12/23 2218 10     Pain Loc --      Pain Education --      Exclude from Growth Chart --     Most recent vital signs: Vitals:   09/14/23 1300 09/14/23 1400  BP: 139/89 (!) 139/91  Pulse: 62 65  Resp: 17 19  Temp:    SpO2: 97% 97%     General: Awake, quite anxious, mildly diaphoretic CV:  Good peripheral perfusion.  Regular rate and rhythm Resp:  Normal effort.  Clear to auscultation Abd:  No distention.  Other:  No calf pain or swelling   ED Results / Procedures / Treatments   Labs (all labs ordered are listed, but only abnormal results are displayed) Labs Reviewed  BASIC METABOLIC PANEL WITH GFR - Abnormal; Notable for the following components:      Result Value   Sodium 133 (*)    Creatinine, Ser 1.25 (*)    Calcium  8.0 (*)    All other components within normal limits  CBC - Abnormal; Notable for the following components:   WBC 12.0 (*)     All other components within normal limits  HEPARIN  LEVEL (UNFRACTIONATED) - Abnormal; Notable for the following components:   Heparin  Unfractionated 1.05 (*)    All other components within normal limits  BASIC METABOLIC PANEL WITH GFR - Abnormal; Notable for the following components:   Calcium  8.8 (*)    All other components within normal limits  GLUCOSE, CAPILLARY - Abnormal; Notable for the following components:   Glucose-Capillary 104 (*)    All other components within normal limits  I-STAT CG4 LACTIC ACID, ED - Abnormal; Notable for the following components:   Lactic Acid, Venous 0.4 (*)    All other components within normal limits  TROPONIN I (HIGH SENSITIVITY) - Abnormal; Notable for the following components:   Troponin I (High Sensitivity) 84 (*)    All other components within normal limits  MRSA NEXT GEN BY PCR, NASAL  HIV ANTIBODY (ROUTINE TESTING W REFLEX)  LIPOPROTEIN A (LPA)  TSH  CBC  POCT ACTIVATED CLOTTING TIME  TROPONIN I (HIGH SENSITIVITY)  EKG  ED ECG REPORT I, Bryson Carbine, the attending physician, personally viewed and interpreted this ECG.  Date: 09/12/2023  Rhythm: normal sinus rhythm QRS Axis: normal Intervals: normal ST/T Wave abnormalities: normal Narrative Interpretation: no evidence of acute ischemia    RADIOLOGY Chest x-ray viewed to read by me, no acute abnormality    PROCEDURES:  Critical Care performed: yes  CRITICAL CARE Performed by: Bryson Carbine   Total critical care time: 30 minutes  Critical care time was exclusive of separately billable procedures and treating other patients.  Critical care was necessary to treat or prevent imminent or life-threatening deterioration.  Critical care was time spent personally by me on the following activities: development of treatment plan with patient and/or surrogate as well as nursing, discussions with consultants, evaluation of patient's response to treatment, examination of  patient, obtaining history from patient or surrogate, ordering and performing treatments and interventions, ordering and review of laboratory studies, ordering and review of radiographic studies, pulse oximetry and re-evaluation of patient's condition.   Procedures   MEDICATIONS ORDERED IN ED: Medications  0.9% sodium chloride  infusion (0 mL/kg/hr  80.7 kg Intravenous Stopped 09/13/23 1715)  fentaNYL  (SUBLIMAZE ) injection 50 mcg (50 mcg Intravenous Given 09/12/23 2232)  nitroGLYCERIN  (NITROSTAT ) SL tablet 0.4 mg (0.4 mg Sublingual Given 09/12/23 2232)  iohexol  (OMNIPAQUE ) 350 MG/ML injection 75 mL (75 mLs Intravenous Contrast Given 09/12/23 2337)  fentaNYL  (SUBLIMAZE ) injection 25 mcg (25 mcg Intravenous Given 09/13/23 0037)  heparin  bolus via infusion 4,000 Units (4,000 Units Intravenous Bolus from Bag 09/13/23 0207)  clopidogrel  (PLAVIX ) tablet 300 mg (300 mg Oral Given 09/14/23 0846)     IMPRESSION / MDM / ASSESSMENT AND PLAN / ED COURSE  I reviewed the triage vital signs and the nursing notes. Patient's presentation is most consistent with acute presentation with potential threat to life or bodily function.  Patient with a history of CAD presents with severe chest pain as detailed above.  Differential includes ACS, angina, pneumothorax, pneumonia  EKG is overall reassuring, pending high sensitive troponin, x-ray  High sensitive troponin is normal, x-ray is reassuring.  Given the severity of his pain, question acute aortic syndrome will send for CT angiography  Have asked my colleague to follow-up on CT results and second troponin and to reassess for disposition        FINAL CLINICAL IMPRESSION(S) / ED DIAGNOSES   Final diagnoses:  Chest pain, unspecified type     Rx / DC Orders   ED Discharge Orders          Ordered    atorvastatin  (LIPITOR ) 80 MG tablet  Daily        09/14/23 1419    Increase activity slowly        09/14/23 1419    Diet - low sodium heart healthy         09/14/23 1419    clopidogrel  (PLAVIX ) 75 MG tablet  Daily        09/14/23 1419    AMB Referral to Cardiac Rehabilitation - Phase II        09/13/23 0534             Note:  This document was prepared using Dragon voice recognition software and may include unintentional dictation errors.   Bryson Carbine, MD 09/12/23 2310    Bryson Carbine, MD 09/20/23 [phone]

## 2023-09-12 NOTE — ED Triage Notes (Signed)
 Coming from home by EMS, Chest pain 10 out of 10, started today at 1900, feels like burn.  Pt took 4 doses of 5mg  of nitroglycerin , report relief with them. Also 4 doses of 81mg  Aspirin.   Pt alert on triage, Hx of tremors, Hearth attack and acid reflex.   Vital signs with [place]: 60HR, 100%RA, 156/67.

## 2023-09-13 ENCOUNTER — Encounter
Admission: EM | Disposition: A | Discharge status: Home / Self Care | Payer: Self-pay | Source: Home / Self Care | Attending: Obstetrics and Gynecology

## 2023-09-13 DIAGNOSIS — Z8673 Personal history of transient ischemic attack (TIA), and cerebral infarction without residual deficits: Secondary | ICD-10-CM

## 2023-09-13 DIAGNOSIS — R079 Chest pain, unspecified: Secondary | ICD-10-CM | POA: Diagnosis not present

## 2023-09-13 DIAGNOSIS — I2129 ST elevation (STEMI) myocardial infarction involving other sites: Secondary | ICD-10-CM | POA: Diagnosis not present

## 2023-09-13 DIAGNOSIS — Z716 Tobacco abuse counseling: Secondary | ICD-10-CM | POA: Diagnosis not present

## 2023-09-13 DIAGNOSIS — I214 Non-ST elevation (NSTEMI) myocardial infarction: Secondary | ICD-10-CM | POA: Diagnosis not present

## 2023-09-13 DIAGNOSIS — G4733 Obstructive sleep apnea (adult) (pediatric): Secondary | ICD-10-CM | POA: Diagnosis not present

## 2023-09-13 DIAGNOSIS — E785 Hyperlipidemia, unspecified: Secondary | ICD-10-CM | POA: Diagnosis not present

## 2023-09-13 DIAGNOSIS — Z955 Presence of coronary angioplasty implant and graft: Secondary | ICD-10-CM | POA: Diagnosis not present

## 2023-09-13 DIAGNOSIS — F17200 Nicotine dependence, unspecified, uncomplicated: Secondary | ICD-10-CM | POA: Insufficient documentation

## 2023-09-13 DIAGNOSIS — I10 Essential (primary) hypertension: Secondary | ICD-10-CM | POA: Diagnosis not present

## 2023-09-13 DIAGNOSIS — F17210 Nicotine dependence, cigarettes, uncomplicated: Secondary | ICD-10-CM | POA: Diagnosis not present

## 2023-09-13 DIAGNOSIS — R0789 Other chest pain: Secondary | ICD-10-CM | POA: Diagnosis not present

## 2023-09-13 DIAGNOSIS — Z83438 Family history of other disorder of lipoprotein metabolism and other lipidemia: Secondary | ICD-10-CM | POA: Diagnosis not present

## 2023-09-13 DIAGNOSIS — Z833 Family history of diabetes mellitus: Secondary | ICD-10-CM | POA: Diagnosis not present

## 2023-09-13 DIAGNOSIS — Z818 Family history of other mental and behavioral disorders: Secondary | ICD-10-CM | POA: Diagnosis not present

## 2023-09-13 DIAGNOSIS — E66811 Obesity, class 1: Secondary | ICD-10-CM | POA: Diagnosis not present

## 2023-09-13 DIAGNOSIS — I213 ST elevation (STEMI) myocardial infarction of unspecified site: Secondary | ICD-10-CM | POA: Diagnosis not present

## 2023-09-13 DIAGNOSIS — E119 Type 2 diabetes mellitus without complications: Secondary | ICD-10-CM | POA: Diagnosis not present

## 2023-09-13 DIAGNOSIS — I2510 Atherosclerotic heart disease of native coronary artery without angina pectoris: Secondary | ICD-10-CM | POA: Diagnosis not present

## 2023-09-13 DIAGNOSIS — Z801 Family history of malignant neoplasm of trachea, bronchus and lung: Secondary | ICD-10-CM | POA: Diagnosis not present

## 2023-09-13 DIAGNOSIS — Z79899 Other long term (current) drug therapy: Secondary | ICD-10-CM | POA: Diagnosis not present

## 2023-09-13 DIAGNOSIS — Z8249 Family history of ischemic heart disease and other diseases of the circulatory system: Secondary | ICD-10-CM | POA: Diagnosis not present

## 2023-09-13 DIAGNOSIS — I252 Old myocardial infarction: Secondary | ICD-10-CM | POA: Diagnosis not present

## 2023-09-13 DIAGNOSIS — Z823 Family history of stroke: Secondary | ICD-10-CM | POA: Diagnosis not present

## 2023-09-13 DIAGNOSIS — Z7902 Long term (current) use of antithrombotics/antiplatelets: Secondary | ICD-10-CM | POA: Diagnosis not present

## 2023-09-13 DIAGNOSIS — Z951 Presence of aortocoronary bypass graft: Secondary | ICD-10-CM | POA: Diagnosis not present

## 2023-09-13 DIAGNOSIS — I25119 Atherosclerotic heart disease of native coronary artery with unspecified angina pectoris: Secondary | ICD-10-CM | POA: Diagnosis not present

## 2023-09-13 DIAGNOSIS — Z7982 Long term (current) use of aspirin: Secondary | ICD-10-CM | POA: Diagnosis not present

## 2023-09-13 DIAGNOSIS — I2119 ST elevation (STEMI) myocardial infarction involving other coronary artery of inferior wall: Secondary | ICD-10-CM | POA: Diagnosis not present

## 2023-09-13 DIAGNOSIS — A419 Sepsis, unspecified organism: Secondary | ICD-10-CM | POA: Diagnosis not present

## 2023-09-13 DIAGNOSIS — K219 Gastro-esophageal reflux disease without esophagitis: Secondary | ICD-10-CM | POA: Diagnosis not present

## 2023-09-13 DIAGNOSIS — Z6832 Body mass index (BMI) 32.0-32.9, adult: Secondary | ICD-10-CM | POA: Diagnosis not present

## 2023-09-13 HISTORY — PX: LEFT HEART CATH AND CORONARY ANGIOGRAPHY: CATH118249

## 2023-09-13 HISTORY — PX: CORONARY/GRAFT ACUTE MI REVASCULARIZATION: CATH118305

## 2023-09-13 HISTORY — PX: CORONARY BALLOON ANGIOPLASTY: CATH118233

## 2023-09-13 LAB — POCT ACTIVATED CLOTTING TIME: Activated Clotting Time: 348 s

## 2023-09-13 LAB — CARDIAC CATHETERIZATION: Cath EF Quantitative: 50 %

## 2023-09-13 LAB — HEPARIN LEVEL (UNFRACTIONATED): Heparin Unfractionated: 1.05 [IU]/mL — ABNORMAL HIGH (ref 0.30–0.70)

## 2023-09-13 LAB — HIV ANTIBODY (ROUTINE TESTING W REFLEX): HIV Screen 4th Generation wRfx: NONREACTIVE

## 2023-09-13 LAB — TROPONIN I (HIGH SENSITIVITY): Troponin I (High Sensitivity): 84 ng/L — ABNORMAL HIGH (ref <18)

## 2023-09-13 LAB — TSH: TSH: 0.953 u[IU]/mL (ref 0.350–4.500)

## 2023-09-13 LAB — I-STAT CG4 LACTIC ACID, ED: Lactic Acid, Venous: 0.4 mmol/L — ABNORMAL LOW (ref 0.5–1.9)

## 2023-09-13 LAB — MRSA NEXT GEN BY PCR, NASAL: MRSA by PCR Next Gen: NOT DETECTED

## 2023-09-13 SURGERY — CORONARY/GRAFT ACUTE MI REVASCULARIZATION
Anesthesia: Moderate Sedation

## 2023-09-13 MED ORDER — FENTANYL CITRATE PF 50 MCG/ML IJ SOSY
25.0000 ug | PREFILLED_SYRINGE | Freq: Once | INTRAMUSCULAR | Status: AC
  Administered 2023-09-13: 25 ug via INTRAVENOUS
  Filled 2023-09-13: qty 1

## 2023-09-13 MED ORDER — FENTANYL CITRATE PF 50 MCG/ML IJ SOSY
50.0000 ug | PREFILLED_SYRINGE | INTRAMUSCULAR | Status: DC | PRN
  Administered 2023-09-13: 50 ug via INTRAVENOUS

## 2023-09-13 MED ORDER — LIDOCAINE HCL (PF) 1 % IJ SOLN
INTRAMUSCULAR | Status: DC | PRN
  Administered 2023-09-13: 2 mL

## 2023-09-13 MED ORDER — HEPARIN (PORCINE) IN NACL 1000-0.9 UT/500ML-% IV SOLN
INTRAVENOUS | Status: DC | PRN
  Administered 2023-09-13 (×2): 500 mL

## 2023-09-13 MED ORDER — MIDAZOLAM HCL 2 MG/2ML IJ SOLN
INTRAMUSCULAR | Status: AC
  Filled 2023-09-13: qty 2

## 2023-09-13 MED ORDER — FENTANYL CITRATE (PF) 100 MCG/2ML IJ SOLN
INTRAMUSCULAR | Status: DC | PRN
  Administered 2023-09-13: 25 ug via INTRAVENOUS

## 2023-09-13 MED ORDER — HEPARIN SODIUM (PORCINE) 1000 UNIT/ML IJ SOLN
INTRAMUSCULAR | Status: DC | PRN
  Administered 2023-09-13: 4000 [IU] via INTRAVENOUS
  Administered 2023-09-13: 8000 [IU] via INTRAVENOUS

## 2023-09-13 MED ORDER — FENTANYL CITRATE (PF) 100 MCG/2ML IJ SOLN
INTRAMUSCULAR | Status: AC
  Filled 2023-09-13: qty 2

## 2023-09-13 MED ORDER — ATORVASTATIN CALCIUM 20 MG PO TABS: 40.0000 mg | ORAL_TABLET | Freq: Every day | ORAL | Status: DC

## 2023-09-13 MED ORDER — VERAPAMIL HCL 2.5 MG/ML IV SOLN
INTRAVENOUS | Status: DC | PRN
  Administered 2023-09-13: 2.5 mg via INTRAVENOUS

## 2023-09-13 MED ORDER — AMLODIPINE BESYLATE 10 MG PO TABS
10.0000 mg | ORAL_TABLET | Freq: Every day | ORAL | Status: DC
  Administered 2023-09-13 – 2023-09-14 (×2): 10 mg via ORAL
  Filled 2023-09-13 (×2): qty 1

## 2023-09-13 MED ORDER — HYDROCODONE-ACETAMINOPHEN 5-325 MG PO TABS
1.0000 | ORAL_TABLET | Freq: Four times a day (QID) | ORAL | Status: DC | PRN
  Administered 2023-09-13: 1 via ORAL
  Filled 2023-09-13: qty 1

## 2023-09-13 MED ORDER — ASPIRIN 81 MG PO CHEW
81.0000 mg | CHEWABLE_TABLET | Freq: Every day | ORAL | Status: DC
  Administered 2023-09-14: 81 mg via ORAL
  Filled 2023-09-13: qty 1

## 2023-09-13 MED ORDER — MIDAZOLAM HCL 2 MG/2ML IJ SOLN
INTRAMUSCULAR | Status: DC | PRN
  Administered 2023-09-13: 1 mg via INTRAVENOUS

## 2023-09-13 MED ORDER — NITROGLYCERIN IN D5W 200-5 MCG/ML-% IV SOLN
INTRAVENOUS | Status: AC
  Filled 2023-09-13: qty 250

## 2023-09-13 MED ORDER — ASPIRIN 81 MG PO TBEC
81.0000 mg | DELAYED_RELEASE_TABLET | Freq: Every day | ORAL | Status: DC
  Administered 2023-09-13: 81 mg via ORAL
  Filled 2023-09-13 (×2): qty 1

## 2023-09-13 MED ORDER — EZETIMIBE 10 MG PO TABS
10.0000 mg | ORAL_TABLET | Freq: Every day | ORAL | Status: DC
  Administered 2023-09-13 – 2023-09-14 (×2): 10 mg via ORAL
  Filled 2023-09-13 (×2): qty 1

## 2023-09-13 MED ORDER — PRASUGREL HCL 10 MG PO TABS
ORAL_TABLET | ORAL | Status: DC | PRN
  Administered 2023-09-13: 60 mg via ORAL

## 2023-09-13 MED ORDER — CLOPIDOGREL BISULFATE 75 MG PO TABS: 75.0000 mg | ORAL_TABLET | Freq: Every day | ORAL | Status: DC

## 2023-09-13 MED ORDER — FLUOXETINE HCL 20 MG PO CAPS
40.0000 mg | ORAL_CAPSULE | Freq: Every day | ORAL | Status: DC
Start: 2023-09-13 — End: 2023-09-14
  Administered 2023-09-13 – 2023-09-14 (×2): 40 mg via ORAL
  Filled 2023-09-13 (×2): qty 2

## 2023-09-13 MED ORDER — HEPARIN BOLUS VIA INFUSION
4000.0000 [IU] | Freq: Once | INTRAVENOUS | Status: AC
  Administered 2023-09-13: 4000 [IU] via INTRAVENOUS
  Filled 2023-09-13: qty 4000

## 2023-09-13 MED ORDER — LIDOCAINE HCL 1 % IJ SOLN
INTRAMUSCULAR | Status: AC
  Filled 2023-09-13: qty 20

## 2023-09-13 MED ORDER — NITROGLYCERIN 0.4 MG SL SUBL
0.4000 mg | SUBLINGUAL_TABLET | SUBLINGUAL | Status: DC | PRN
  Administered 2023-09-13: 0.4 mg via SUBLINGUAL
  Filled 2023-09-13: qty 1

## 2023-09-13 MED ORDER — NITROGLYCERIN IN D5W 200-5 MCG/ML-% IV SOLN
0.0000 ug/min | INTRAVENOUS | Status: DC
Start: 2023-09-13 — End: 2023-09-14
  Administered 2023-09-13: 5 ug/min via INTRAVENOUS
  Filled 2023-09-13: qty 250

## 2023-09-13 MED ORDER — ISOSORBIDE MONONITRATE ER 60 MG PO TB24: 120.0000 mg | ORAL_TABLET | Freq: Every day | ORAL | Status: DC

## 2023-09-13 MED ORDER — HEPARIN SODIUM (PORCINE) 1000 UNIT/ML IJ SOLN
INTRAMUSCULAR | Status: AC
  Filled 2023-09-13: qty 10

## 2023-09-13 MED ORDER — ACETAMINOPHEN 325 MG PO TABS
650.0000 mg | ORAL_TABLET | ORAL | Status: DC | PRN
  Administered 2023-09-13: 650 mg via ORAL
  Filled 2023-09-13: qty 2

## 2023-09-13 MED ORDER — ENSURE ENLIVE PO LIQD
237.0000 mL | Freq: Two times a day (BID) | ORAL | Status: DC
  Administered 2023-09-13 (×2): 237 mL via ORAL

## 2023-09-13 MED ORDER — ENOXAPARIN SODIUM 40 MG/0.4ML IJ SOSY
40.0000 mg | PREFILLED_SYRINGE | INTRAMUSCULAR | Status: DC
  Administered 2023-09-13: 40 mg via SUBCUTANEOUS
  Filled 2023-09-13: qty 0.4

## 2023-09-13 MED ORDER — FENTANYL CITRATE PF 50 MCG/ML IJ SOSY
PREFILLED_SYRINGE | INTRAMUSCULAR | Status: AC
  Filled 2023-09-13: qty 1

## 2023-09-13 MED ORDER — SODIUM CHLORIDE 0.9 % WEIGHT BASED INFUSION
1.0000 mL/kg/h | INTRAVENOUS | Status: AC
  Administered 2023-09-13 (×2): 1 mL/kg/h via INTRAVENOUS

## 2023-09-13 MED ORDER — CARVEDILOL 12.5 MG PO TABS
25.0000 mg | ORAL_TABLET | Freq: Two times a day (BID) | ORAL | Status: DC
  Filled 2023-09-13 (×2): qty 2

## 2023-09-13 MED ORDER — HEPARIN (PORCINE) 25000 UT/250ML-% IV SOLN
950.0000 [IU]/h | INTRAVENOUS | Status: DC
  Administered 2023-09-13: 950 [IU]/h via INTRAVENOUS
  Filled 2023-09-13: qty 250

## 2023-09-13 MED ORDER — VERAPAMIL HCL 2.5 MG/ML IV SOLN
INTRAVENOUS | Status: AC
Start: 2023-09-13 — End: ?
  Filled 2023-09-13: qty 2

## 2023-09-13 MED ORDER — CHLORHEXIDINE GLUCONATE CLOTH 2 % EX PADS
6.0000 | MEDICATED_PAD | Freq: Every day | CUTANEOUS | Status: DC
  Administered 2023-09-13: 6 via TOPICAL

## 2023-09-13 MED ORDER — ONDANSETRON HCL 4 MG/2ML IJ SOLN
4.0000 mg | Freq: Four times a day (QID) | INTRAMUSCULAR | Status: DC | PRN
  Administered 2023-09-13: 4 mg via INTRAVENOUS
  Filled 2023-09-13: qty 2

## 2023-09-13 MED ORDER — BENAZEPRIL HCL 20 MG PO TABS
40.0000 mg | ORAL_TABLET | Freq: Every day | ORAL | Status: DC
  Administered 2023-09-13 – 2023-09-14 (×2): 40 mg via ORAL
  Filled 2023-09-13 (×2): qty 2

## 2023-09-13 MED ORDER — PRASUGREL HCL 10 MG PO TABS: 10.0000 mg | ORAL_TABLET | Freq: Every day | ORAL | Status: DC

## 2023-09-13 MED ORDER — CLOPIDOGREL BISULFATE 75 MG PO TABS
300.0000 mg | ORAL_TABLET | Freq: Once | ORAL | Status: AC
  Administered 2023-09-14: 300 mg via ORAL
  Filled 2023-09-13: qty 4

## 2023-09-13 MED ORDER — ATORVASTATIN CALCIUM 80 MG PO TABS
80.0000 mg | ORAL_TABLET | Freq: Every day | ORAL | Status: DC
  Administered 2023-09-13 – 2023-09-14 (×2): 80 mg via ORAL
  Filled 2023-09-13 (×2): qty 1

## 2023-09-13 MED ORDER — PRASUGREL HCL 10 MG PO TABS
ORAL_TABLET | ORAL | Status: AC
Start: 2023-09-13 — End: ?
  Filled 2023-09-13: qty 6

## 2023-09-13 MED ORDER — IOHEXOL 300 MG/ML  SOLN
INTRAMUSCULAR | Status: DC | PRN
  Administered 2023-09-13: 150 mL

## 2023-09-13 MED ORDER — BUPROPION HCL ER (XL) 150 MG PO TB24
150.0000 mg | ORAL_TABLET | Freq: Every day | ORAL | Status: DC
  Administered 2023-09-13 – 2023-09-14 (×2): 150 mg via ORAL
  Filled 2023-09-13 (×2): qty 1

## 2023-09-13 MED ORDER — ATROPINE SULFATE 1 MG/10ML IJ SOSY
PREFILLED_SYRINGE | INTRAMUSCULAR | Status: AC
  Filled 2023-09-13: qty 10

## 2023-09-13 MED ORDER — HYDROXYZINE HCL 25 MG PO TABS: 25.0000 mg | ORAL_TABLET | Freq: Three times a day (TID) | ORAL | Status: DC | PRN

## 2023-09-13 MED ORDER — FENOFIBRATE 160 MG PO TABS
160.0000 mg | ORAL_TABLET | Freq: Every day | ORAL | Status: DC
  Administered 2023-09-13 – 2023-09-14 (×2): 160 mg via ORAL
  Filled 2023-09-13 (×2): qty 1

## 2023-09-13 MED ORDER — ORAL CARE MOUTH RINSE: 15.0000 mL | OROMUCOSAL | Status: DC | PRN

## 2023-09-13 MED ORDER — HEPARIN (PORCINE) IN NACL 1000-0.9 UT/500ML-% IV SOLN
INTRAVENOUS | Status: AC
  Filled 2023-09-13: qty 1000

## 2023-09-13 SURGICAL SUPPLY — 15 items
BALLOON MINITREK RX 1.5X12 (BALLOONS) IMPLANT
BALLOON TREK RX 2.5X15 (BALLOONS) IMPLANT
CATH 5FR JL3.5 JR4 ANG PIG MP (CATHETERS) IMPLANT
CATH VISTA GUIDE 6FR JR4 ECOPK (CATHETERS) IMPLANT
DEVICE RAD TR BAND REGULAR (VASCULAR PRODUCTS) IMPLANT
DRAPE BRACHIAL (DRAPES) IMPLANT
GLIDESHEATH SLEND SS 6F .021 (SHEATH) IMPLANT
GUIDEWIRE INQWIRE 1.5J.035X260 (WIRE) IMPLANT
KIT ENCORE 26 ADVANTAGE (KITS) IMPLANT
KIT SYRINGE INJ CVI SPIKEX1 (MISCELLANEOUS) IMPLANT
PACK CARDIAC CATH (CUSTOM PROCEDURE TRAY) ×1 IMPLANT
SET ATX-X65L (MISCELLANEOUS) IMPLANT
STATION PROTECTION PRESSURIZED (MISCELLANEOUS) IMPLANT
TUBING CIL FLEX 10 FLL-RA (TUBING) IMPLANT
WIRE G HI TQ BMW 190 (WIRE) IMPLANT

## 2023-09-13 NOTE — CV Procedure (Signed)
 Brief STEMI cath note Code STEMI called admitted patient by hospitalist presented with known coronary disease recurrent anginal symptoms Serial EKGs obtained but EKG [place] 35 suggested ST elevation inferiorly suggestive of STEMI After EKGs were sent to me for review I initiated code STEMI at around 4 AM Patient was brought to the cardiac Cath Lab right radial approach Patient was on aspirin IV heparin and nitro IV   Left ventriculogram showed borderline low LV function 45 to 50% left ventricular enlargement  Left main large minor irregularities LAD large with diffuse disease 50% Circumflex was large with diffuse disease 50 to 75% RCA was very large with proximal to mid widely patent stents distal area of ectasia Distal PL 100% occluded very distally and a small segment TIMI 0 flow  Intervention PCI of distal PL branch 1.5 x12 mm trek to 8 atm  Obstruction was relieved 100% down to 0 TIMI-3 flow restored  No other areas required urgent intervention at this point patient was pain-free  Patient was treated with prasugrel load IV nitroglycerin  discontinued IV heparin discontinued  Right radial sheath removed TR band applied Patient transferred to ICU for recovery Recommend aggressive medical therapy GDMT  Patient tolerated procedure well No complication Full cath/PCI note to follow  Burney Carter MD Interventional cardiology

## 2023-09-13 NOTE — Plan of Care (Signed)
  Problem: Education: Goal: Understanding of cardiac disease, CV risk reduction, and recovery process will improve Outcome: Progressing   Problem: Cardiac: Goal: Ability to achieve and maintain adequate cardiovascular perfusion will improve Outcome: Progressing   Problem: Health Behavior/Discharge Planning: Goal: Ability to safely manage health-related needs after discharge will improve Outcome: Progressing   Problem: Education: Goal: Knowledge of General Education information will improve Description: Including pain rating scale, medication(s)/side effects and non-pharmacologic comfort measures Outcome: Progressing   Problem: Cardiovascular: Goal: Ability to achieve and maintain adequate cardiovascular perfusion will improve Outcome: Progressing   Problem: Health Behavior/Discharge Planning: Goal: Ability to safely manage health-related needs after discharge will improve Outcome: Progressing

## 2023-09-13 NOTE — Assessment & Plan Note (Signed)
 Nicotine  patch

## 2023-09-13 NOTE — Progress Notes (Signed)
     SIGNIFICANT EVENT CROSS COVER NOTE                                   CODE STEMI ACTIVATION  NAME: Calvin Ortiz MRN: [id number] DOB : [birth year]    Concern as stated by nurse / staff   Hey - pt being admitted for NSTEMI and is on heparin drip. Reports severe increase in CP (rated 10/10) captured EKG and will try nitro now   pain has decreased to 4/10 after first nitro    Pertinent findings on chart review: Patient admitted a few hours prior with NSTEMI, currently on heparin infusion  Patient Assessment    09/13/2023    3:45 AM 09/13/2023    3:00 AM 09/13/2023    1:00 AM  Vitals with BMI  Systolic 138 134 409  Diastolic 94 78 94  Pulse 63 50 64   Physical Exam Vitals and nursing note reviewed.  Constitutional:      General: He is not in acute distress.    Comments: Patient appears anxious but acute pain mostly relieved  HENT:     Head: Normocephalic and atraumatic.  Cardiovascular:     Rate and Rhythm: Normal rate and regular rhythm.     Heart sounds: Normal heart sounds.  Pulmonary:     Effort: Pulmonary effort is normal.     Breath sounds: Normal breath sounds.  Abdominal:     Palpations: Abdomen is soft.     Tenderness: There is no abdominal tenderness.  Neurological:     Mental Status: Mental status is at baseline.        Assessment and  Interventions   Assessment:  STEMI, admitted earlier with NSTEMI Discussed with Dr. Beau Bound, on-call for STEMI activated code STEMI  Plan: Patient will be taken emergently to Cath Lab Nitroglycerin  infusion to titrate to pain was previously initiated after he complained of recurrent severe chest pain Continue heparin infusion Previously took aspirin at home Will go to stepdown post-cath      CRITICAL CARE Performed by: Lanetta Pion   Total critical care time: 40 minutes  Critical care time was exclusive of separately billable procedures and treating other patients.  Critical care was necessary to  treat or prevent imminent or life-threatening deterioration.  Critical care was time spent personally by me on the following activities: development of treatment plan with patient and/or surrogate as well as nursing, discussions with consultants, evaluation of patient's response to treatment, examination of patient, obtaining history from patient or surrogate, ordering and performing treatments and interventions, ordering and review of laboratory studies, ordering and review of radiographic studies, pulse oximetry and re-evaluation of patient's condition.

## 2023-09-13 NOTE — Progress Notes (Signed)
 Patient post cath, 7am.    - Pt oozing from  R radial site. Inflated TR  band with 3 cc. 8:15am.- right after removing 3 cc, site started oozing again. Pt now with hematoma. Pt also complaining of 4/10 CP pt states "feels like burning, but nothing like it was when I came in". Dr. Beau Bound notified, per MD remove TR band and apply pressure for 15 mins. Will continue to monitor closely.

## 2023-09-13 NOTE — ED Notes (Signed)
 Troponin value notified to [facility] MD

## 2023-09-13 NOTE — Assessment & Plan Note (Signed)
Continue antiplatelets and statins 

## 2023-09-13 NOTE — ED Notes (Addendum)
 Clothing removed from pt and placed in belongings bag. Pt states full resolution of CP at this time. Shawn EMT-P placed monitoring pads on pt and hooked pt to code cart. Nonskid socks and fall bracelet in place.

## 2023-09-13 NOTE — ED Notes (Signed)
 Cardiologist at bedside at this time.

## 2023-09-13 NOTE — Consult Note (Signed)
 CARDIOLOGY CONSULT NOTE               Patient ID: Calvin Ortiz MRN: [id number] DOB/AGE: 01-08-1971 53 y.o.  Admit date: 09/12/2023 Referring Physician Dr. Brion Cancel hospitalist Primary Physician Andrena Bang NP Primary Cardiologist  Reason for Consultation STEMI inferior  HPI: 53 year old male history of known coronary disease previous myocardial infarction 2017 PCI and stent to RCA hypertension obstructive sleep apnea smoking prior stroke complaining of chest pain symptoms 10 out of 10 crushing substernal improved with nitroglycerin  at home and aspirin but got worse he came to the emergency room serial EKGs were done but it 335 EKG was diagnostic for STEMI.  Hospitalist contacted me finally get the EKGs for me to see and Aisha initiated code STEMI at that point.  Unfortunately I was not able to identify diagnostic code STEMI EKG until about 3:55 AM.  STEMI paged was at 4 AM.  Patient had a CTA of the aorta done to rule out PE which was negative.  Review of systems complete and found to be negative unless listed above     Past Medical History:  Diagnosis Date   Anxiety    Depression    Diabetes mellitus without complication (HCC)    GERD (gastroesophageal reflux disease)    Hyperlipidemia    Hypertension    Migraines    Myocardial infarction [facility])    3 Stents   Sleep apnea    Stroke [facility])     Past Surgical History:  Procedure Laterality Date   BACK SURGERY  2005 and 2007   COLONOSCOPY WITH PROPOFOL  N/A 03/24/2021   Procedure: COLONOSCOPY WITH PROPOFOL ;  Surgeon: Luke Salaam, MD;  Location: [facility] ENDOSCOPY;  Service: Gastroenterology;  Laterality: N/A;   CORONARY ANGIOPLASTY WITH STENT PLACEMENT     CORONARY ARTERY BYPASS GRAFT  2013    Medications Prior to Admission  Medication Sig Dispense Refill Last Dose/Taking   acetaminophen  (TYLENOL ) 500 MG tablet Take 1,000 mg by mouth every 6 (six) hours as needed.      amLODipine  (NORVASC ) 10 MG tablet TAKE 1  TABLET(10 MG) BY MOUTH DAILY 90 tablet 1    aspirin EC 81 MG tablet Take 81 mg by mouth daily.      atorvastatin  (LIPITOR) 40 MG tablet TAKE 1 TABLET(40 MG) BY MOUTH DAILY 90 tablet 1    benazepril  (LOTENSIN ) 40 MG tablet TAKE 1 TABLET(40 MG) BY MOUTH DAILY 90 tablet 1    buPROPion  (WELLBUTRIN  XL) 150 MG 24 hr tablet Take 1 tablet (150 mg total) by mouth daily. 90 tablet 1    carvedilol  (COREG ) 25 MG tablet TAKE 1 TABLET(25 MG) BY MOUTH TWICE DAILY WITH A MEAL 180 tablet 1    fenofibrate  (TRICOR ) 145 MG tablet TAKE 1 TABLET(145 MG) BY MOUTH DAILY 90 tablet 1    fexofenadine  (ALLEGRA ) 180 MG tablet Take 1 tablet (180 mg total) by mouth daily. 90 tablet 3    FLUoxetine  (PROZAC ) 40 MG capsule TAKE 1 CAPSULE(40 MG) BY MOUTH DAILY 90 capsule 1    fluticasone  (FLONASE ) 50 MCG/ACT nasal spray 2 sprays in each nostril daily 16 g 3    hydrOXYzine  (ATARAX ) 25 MG tablet TAKE 2 TABLETS BY MOUTH EVERY NIGHT AT BEDTIME. MAY ALSO TAKE 1/2- 1 TABLET 2 TIMES DAILY AS NEEDED FOR ANXIETY 270 tablet 1    isosorbide  mononitrate (IMDUR ) 120 MG 24 hr tablet TAKE 1 TABLET(120 MG) BY MOUTH DAILY 90 tablet 1    nitroGLYCERIN  (NITROSTAT ) 0.4  MG SL tablet PLACE 1 TABLET UNDER THE TONGUE EVERY 5 MINUTES AS NEEDED FOR CHEST PAIN. MAY TAKE UP TO 3 DOSES 25 tablet 1    pantoprazole  (PROTONIX ) 20 MG tablet TAKE 1 TABLET(20 MG) BY MOUTH DAILY 90 tablet 1    Social History   Socioeconomic History   Marital status: Single    Spouse name: Not on file   Number of children: Not on file   Years of education: Not on file   Highest education level: Not on file  Occupational History   Not on file  Tobacco Use   Smoking status: Every Day    Current packs/day: 0.00    Types: Cigarettes    Last attempt to quit: 04/13/1993    Years since quitting: 30.4   Smokeless tobacco: Never   Tobacco comments:    Has quit off and on about 5-6 times (09/2017)  Vaping Use   Vaping status: Never Used  Substance and Sexual Activity   Alcohol  use: No   Drug use: No   Sexual activity: Not Currently  Other Topics Concern   Not on file  Social History Narrative   Not on file   Social Drivers of Health   Financial Resource Strain: Not on file  Food Insecurity: No Food Insecurity (09/13/2023)   Hunger Vital Sign    Worried About Running Out of Food in the Last Year: Never true    Ran Out of Food in the Last Year: Never true  Transportation Needs: No Transportation Needs (09/13/2023)   PRAPARE - Administrator, Civil Service (Medical): No    Lack of Transportation (Non-Medical): No  Physical Activity: Not on file  Stress: Not on file  Social Connections: Not on file  Intimate Partner Violence: Not At Risk (09/13/2023)   Humiliation, Afraid, Rape, and Kick questionnaire    Fear of Current or Ex-Partner: No    Emotionally Abused: No    Physically Abused: No    Sexually Abused: No    Family History  Problem Relation Age of Onset   COPD Mother    Hypertension Mother    Hyperlipidemia Mother    Depression Mother    Heart disease Mother    COPD Father    Diabetes Father    Heart disease Father    Stroke Father    Cancer Father        pelvic mass   Lung cancer Father    Depression Father    Diabetes Paternal Grandmother       Review of systems complete and found to be negative unless listed above      PHYSICAL EXAM  General: Well developed, well nourished, in no acute distress HEENT:  Normocephalic and atramatic Neck:  No JVD.  Lungs: Clear bilaterally to auscultation and percussion. Heart: HRRR . Normal S1 and S2 without gallops or murmurs.  Abdomen: Bowel sounds are positive, abdomen soft and non-tender  Msk:  Back normal, normal gait. Normal strength and tone for age. Extremities: No clubbing, cyanosis or edema.   Neuro: Alert and oriented X 3. Psych:  Good affect, responds appropriately  Labs:   Lab Results  Component Value Date   WBC 12.0 (H) 09/12/2023   HGB 15.4 09/12/2023   HCT  44.4 09/12/2023   MCV 82.1 09/12/2023   PLT 251 09/12/2023    Recent Labs  Lab 09/12/23 2220  NA 133*  K 3.8  CL 100  CO2 23  BUN 13  CREATININE 1.25*  CALCIUM  8.0*  GLUCOSE 98   Lab Results  Component Value Date   CKTOTAL 68 12/15/2018   CKMB 0.8 02/03/2012   TROPONINI < 0.02 02/03/2012    Lab Results  Component Value Date   CHOL 131 08/20/2023   CHOL 143 02/19/2023   CHOL 122 10/15/2022   Lab Results  Component Value Date   HDL 39 (L) 08/20/2023   HDL 38 (L) 02/19/2023   HDL 32 (L) 10/15/2022   Lab Results  Component Value Date   LDLCALC 72 08/20/2023   LDLCALC 82 02/19/2023   LDLCALC 66 10/15/2022   Lab Results  Component Value Date   TRIG 122 08/20/2023   TRIG 134 02/19/2023   TRIG 159 (H) 10/15/2022   Lab Results  Component Value Date   CHOLHDL 3.4 08/20/2023   CHOLHDL 3.8 02/19/2023   CHOLHDL 3.8 10/15/2022   No results found for: "LDLDIRECT"    Radiology: CARDIAC CATHETERIZATION Result Date: 09/13/2023   Prox RCA to Mid RCA lesion is 10% stenosed.   Dist RCA-1 lesion is 50% stenosed.   RPAV lesion is 100% stenosed.   Dist RCA-2 lesion is 50% stenosed with 50% stenosed side branch in RPDA.   Mid LAD to Dist LAD lesion is 75% stenosed.   Mid Cx to Dist Cx lesion is 50% stenosed.   There is mild left ventricular systolic dysfunction.   LV end diastolic pressure is normal.   The left ventricular ejection fraction is 45-50% by visual estimate.   In the absence of any other complications or medical issues, we expect the patient to be ready for discharge from an interventional cardiology perspective.   Recommend uninterrupted dual antiplatelet therapy with Aspirin 81mg  daily and Prasugrel 10mg  daily for a minimum of 12 months (ACS-Class I recommendation). Conclusion In-house STEMI admitted from  the emergency room EKG suggestive of acute inferior Treated with prasugrel heparin nitroglycerin  Right radial approach Left ventriculogram showed borderline normal left  ventricular function EF around 50% Coronaries Left main large minor irregularities LAD large diffuse 50-75 mid to distal LAD Circumflex large diffuse 50-75 mid to distal RCA very large widely patent proximal to mid stents distal PL branch occluded TIMI 0 flow PCI of distal PL with 1.5 x 12 mm trek balloon to 8 atm TIMI-3 flow restored from TIMI 0 No stents were placed to small area I am concerned that this may have represented an embolic phenomenon possibly from the ectatic distal portion of the RCA which was proximal to the PL branch Catheters and sheaths were removed and a TR band applied Patient is to be maintained on aspirin and prasugrel for at least 12 months Transferred to ICU for recovery Recommend aggressive medical therapy GDMT Advised patient refrain from tobacco abuse Patient tolerated procedure well No complications   CT Angio Chest Aorta W and/or Wo Contrast Result Date: 09/13/2023 CLINICAL DATA:  Acute aortic syndrome (AAS) suspected chest pain 10 out of 10, started today at 1900, feels like burn. EXAM: CT ANGIOGRAPHY CHEST WITH CONTRAST TECHNIQUE: Multidetector CT imaging of the chest was performed using the standard protocol during bolus administration of intravenous contrast. Multiplanar CT image reconstructions and MIPs were obtained to evaluate the vascular anatomy. RADIATION DOSE REDUCTION: This exam was performed according to the departmental dose-optimization program which includes automated exposure control, adjustment of the mA and/or kV according to patient size and/or use of iterative reconstruction technique. CONTRAST:  75mL OMNIPAQUE IOHEXOL 350 MG/ML SOLN COMPARISON:  CT chest 05/31/2023  FINDINGS: Cardiovascular: Satisfactory opacification of the pulmonary arteries to the segmental level. No evidence of pulmonary embolism. Normal heart size. No significant pericardial effusion. The thoracic aorta is normal in caliber. No atherosclerotic plaque of the thoracic aorta. At least 2 vessel  coronary artery calcifications. Mediastinum/Nodes: No enlarged mediastinal, hilar, or axillary lymph nodes. Thyroid  gland, trachea, and esophagus demonstrate no significant findings. Lungs/Pleura: Diffuse mild bronchial wall thickening. No focal consolidation. No pulmonary nodule. No pulmonary mass. No pleural effusion. No pneumothorax. Upper Abdomen: No acute abnormality.  Splenule noted. Musculoskeletal: No chest wall abnormality. No suspicious lytic or blastic osseous lesions. No acute displaced fracture. Review of the MIP images confirms the above findings. IMPRESSION: 1. No pulmonary embolus. 2. No acute intrathoracic abnormality. 3. At least 2 vessel coronary artery calcification. Electronically Signed   By: Morgane  Naveau M.D.   On: 09/13/2023 00:23   DG Chest Port 1 View Result Date: 09/12/2023 CLINICAL DATA:  Sepsis, chest pain EXAM: PORTABLE CHEST 1 VIEW COMPARISON:  05/07/2020 FINDINGS: Single frontal view of the chest demonstrates an unremarkable cardiac silhouette. No acute airspace disease, effusion, or pneumothorax. No acute bony abnormalities. IMPRESSION: 1. No acute intrathoracic process. Electronically Signed   By: Bobbye Burrow M.D.   On: 09/12/2023 23:06   DG Knee Complete 4 Views Left Result Date: 08/26/2023 CLINICAL DATA:  Prior chronic bilateral knee pain. EXAM: LEFT KNEE - COMPLETE 4+ VIEW COMPARISON:  None Available. FINDINGS: Minimal peripheral medial compartment and superior and lateral patellar degenerative spurring. The joint spaces are preserved. No joint effusion. No acute fracture or dislocation. IMPRESSION: Minimal medial and patellofemoral compartment osteoarthritis. Electronically Signed   By: Bertina Broccoli M.D.   On: 08/26/2023 17:17   DG Knee Complete 4 Views Right Result Date: 08/26/2023 CLINICAL DATA:  Chronic bilateral knee pain.  No known injury. EXAM: RIGHT KNEE - COMPLETE 4+ VIEW COMPARISON:  None Available. FINDINGS: Minimal peripheral medial compartment and  superior and lateral patellar degenerative spurring. No joint effusion. No significant joint space narrowing. No acute fracture or dislocation. IMPRESSION: Minimal medial and patellofemoral compartment osteoarthritis. Electronically Signed   By: Bertina Broccoli M.D.   On: 08/26/2023 17:17    EKG: Initial EKG ST elevation inferiorly Q waves anterior septum reciprocal changes laterally at around 3:35am  ASSESSMENT AND PLAN:  STEMI inferior Coronary artery disease Myocardial infarction PCI and stent 2017 Angina Hypertension Obstructive sleep apnea Tobacco abuse History of CVA . Plan Status post PCI for STEMI distal inferior PL branch no stent placed now on Plavix Continue aspirin with Plavix for at least 12 months High-dose statin therapy with Lipitor Obstructive sleep apnea recommend sleep study CPAP weight loss Hypertension reasonably controlled continue Coreg  benazepril  Imdur  History of CVA on statin therapy blood pressure control antiplatelets Wean IV Nitropaste At the patient follow-up with cardiac rehab Anticipate discharge 24 to 48 hours Intervention with PCI and stent to distal PL. significant ectasia distally potential embolization   Signed: Antonette Batters MD 09/13/2023, 1:49 PM

## 2023-09-13 NOTE — Assessment & Plan Note (Signed)
 CPAP as desired

## 2023-09-13 NOTE — Progress Notes (Signed)
 PHARMACY - ANTICOAGULATION CONSULT NOTE  Pharmacy Consult for Heparin  Indication: chest pain/ACS  Allergies  Allergen Reactions   Buprenorphine Hcl     Other reaction(s): Other (See Comments), Other (See Comments)   Morphine And Codeine    Sulfa Antibiotics Rash    Patient Measurements: Weight: 80.7 kg (178 lb)  Heparin DW = 78 kg   Vital Signs: Temp: 97.8 F (36.6 C) (05/25 2215) Temp Source: Oral (05/25 2215) BP: 131/94 (05/26 0100) Pulse Rate: 64 (05/26 0100)  Labs: Recent Labs    09/12/23 2220 09/13/23 0036  HGB 15.4  --   HCT 44.4  --   PLT 251  --   CREATININE 1.25*  --   TROPONINIHS 9 84*    Estimated Creatinine Clearance: 68.4 mL/min (A) (by C-G formula based on SCr of 1.25 mg/dL (H)).   Medical History: Past Medical History:  Diagnosis Date   Anxiety    Depression    Diabetes mellitus without complication (HCC)    GERD (gastroesophageal reflux disease)    Hyperlipidemia    Hypertension    Migraines    Myocardial infarction (HCC)    3 Stents   Sleep apnea    Stroke [facility]     Medications:  (Not in a hospital admission)   Assessment: Pharmacy consulted to dose heparin in this 53 year old male admitted with ACS/NSTEMI.  No prior anticoag noted. CrCl = 68.4 ml/min   Goal of Therapy:  Heparin level 0.3-0.7 units/ml Monitor platelets by anticoagulation protocol: Yes   Plan:  Give 4000 units bolus x 1 Start heparin infusion at 950 units/hr Check anti-Xa level in 6 hours and daily while on heparin Continue to monitor H&H and platelets  Maisee Vollman D 09/13/2023,2:01 AM

## 2023-09-13 NOTE — ED Notes (Signed)
 Pt reports severe increase in CP (rated 10/10) with SOB to the point where pt is tachypneic, yelling out in pain, and tripoding self over bed rail. Captured EKG and admissions/floor coverage Vallarie Gauze) notified - immediate response from [place] and orders received.

## 2023-09-13 NOTE — Care Plan (Signed)
 Patient admitted earlier this morning by my colleague.  I have seen and examined the patient, reviewed the h and p, and agree with the assessment and plan unless stipulated otherwise.  Hx CAD presenting with chest pain, diagnosed with nstemi, admitted and started on heparin infusion. Developed worsening chest pain and EKG showed STEMI so taken emergently to cath lab. EF 45-50, multi-vessel CAD with PCI angioplasty to distal PL branch. Continues to complain of chest pain, will notify cardiology team. Hemodynamically stable.

## 2023-09-13 NOTE — Assessment & Plan Note (Signed)
 Continue carvedilol , amlodipine , benazepril 

## 2023-09-13 NOTE — H&P (Signed)
 History and Physical    Patient: Calvin Ortiz AOZ:[id number] DOB: 24-Jan-1971 DOA: 09/12/2023 DOS: the patient was seen and examined on 09/13/2023 PCP: Carollynn Cirri, NP  Patient coming from: Home  Chief Complaint:  Chief Complaint  Patient presents with   Chest Pain    HPI: Calvin Ortiz is a 53 y.o. male with medical history significant for CAD with MI 2017 s/p stent, HTN, OSA, tobacco use disorder, prior stroke being admitted with NSTEMI after presenting with 10 /10 crushing retrosternal chest pain with only temporary response to home nitroglycerin  x 4 and aspirin 324 mg, with pain resuming while in the ED ED course and data review: BP was 171/115 on arrival with otherwise normal vitalsThen Labs notable for troponin 9--84 Creatinine 1.25, near baseline of 1.19  EKG, personally viewed and interpreted showing sinus at 64, not meeting STEMI criteria  Chest x-ray clear  CTA aorta was done in the ED due to recurrent pain.  Negative for PE or acute intrathoracic abnormality.  At least two-vessel coronary artery calcification  Patient treated with fentanyl for pain, started on heparin infusion  Hospitalist consulted for admission.     Review of Systems: As mentioned in the history of present illness. All other systems reviewed and are negative.  Past Medical History:  Diagnosis Date   Anxiety    Depression    Diabetes mellitus without complication (HCC)    GERD (gastroesophageal reflux disease)    Hyperlipidemia    Hypertension    Migraines    Myocardial infarction [facility])    3 Stents   Sleep apnea    Stroke [facility])    Past Surgical History:  Procedure Laterality Date   BACK SURGERY  2005 and 2007   COLONOSCOPY WITH PROPOFOL  N/A 03/24/2021   Procedure: COLONOSCOPY WITH PROPOFOL ;  Surgeon: Luke Salaam, MD;  Location: [facility] ENDOSCOPY;  Service: Gastroenterology;  Laterality: N/A;   CORONARY ANGIOPLASTY WITH STENT PLACEMENT     CORONARY ARTERY BYPASS GRAFT   2013   Social History:  reports that he has been smoking cigarettes. He has never used smokeless tobacco. He reports that he does not drink alcohol and does not use drugs.  Allergies  Allergen Reactions   Buprenorphine Hcl     Other reaction(s): Other (See Comments), Other (See Comments)   Morphine And Codeine    Sulfa Antibiotics Rash    Family History  Problem Relation Age of Onset   COPD Mother    Hypertension Mother    Hyperlipidemia Mother    Depression Mother    Heart disease Mother    COPD Father    Diabetes Father    Heart disease Father    Stroke Father    Cancer Father        pelvic mass   Lung cancer Father    Depression Father    Diabetes Paternal Grandmother     Prior to Admission medications   Medication Sig Start Date End Date Taking? Authorizing Provider  acetaminophen  (TYLENOL ) 500 MG tablet Take 1,000 mg by mouth every 6 (six) hours as needed.    [provider]  amLODipine  (NORVASC ) 10 MG tablet TAKE 1 TABLET(10 MG) BY MOUTH DAILY 08/20/23   Carollynn Cirri, NP  aspirin EC 81 MG tablet Take 81 mg by mouth daily.    [provider]  atorvastatin  (LIPITOR) 40 MG tablet TAKE 1 TABLET(40 MG) BY MOUTH DAILY 08/20/23   Carollynn Cirri, NP  benazepril  (LOTENSIN ) 40 MG tablet  TAKE 1 TABLET(40 MG) BY MOUTH DAILY 08/20/23   Carollynn Cirri, NP  buPROPion  (WELLBUTRIN  XL) 150 MG 24 hr tablet Take 1 tablet (150 mg total) by mouth daily. 08/20/23   Carollynn Cirri, NP  carvedilol  (COREG ) 25 MG tablet TAKE 1 TABLET(25 MG) BY MOUTH TWICE DAILY WITH A MEAL 08/20/23   Carollynn Cirri, NP  fenofibrate  (TRICOR ) 145 MG tablet TAKE 1 TABLET(145 MG) BY MOUTH DAILY 08/20/23   Carollynn Cirri, NP  fexofenadine  (ALLEGRA ) 180 MG tablet Take 1 tablet (180 mg total) by mouth daily. 08/03/19   Annalee Barren, FNP  FLUoxetine  (PROZAC ) 40 MG capsule TAKE 1 CAPSULE(40 MG) BY MOUTH DAILY 08/20/23   Carollynn Cirri, NP  fluticasone  (FLONASE ) 50 MCG/ACT nasal spray 2 sprays in each  nostril daily 07/31/20   Karamalegos, Kayleen Party, DO  hydrOXYzine  (ATARAX ) 25 MG tablet TAKE 2 TABLETS BY MOUTH EVERY NIGHT AT BEDTIME. MAY ALSO TAKE 1/2- 1 TABLET 2 TIMES DAILY AS NEEDED FOR ANXIETY 08/20/23   Carollynn Cirri, NP  isosorbide  mononitrate (IMDUR ) 120 MG 24 hr tablet TAKE 1 TABLET(120 MG) BY MOUTH DAILY 08/20/23   Carollynn Cirri, NP  nitroGLYCERIN  (NITROSTAT ) 0.4 MG SL tablet PLACE 1 TABLET UNDER THE TONGUE EVERY 5 MINUTES AS NEEDED FOR CHEST PAIN. MAY TAKE UP TO 3 DOSES 08/08/21   Carollynn Cirri, NP  pantoprazole  (PROTONIX ) 20 MG tablet TAKE 1 TABLET(20 MG) BY MOUTH DAILY 08/20/23   Carollynn Cirri, NP    Physical Exam: Vitals:   09/13/23 0000 09/13/23 0030 09/13/23 0100 09/13/23 0204  BP: 124/76 130/78 (!) 131/94   Pulse: 63 (!) 55 64   Resp: (!) 23 (!) 21 20   Temp:    97.7 F (36.5 C)  TempSrc:    Oral  SpO2: 98% 96% 100%   Weight:       Physical Exam Vitals and nursing note reviewed.  Constitutional:      General: He is not in acute distress. HENT:     Head: Normocephalic and atraumatic.  Cardiovascular:     Rate and Rhythm: Normal rate and regular rhythm.     Heart sounds: Normal heart sounds.  Pulmonary:     Effort: Pulmonary effort is normal.     Breath sounds: Normal breath sounds.  Abdominal:     Palpations: Abdomen is soft.     Tenderness: There is no abdominal tenderness.  Neurological:     Mental Status: Mental status is at baseline.     Labs on Admission: I have personally reviewed following labs and imaging studies  CBC: Recent Labs  Lab 09/12/23 2220  WBC 12.0*  HGB 15.4  HCT 44.4  MCV 82.1  PLT 251   Basic Metabolic Panel: Recent Labs  Lab 09/12/23 2220  NA 133*  K 3.8  CL 100  CO2 23  GLUCOSE 98  BUN 13  CREATININE 1.25*  CALCIUM  8.0*   GFR: Estimated Creatinine Clearance: 68.4 mL/min (A) (by C-G formula based on SCr of 1.25 mg/dL (H)). Liver Function Tests: No results for input(s): "AST", "ALT", "ALKPHOS", "BILITOT",  "PROT", "ALBUMIN" in the last 168 hours. No results for input(s): "LIPASE", "AMYLASE" in the last 168 hours. No results for input(s): "AMMONIA" in the last 168 hours. Coagulation Profile: No results for input(s): "INR", "PROTIME" in the last 168 hours. Cardiac Enzymes: No results for input(s): "CKTOTAL", "CKMB", "CKMBINDEX", "TROPONINI" in the last 168 hours. BNP (last 3 results) No results for input(s): "  PROBNP" in the last 8760 hours. HbA1C: No results for input(s): "HGBA1C" in the last 72 hours. CBG: No results for input(s): "GLUCAP" in the last 168 hours. Lipid Profile: No results for input(s): "CHOL", "HDL", "LDLCALC", "TRIG", "CHOLHDL", "LDLDIRECT" in the last 72 hours. Thyroid  Function Tests: No results for input(s): "TSH", "T4TOTAL", "FREET4", "T3FREE", "THYROIDAB" in the last 72 hours. Anemia Panel: No results for input(s): "VITAMINB12", "FOLATE", "FERRITIN", "TIBC", "IRON", "RETICCTPCT" in the last 72 hours. Urine analysis:    Component Value Date/Time   APPEARANCEUR Clear 11/26/2015 1855   GLUCOSEU Negative 11/26/2015 1855   BILIRUBINUR negative 08/03/2019 1352   BILIRUBINUR Negative 11/26/2015 1855   PROTEINUR Negative 08/03/2019 1352   PROTEINUR Negative 11/26/2015 1855   UROBILINOGEN 0.2 08/03/2019 1352   NITRITE negative 08/03/2019 1352   NITRITE Negative 11/26/2015 1855   LEUKOCYTESUR Negative 08/03/2019 1352   LEUKOCYTESUR Negative 11/26/2015 1855    Radiological Exams on Admission: CT Angio Chest Aorta W and/or Wo Contrast Result Date: 09/13/2023 CLINICAL DATA:  Acute aortic syndrome (AAS) suspected chest pain 10 out of 10, started today at 1900, feels like burn. EXAM: CT ANGIOGRAPHY CHEST WITH CONTRAST TECHNIQUE: Multidetector CT imaging of the chest was performed using the standard protocol during bolus administration of intravenous contrast. Multiplanar CT image reconstructions and MIPs were obtained to evaluate the vascular anatomy. RADIATION DOSE REDUCTION:  This exam was performed according to the departmental dose-optimization program which includes automated exposure control, adjustment of the mA and/or kV according to patient size and/or use of iterative reconstruction technique. CONTRAST:  75mL OMNIPAQUE IOHEXOL 350 MG/ML SOLN COMPARISON:  CT chest 05/31/2023 FINDINGS: Cardiovascular: Satisfactory opacification of the pulmonary arteries to the segmental level. No evidence of pulmonary embolism. Normal heart size. No significant pericardial effusion. The thoracic aorta is normal in caliber. No atherosclerotic plaque of the thoracic aorta. At least 2 vessel coronary artery calcifications. Mediastinum/Nodes: No enlarged mediastinal, hilar, or axillary lymph nodes. Thyroid  gland, trachea, and esophagus demonstrate no significant findings. Lungs/Pleura: Diffuse mild bronchial wall thickening. No focal consolidation. No pulmonary nodule. No pulmonary mass. No pleural effusion. No pneumothorax. Upper Abdomen: No acute abnormality.  Splenule noted. Musculoskeletal: No chest wall abnormality. No suspicious lytic or blastic osseous lesions. No acute displaced fracture. Review of the MIP images confirms the above findings. IMPRESSION: 1. No pulmonary embolus. 2. No acute intrathoracic abnormality. 3. At least 2 vessel coronary artery calcification. Electronically Signed   By: Morgane  Naveau M.D.   On: 09/13/2023 00:23   DG Chest Port 1 View Result Date: 09/12/2023 CLINICAL DATA:  Sepsis, chest pain EXAM: PORTABLE CHEST 1 VIEW COMPARISON:  05/07/2020 FINDINGS: Single frontal view of the chest demonstrates an unremarkable cardiac silhouette. No acute airspace disease, effusion, or pneumothorax. No acute bony abnormalities. IMPRESSION: 1. No acute intrathoracic process. Electronically Signed   By: Bobbye Burrow M.D.   On: 09/12/2023 23:06   Data Reviewed for HPI: Relevant notes from primary care and specialist visits, past discharge summaries as available in EHR,  including Care Everywhere. Prior diagnostic testing as pertinent to current admission diagnoses Updated medications and problem lists for reconciliation ED course, including vitals, labs, imaging, treatment and response to treatment Triage notes, nursing and pharmacy notes and ED provider's notes Notable results as noted above in HPI      Assessment and Plan: * NSTEMI (non-ST elevated myocardial infarction) (HCC) CAD with history of MI and stent Continue heparin infusion Nitroglycerin  sublingual as needed chest pain with morphine for breakthrough Continue aspirin, atorvastatin ,  carvedilol  and benazepril , Imdur  Cardiology consult   History of CVA (cerebrovascular accident) Continue antiplatelets and statins  OSA (obstructive sleep apnea) CPAP as desired  Tobacco use disorder Nicotine patch  Hypertension Continue carvedilol , amlodipine , benazepril    DVT prophylaxis: Heparin  Consults: [facility] cardiology  Advance Care Planning: dull code  Family Communication: none  Disposition Plan: Back to previous home environment  Severity of Illness: The appropriate patient status for this patient is OBSERVATION. Observation status is judged to be reasonable and necessary in order to provide the required intensity of service to ensure the patient's safety. The patient's presenting symptoms, physical exam findings, and initial radiographic and laboratory data in the context of their medical condition is felt to place them at decreased risk for further clinical deterioration. Furthermore, it is anticipated that the patient will be medically stable for discharge from the hospital within 2 midnights of admission.   Author: Lanetta Pion, MD 09/13/2023 2:22 AM  For on call review [email or web address].

## 2023-09-13 NOTE — Assessment & Plan Note (Addendum)
 CAD with history of MI and stent Continue heparin infusion Nitroglycerin  sublingual as needed chest pain with morphine for breakthrough Continue aspirin, atorvastatin , carvedilol  and benazepril , Imdur  Cardiology consult

## 2023-09-14 ENCOUNTER — Inpatient Hospital Stay: Admit: 2023-09-14 | Discharge: 2023-09-14 | Disposition: A | Discharge status: Home / Self Care

## 2023-09-14 ENCOUNTER — Other Ambulatory Visit: Payer: Self-pay

## 2023-09-14 ENCOUNTER — Encounter: Payer: Self-pay | Admitting: Internal Medicine

## 2023-09-14 DIAGNOSIS — I213 ST elevation (STEMI) myocardial infarction of unspecified site: Secondary | ICD-10-CM | POA: Diagnosis not present

## 2023-09-14 DIAGNOSIS — I10 Essential (primary) hypertension: Secondary | ICD-10-CM | POA: Diagnosis not present

## 2023-09-14 DIAGNOSIS — I214 Non-ST elevation (NSTEMI) myocardial infarction: Secondary | ICD-10-CM | POA: Diagnosis not present

## 2023-09-14 DIAGNOSIS — Z9861 Coronary angioplasty status: Secondary | ICD-10-CM | POA: Diagnosis not present

## 2023-09-14 LAB — CBC
HCT: 43.8 % (ref 39.0–52.0)
Hemoglobin: 15.1 g/dL (ref 13.0–17.0)
MCH: 28.1 pg (ref 26.0–34.0)
MCHC: 34.5 g/dL (ref 30.0–36.0)
MCV: 81.6 fL (ref 80.0–100.0)
Platelets: 224 10*3/uL (ref 150–400)
RBC: 5.37 MIL/uL (ref 4.22–5.81)
RDW: 12.6 % (ref 11.5–15.5)
WBC: 8.8 10*3/uL (ref 4.0–10.5)
nRBC: 0 % (ref 0.0–0.2)

## 2023-09-14 LAB — ECHOCARDIOGRAM COMPLETE
AR max vel: 3.71 cm2
AV Area VTI: 4.07 cm2
AV Area mean vel: 3.84 cm2
AV Mean grad: 3 mmHg
AV Peak grad: 5.8 mmHg
Ao pk vel: 1.2 m/s
Area-P 1/2: 3.08 cm2
Height: 64 in
MV VTI: 2.89 cm2
S' Lateral: 2.9 cm
Weight: 3030 [oz_av]

## 2023-09-14 LAB — BASIC METABOLIC PANEL WITH GFR
Anion gap: 8 (ref 5–15)
BUN: 15 mg/dL (ref 6–20)
CO2: 22 mmol/L (ref 22–32)
Calcium: 8.8 mg/dL — ABNORMAL LOW (ref 8.9–10.3)
Chloride: 109 mmol/L (ref 98–111)
Creatinine, Ser: 0.98 mg/dL (ref 0.61–1.24)
GFR, Estimated: >60 mL/min (ref >60)
Glucose, Bld: 89 mg/dL (ref 70–99)
Potassium: 3.6 mmol/L (ref 3.5–5.1)
Sodium: 139 mmol/L (ref 135–145)

## 2023-09-14 LAB — GLUCOSE, CAPILLARY: Glucose-Capillary: 104 mg/dL — ABNORMAL HIGH (ref 70–99)

## 2023-09-14 MED ORDER — ATORVASTATIN CALCIUM 80 MG PO TABS
80.0000 mg | ORAL_TABLET | Freq: Every day | ORAL | 1 refills | Status: DC
  Filled 2023-09-14: qty 90, 90d supply, fill #0
  Filled 2024-01-19: qty 90, 90d supply, fill #1

## 2023-09-14 MED ORDER — CLOPIDOGREL BISULFATE 75 MG PO TABS
75.0000 mg | ORAL_TABLET | Freq: Every day | ORAL | 1 refills | Status: DC
  Filled 2023-09-14: qty 90, 90d supply, fill #0

## 2023-09-14 NOTE — Progress Notes (Signed)
*  PRELIMINARY RESULTS* Echocardiogram 2D Echocardiogram has been performed.  Calvin Ortiz 09/14/2023, 1:48 PM

## 2023-09-14 NOTE — Progress Notes (Signed)
 [facility] CLINIC CARDIOLOGY PROGRESS NOTE   Patient ID: Calvin Ortiz MRN: [id number] DOB/AGE: [birth year] 53 y.o.  Admit date: 09/12/2023 Referring Physician Dr. Brion Cancel Primary Physician Thalia Filler, Rankin Buzzard, NP Primary Cardiologist [facility] (last saw 2019) Reason for Consultation STEMI inferior  HPI: Calvin Ortiz is a 53 y.o. male with a past medical history of CAD s/p with PCI stent to RCA (2017), hypertension, OSA, tobacco use disorder, history of stroke who presented to the ED on 09/12/2023 for severe chest pain that improved with s/p nitroglycerin  and aspirin at home.  Recurrence of chest pain occurred prior to arrival to ED and serial EKGs revealed diagnostic for STEMI.  Patient underwent emergent LHC on 05/26 and received PCI of distal PL branch.   Interval History: -Patient seen and examined this AM and laying comfortably in hospital bed. Patient states he feels great  and denies chest pain, SOB or palpitations.  -Patients BP elevated and HR  stable this AM. Overnight Tele showed no significant events.  -Patient remains on room air with stable SpO2.  - Patient underwent LHC on 05/26 would and received PCI of distal PL branch.  -Right radial Incision site is clean and dry with no evidence of significant swelling, bruising, or active bleeding . Hgb and Cr are stable this morning.   Review of systems complete and found to be negative unless listed above    Vitals:   09/14/23 0900 09/14/23 1000 09/14/23 1100 09/14/23 1200  BP: (!) 172/137 (!) 144/75 (!) 129/111 (!) 155/99  Pulse: 67 61 (!) 56 61  Resp: 15 (!) 22 18 20   Temp:      TempSrc:      SpO2:  97%  97%  Weight:      Height:         Intake/Output Summary (Last 24 hours) at 09/14/2023 1406 Last data filed at 09/14/2023 1000 Gross per 24 hour  Intake 593.74 ml  Output 400 ml  Net 193.74 ml     PHYSICAL EXAM General: Well-appearing male, well nourished, in no acute distress. HEENT:  Normocephalic and atraumatic. Neck: No JVD.  Lungs: Normal respiratory effort on room air. Clear bilaterally to auscultation. No wheezes, crackles, rhonchi.  Heart: HRRR. Normal S1 and S2 without gallops or murmurs. Radial & DP pulses 2+ bilaterally. Abdomen: Non-distended appearing.  Msk: Normal strength and tone for age. Extremities: No clubbing, cyanosis or edema.  Right radial Incision site is clean and dry with no evidence of significant swelling, bruising, or active bleeding . Neuro: Alert and oriented X 3. Psych: Mood appropriate, affect congruent.    LABS: Basic Metabolic Panel: Recent Labs    09/12/23 2220 09/14/23 0348  NA 133* 139  K 3.8 3.6  CL 100 109  CO2 23 22  GLUCOSE 98 89  BUN 13 15  CREATININE 1.25* 0.98  CALCIUM  8.0* 8.8*   Liver Function Tests: No results for input(s): "AST", "ALT", "ALKPHOS", "BILITOT", "PROT", "ALBUMIN" in the last 72 hours. No results for input(s): "LIPASE", "AMYLASE" in the last 72 hours. CBC: Recent Labs    09/12/23 2220 09/14/23 0348  WBC 12.0* 8.8  HGB 15.4 15.1  HCT 44.4 43.8  MCV 82.1 81.6  PLT 251 224   Cardiac Enzymes: Recent Labs    09/12/23 2220 09/13/23 0036  TROPONINIHS 9 84*   BNP: No results for input(s): "BNP" in the last 72 hours. D-Dimer: No results for input(s): "DDIMER" in the last 72 hours. Hemoglobin A1C: No results for  input(s): "HGBA1C" in the last 72 hours. Fasting Lipid Panel: No results for input(s): "CHOL", "HDL", "LDLCALC", "TRIG", "CHOLHDL", "LDLDIRECT" in the last 72 hours. Thyroid  Function Tests: Recent Labs    09/13/23 0738  TSH 0.953   Anemia Panel: No results for input(s): "VITAMINB12", "FOLATE", "FERRITIN", "TIBC", "IRON", "RETICCTPCT" in the last 72 hours.  ECHOCARDIOGRAM COMPLETE Result Date: 09/14/2023    ECHOCARDIOGRAM REPORT   Patient Name:   Calvin Ortiz Date of Exam: 09/14/2023 Medical Rec #:  [id number]             Height:       64.0 in Accession #:    [id number]             Weight:       189.4 lb Date of Birth:  07-18-1970              BSA:          1.912 m Patient Age:    53 years              BP:           155/99 mmHg Patient Gender: M                     HR:           61 bpm. Exam Location:  [facility] Procedure: 2D Echo, Color Doppler and Cardiac Doppler (Both Spectral and Color            Flow Doppler were utilized during procedure). Indications:     NSTEMI I21.4  History:         Patient has no prior history of Echocardiogram examinations.                  Previous Myocardial Infarction, Stroke; Risk Factors:Diabetes                  and Hypertension.  Sonographer:     Broadus Canes Referring Phys:  [id number] Creighton Doffing Diagnosing Phys: Percival Brace MD IMPRESSIONS  1. Left ventricular ejection fraction, by estimation, is 65 to 70%. The left ventricle has normal function. The left ventricle has no regional wall motion abnormalities. Left ventricular diastolic parameters are consistent with Grade I diastolic dysfunction (impaired relaxation).  2. Right ventricular systolic function is normal. The right ventricular size is normal.  3. The mitral valve is normal in structure. Trivial mitral valve regurgitation. No evidence of mitral stenosis.  4. The aortic valve is normal in structure. Aortic valve regurgitation is not visualized. No aortic stenosis is present.  5. The inferior vena cava is normal in size with greater than 50% respiratory variability, suggesting right atrial pressure of 3 mmHg. FINDINGS  Left Ventricle: Left ventricular ejection fraction, by estimation, is 65 to 70%. The left ventricle has normal function. The left ventricle has no regional wall motion abnormalities. Strain was performed and the global longitudinal strain is indeterminate. The left ventricular internal cavity size was normal in size. There is no left ventricular hypertrophy. Left ventricular diastolic parameters are consistent with Grade I diastolic dysfunction (impaired relaxation).  Right Ventricle: The right ventricular size is normal. No increase in right ventricular wall thickness. Right ventricular systolic function is normal. Left Atrium: Left atrial size was normal in size. Right Atrium: Right atrial size was normal in size. Pericardium: There is no evidence of pericardial effusion. Mitral Valve: The mitral valve is normal in structure. Trivial mitral valve regurgitation. No evidence of  mitral valve stenosis. MV peak gradient, 2.5 mmHg. The mean mitral valve gradient is 1.0 mmHg. Tricuspid Valve: The tricuspid valve is normal in structure. Tricuspid valve regurgitation is trivial. No evidence of tricuspid stenosis. Aortic Valve: The aortic valve is normal in structure. Aortic valve regurgitation is not visualized. No aortic stenosis is present. Aortic valve mean gradient measures 3.0 mmHg. Aortic valve peak gradient measures 5.8 mmHg. Aortic valve area, by VTI measures 4.07 cm. Pulmonic Valve: The pulmonic valve was normal in structure. Pulmonic valve regurgitation is not visualized. No evidence of pulmonic stenosis. Aorta: The aortic root is normal in size and structure. Venous: The inferior vena cava is normal in size with greater than 50% respiratory variability, suggesting right atrial pressure of 3 mmHg. IAS/Shunts: No atrial level shunt detected by color flow Doppler. Additional Comments: 3D was performed not requiring image post processing on an independent workstation and was indeterminate.  LEFT VENTRICLE PLAX 2D LVIDd:         4.70 cm   Diastology LVIDs:         2.90 cm   LV e' medial:    9.03 cm/s LV PW:         1.00 cm   LV E/e' medial:  7.2 LV IVS:        1.20 cm   LV e' lateral:   8.59 cm/s LVOT diam:     2.20 cm   LV E/e' lateral: 7.6 LV SV:         76 LV SV Index:   40 LVOT Area:     3.80 cm  RIGHT VENTRICLE RV Basal diam:  4.20 cm RV Mid diam:    3.50 cm LEFT ATRIUM             Index        RIGHT ATRIUM           Index LA diam:        3.40 cm 1.78 cm/m   RA Area:      17.80 cm LA Vol (A2C):   20.3 ml 10.62 ml/m  RA Volume:   50.30 ml  26.31 ml/m LA Vol (A4C):   16.1 ml 8.42 ml/m LA Biplane Vol: 18.1 ml 9.47 ml/m  AORTIC VALVE AV Area (Vmax):    3.71 cm AV Area (Vmean):   3.84 cm AV Area (VTI):     4.07 cm AV Vmax:           120.00 cm/s AV Vmean:          77.800 cm/s AV VTI:            0.187 m AV Peak Grad:      5.8 mmHg AV Mean Grad:      3.0 mmHg LVOT Vmax:         117.00 cm/s LVOT Vmean:        78.600 cm/s LVOT VTI:          0.200 m LVOT/AV VTI ratio: 1.07  AORTA Ao Root diam: 2.90 cm MITRAL VALVE MV Area (PHT): 3.08 cm    SHUNTS MV Area VTI:   2.89 cm    Systemic VTI:  0.20 m MV Peak grad:  2.5 mmHg    Systemic Diam: 2.20 cm MV Mean grad:  1.0 mmHg MV Vmax:       0.80 m/s MV Vmean:      48.1 cm/s MV Decel Time: 246 msec MV E velocity: 65.00 cm/s MV A velocity: 77.00 cm/s  MV E/A ratio:  0.84 Percival Brace MD Electronically signed by Percival Brace MD Signature Date/Time: 09/14/2023/1:56:44 PM    Final    CARDIAC CATHETERIZATION Result Date: 09/13/2023   Prox RCA to Mid RCA lesion is 10% stenosed.   Dist RCA-1 lesion is 50% stenosed.   RPAV lesion is 100% stenosed.   Dist RCA-2 lesion is 50% stenosed with 50% stenosed side branch in RPDA.   Mid LAD to Dist LAD lesion is 75% stenosed.   Mid Cx to Dist Cx lesion is 50% stenosed.   There is mild left ventricular systolic dysfunction.   LV end diastolic pressure is normal.   The left ventricular ejection fraction is 45-50% by visual estimate.   In the absence of any other complications or medical issues, we expect the patient to be ready for discharge from an interventional cardiology perspective.   Recommend uninterrupted dual antiplatelet therapy with Aspirin 81mg  daily and Prasugrel 10mg  daily for a minimum of 12 months (ACS-Class I recommendation). Conclusion In-house STEMI admitted from  the emergency room EKG suggestive of acute inferior Treated with prasugrel heparin nitroglycerin  Right radial approach  Left ventriculogram showed borderline normal left ventricular function EF around 50% Coronaries Left main large minor irregularities LAD large diffuse 50-75 mid to distal LAD Circumflex large diffuse 50-75 mid to distal RCA very large widely patent proximal to mid stents distal PL branch occluded TIMI 0 flow PCI of distal PL with 1.5 x 12 mm trek balloon to 8 atm TIMI-3 flow restored from TIMI 0 No stents were placed to small area I am concerned that this may have represented an embolic phenomenon possibly from the ectatic distal portion of the RCA which was proximal to the PL branch Catheters and sheaths were removed and a TR band applied Patient is to be maintained on aspirin and prasugrel for at least 12 months Transferred to ICU for recovery Recommend aggressive medical therapy GDMT Advised patient refrain from tobacco abuse Patient tolerated procedure well No complications   CT Angio Chest Aorta W and/or Wo Contrast Result Date: 09/13/2023 CLINICAL DATA:  Acute aortic syndrome (AAS) suspected chest pain 10 out of 10, started today at 1900, feels like burn. EXAM: CT ANGIOGRAPHY CHEST WITH CONTRAST TECHNIQUE: Multidetector CT imaging of the chest was performed using the standard protocol during bolus administration of intravenous contrast. Multiplanar CT image reconstructions and MIPs were obtained to evaluate the vascular anatomy. RADIATION DOSE REDUCTION: This exam was performed according to the departmental dose-optimization program which includes automated exposure control, adjustment of the mA and/or kV according to patient size and/or use of iterative reconstruction technique. CONTRAST:  75mL OMNIPAQUE IOHEXOL 350 MG/ML SOLN COMPARISON:  CT chest 05/31/2023 FINDINGS: Cardiovascular: Satisfactory opacification of the pulmonary arteries to the segmental level. No evidence of pulmonary embolism. Normal heart size. No significant pericardial effusion. The thoracic aorta is normal in caliber. No  atherosclerotic plaque of the thoracic aorta. At least 2 vessel coronary artery calcifications. Mediastinum/Nodes: No enlarged mediastinal, hilar, or axillary lymph nodes. Thyroid  gland, trachea, and esophagus demonstrate no significant findings. Lungs/Pleura: Diffuse mild bronchial wall thickening. No focal consolidation. No pulmonary nodule. No pulmonary mass. No pleural effusion. No pneumothorax. Upper Abdomen: No acute abnormality.  Splenule noted. Musculoskeletal: No chest wall abnormality. No suspicious lytic or blastic osseous lesions. No acute displaced fracture. Review of the MIP images confirms the above findings. IMPRESSION: 1. No pulmonary embolus. 2. No acute intrathoracic abnormality. 3. At least 2 vessel coronary artery calcification. Electronically Signed  By: Morgane  Naveau M.D.   On: 09/13/2023 00:23   DG Chest Port 1 View Result Date: 09/12/2023 CLINICAL DATA:  Sepsis, chest pain EXAM: PORTABLE CHEST 1 VIEW COMPARISON:  05/07/2020 FINDINGS: Single frontal view of the chest demonstrates an unremarkable cardiac silhouette. No acute airspace disease, effusion, or pneumothorax. No acute bony abnormalities. IMPRESSION: 1. No acute intrathoracic process. Electronically Signed   By: Bobbye Burrow M.D.   On: 09/12/2023 23:06     ECHO as above  TELEMETRY reviewed by me 09/14/23: Sinus rhythm, rate 70s  EKG reviewed by me 09/14/23: Initial EKG with ST elevation in inferior leads with Q waves.  DATA reviewed by me 09/14/23: last 24h vitals tele labs imaging I/O hospitalist progress notes.  Principal Problem:   NSTEMI (non-ST elevated myocardial infarction) (HCC) Active Problems:   Hypertension   Diabetes mellitus without complication (HCC)   Coronary artery disease involving native coronary artery of native heart without angina pectoris   Class 1 obesity due to excess calories with body mass index (BMI) of 30.0 to 30.9 in adult   Tobacco use disorder   OSA (obstructive sleep  apnea)   History of CVA (cerebrovascular accident)   STEMI (ST elevation myocardial infarction) (HCC)    ASSESSMENT AND PLAN: Calvin Ortiz is a 52 y.o. male with a past medical history of CAD s/p with PCI stent to RCA (2017), hypertension, OSA, tobacco use disorder, history of stroke who presented to the ED on 09/12/2023 for severe chest pain that improved with s/p nitroglycerin  and aspirin at home.  Recurrence of chest pain occurred prior to arrival to ED and serial EKGs revealed diagnostic for STEMI.  Patient underwent emergent LHC on 05/26 and received PCI of distal PL branch.   # STEMI s/p PCI of distal PL branch (09/14/23) # CAD status post stent to RCA (2017) # Hypertension Patient presents to ED with chest pain and EKG revealed elevation in inferior leads with Q waves.  Trops 9 > 84. Patient underwent emergent left heart cath on 05/26 with Dr. Beau Bound and received PCI of distal PL branch.  Patient denies any recurrence of chest pain. Ehco this admission with pEF (65-70%) with no RWMA.  -Continue ASA 81 mg and Plavix 75 mg daily.  Uninterrupted for at least 12 months. -Continue atorvastatin  80 mg daily, Zetia 10 mg daily -Continue Coreg  25 mg twice daily, amlodipine  10 mg daily. -OSA recommend outpatient sleep study. -Follow-up with cardiac rehab.  Ok for discharge today from a cardiac perspective. Will arrange for follow up in clinic with Dr. Beau Bound in 1-2 weeks.   This patient's case was discussed and created with Dr. Custovic and she is in agreement.  Signed:  Creighton Doffing, PA-C  09/14/2023, 2:06 PM [facility]

## 2023-09-14 NOTE — Plan of Care (Signed)

## 2023-09-14 NOTE — Discharge Summary (Signed)
 Calvin Ortiz ZOX:[id number] DOB: Dec 07, 1970 DOA: 09/12/2023  PCP: Carollynn Cirri, NP  Admit date: 09/12/2023 Discharge date: 09/14/2023  Time spent: 35 minutes  Recommendations for Outpatient Follow-up:  Pcp f/u Cardiology f/u in about 1 week     Discharge Diagnoses:  Principal Problem:   NSTEMI (non-ST elevated myocardial infarction) [facility]) Active Problems:   Hypertension   Diabetes mellitus without complication (HCC)   Coronary artery disease involving native coronary artery of native heart without angina pectoris   Class 1 obesity due to excess calories with body mass index (BMI) of 30.0 to 30.9 in adult   Tobacco use disorder   OSA (obstructive sleep apnea)   History of CVA (cerebrovascular accident)   STEMI (ST elevation myocardial infarction) (HCC)   Discharge Condition: stable  Diet recommendation: heart healthy  Filed Weights   09/12/23 2221 09/13/23 0600  Weight: 80.7 kg 85.9 kg    History of present illness:  From admission h and p  HPI: Calvin Ortiz is a 53 y.o. male with medical history significant for CAD with MI 2017 s/p stent, HTN, OSA, tobacco use disorder, prior stroke being admitted with NSTEMI after presenting with 10 /10 crushing retrosternal chest pain with only temporary response to home nitroglycerin  x 4 and aspirin 324 mg, with pain resuming while in the ED ED course and data review: BP was 171/115 on arrival with otherwise normal vitalsThen Labs notable for troponin 9--84 Creatinine 1.25, near baseline of 1.19   EKG, personally viewed and interpreted showing sinus at 64, not meeting STEMI criteria   Chest x-ray clear   CTA aorta was done in the ED due to recurrent pain.  Negative for PE or acute intrathoracic abnormality.  At least two-vessel coronary artery calcification   Patient treated with fentanyl for pain, started on heparin infusion    Hospital Course:  Hx CAD presenting with chest pain, diagnosed with nstemi,  admitted and started on heparin infusion. Developed worsening chest pain and EKG showed STEMI so taken emergently to cath lab. EF 45-50, multi-vessel CAD with PCI angioplasty to distal PL branch. Chest pain now resolved, hemodynamically stable, unremarkable post-cath TTE. Discharged with addition of plavix and increased dose of atorvastatin , cardiac rehab ordered, will f/u with cardiology in one week. Smoking cessation advised.   Procedures: Left heart cath   Consultations: cardiology  Discharge Exam: Vitals:   09/14/23 1100 09/14/23 1200  BP: (!) 129/111 (!) 155/99  Pulse: (!) 56 61  Resp: 18 20  Temp:    SpO2:  97%    General: NAD Cardiovascular: RRR Respiratory: CTAB  Discharge Instructions   Discharge Instructions     AMB Referral to Cardiac Rehabilitation - Phase II   Complete by: As directed    Diagnosis:  PTCA STEMI     After initial evaluation and assessments completed: Virtual Based Care may be provided alone or in conjunction with Phase 2 Cardiac Rehab based on patient barriers.: Yes   Diet - low sodium heart healthy   Complete by: As directed    Increase activity slowly   Complete by: As directed       Allergies as of 09/14/2023       Reactions   Buprenorphine Hcl    Other reaction(s): Other (See Comments), Other (See Comments)   Morphine And Codeine    Sulfa Antibiotics Rash        Medication List     TAKE these medications    acetaminophen  500 MG tablet Commonly  known as: TYLENOL  Take 1,000 mg by mouth every 6 (six) hours as needed.   amLODipine  10 MG tablet Commonly known as: NORVASC  TAKE 1 TABLET(10 MG) BY MOUTH DAILY   aspirin EC 81 MG tablet Take 81 mg by mouth daily.   atorvastatin  80 MG tablet Commonly known as: LIPITOR Take 1 tablet (80 mg total) by mouth daily. Start taking on: Sep 15, 2023 What changed:  medication strength how much to take how to take this when to take this additional instructions   benazepril  40 MG  tablet Commonly known as: LOTENSIN  TAKE 1 TABLET(40 MG) BY MOUTH DAILY   buPROPion  150 MG 24 hr tablet Commonly known as: Wellbutrin  XL Take 1 tablet (150 mg total) by mouth daily.   carvedilol  25 MG tablet Commonly known as: COREG  TAKE 1 TABLET(25 MG) BY MOUTH TWICE DAILY WITH A MEAL   clopidogrel 75 MG tablet Commonly known as: Plavix Take 1 tablet (75 mg total) by mouth daily.   fenofibrate  145 MG tablet Commonly known as: TRICOR  TAKE 1 TABLET(145 MG) BY MOUTH DAILY   fexofenadine  180 MG tablet Commonly known as: ALLEGRA  Take 1 tablet (180 mg total) by mouth daily.   FLUoxetine  40 MG capsule Commonly known as: PROZAC  TAKE 1 CAPSULE(40 MG) BY MOUTH DAILY   fluticasone  50 MCG/ACT nasal spray Commonly known as: FLONASE  2 sprays in each nostril daily   hydrOXYzine  25 MG tablet Commonly known as: ATARAX  TAKE 2 TABLETS BY MOUTH EVERY NIGHT AT BEDTIME. MAY ALSO TAKE 1/2- 1 TABLET 2 TIMES DAILY AS NEEDED FOR ANXIETY   isosorbide  mononitrate 120 MG 24 hr tablet Commonly known as: IMDUR  TAKE 1 TABLET(120 MG) BY MOUTH DAILY   nitroGLYCERIN  0.4 MG SL tablet Commonly known as: NITROSTAT  PLACE 1 TABLET UNDER THE TONGUE EVERY 5 MINUTES AS NEEDED FOR CHEST PAIN. MAY TAKE UP TO 3 DOSES   pantoprazole  20 MG tablet Commonly known as: PROTONIX  TAKE 1 TABLET(20 MG) BY MOUTH DAILY       Allergies  Allergen Reactions   Buprenorphine Hcl     Other reaction(s): Other (See Comments), Other (See Comments)   Morphine And Codeine    Sulfa Antibiotics Rash    Follow-up Information     Callwood, Dwayne D, MD. Go in 1 week(s).   Specialties: Cardiology, Internal Medicine Contact information: [address] [place] [address] [phone]         Carollynn Cirri, NP Follow up.   Specialties: Internal Medicine, Emergency Medicine Contact information: [address] [place] [address] [phone]                  The results of significant diagnostics  from this hospitalization (including imaging, microbiology, ancillary and laboratory) are listed below for reference.    Significant Diagnostic Studies: ECHOCARDIOGRAM COMPLETE Result Date: 09/14/2023    ECHOCARDIOGRAM REPORT   Patient Name:   Calvin Ortiz Date of Exam: 09/14/2023 Medical Rec #:  [id number]             Height:       64.0 in Accession #:    [id number]            Weight:       189.4 lb Date of Birth:  [birth year]              BSA:          1.912 m Patient Age:    52 years  BP:           155/99 mmHg Patient Gender: M                     HR:           61 bpm. Exam Location:  [facility] Procedure: 2D Echo, Color Doppler and Cardiac Doppler (Both Spectral and Color            Flow Doppler were utilized during procedure). Indications:     NSTEMI I21.4  History:         Patient has no prior history of Echocardiogram examinations.                  Previous Myocardial Infarction, Stroke; Risk Factors:Diabetes                  and Hypertension.  Sonographer:     Broadus Canes Referring Phys:  [id number] Creighton Doffing Diagnosing Phys: Percival Brace MD IMPRESSIONS  1. Left ventricular ejection fraction, by estimation, is 65 to 70%. The left ventricle has normal function. The left ventricle has no regional wall motion abnormalities. Left ventricular diastolic parameters are consistent with Grade I diastolic dysfunction (impaired relaxation).  2. Right ventricular systolic function is normal. The right ventricular size is normal.  3. The mitral valve is normal in structure. Trivial mitral valve regurgitation. No evidence of mitral stenosis.  4. The aortic valve is normal in structure. Aortic valve regurgitation is not visualized. No aortic stenosis is present.  5. The inferior vena cava is normal in size with greater than 50% respiratory variability, suggesting right atrial pressure of 3 mmHg. FINDINGS  Left Ventricle: Left ventricular ejection fraction, by estimation, is 65 to 70%. The left  ventricle has normal function. The left ventricle has no regional wall motion abnormalities. Strain was performed and the global longitudinal strain is indeterminate. The left ventricular internal cavity size was normal in size. There is no left ventricular hypertrophy. Left ventricular diastolic parameters are consistent with Grade I diastolic dysfunction (impaired relaxation). Right Ventricle: The right ventricular size is normal. No increase in right ventricular wall thickness. Right ventricular systolic function is normal. Left Atrium: Left atrial size was normal in size. Right Atrium: Right atrial size was normal in size. Pericardium: There is no evidence of pericardial effusion. Mitral Valve: The mitral valve is normal in structure. Trivial mitral valve regurgitation. No evidence of mitral valve stenosis. MV peak gradient, 2.5 mmHg. The mean mitral valve gradient is 1.0 mmHg. Tricuspid Valve: The tricuspid valve is normal in structure. Tricuspid valve regurgitation is trivial. No evidence of tricuspid stenosis. Aortic Valve: The aortic valve is normal in structure. Aortic valve regurgitation is not visualized. No aortic stenosis is present. Aortic valve mean gradient measures 3.0 mmHg. Aortic valve peak gradient measures 5.8 mmHg. Aortic valve area, by VTI measures 4.07 cm. Pulmonic Valve: The pulmonic valve was normal in structure. Pulmonic valve regurgitation is not visualized. No evidence of pulmonic stenosis. Aorta: The aortic root is normal in size and structure. Venous: The inferior vena cava is normal in size with greater than 50% respiratory variability, suggesting right atrial pressure of 3 mmHg. IAS/Shunts: No atrial level shunt detected by color flow Doppler. Additional Comments: 3D was performed not requiring image post processing on an independent workstation and was indeterminate.  LEFT VENTRICLE PLAX 2D LVIDd:         4.70 cm   Diastology LVIDs:  2.90 cm   LV e' medial:    9.03 cm/s LV  PW:         1.00 cm   LV E/e' medial:  7.2 LV IVS:        1.20 cm   LV e' lateral:   8.59 cm/s LVOT diam:     2.20 cm   LV E/e' lateral: 7.6 LV SV:         76 LV SV Index:   40 LVOT Area:     3.80 cm  RIGHT VENTRICLE RV Basal diam:  4.20 cm RV Mid diam:    3.50 cm LEFT ATRIUM             Index        RIGHT ATRIUM           Index LA diam:        3.40 cm 1.78 cm/m   RA Area:     17.80 cm LA Vol (A2C):   20.3 ml 10.62 ml/m  RA Volume:   50.30 ml  26.31 ml/m LA Vol (A4C):   16.1 ml 8.42 ml/m LA Biplane Vol: 18.1 ml 9.47 ml/m  AORTIC VALVE AV Area (Vmax):    3.71 cm AV Area (Vmean):   3.84 cm AV Area (VTI):     4.07 cm AV Vmax:           120.00 cm/s AV Vmean:          77.800 cm/s AV VTI:            0.187 m AV Peak Grad:      5.8 mmHg AV Mean Grad:      3.0 mmHg LVOT Vmax:         117.00 cm/s LVOT Vmean:        78.600 cm/s LVOT VTI:          0.200 m LVOT/AV VTI ratio: 1.07  AORTA Ao Root diam: 2.90 cm MITRAL VALVE MV Area (PHT): 3.08 cm    SHUNTS MV Area VTI:   2.89 cm    Systemic VTI:  0.20 m MV Peak grad:  2.5 mmHg    Systemic Diam: 2.20 cm MV Mean grad:  1.0 mmHg MV Vmax:       0.80 m/s MV Vmean:      48.1 cm/s MV Decel Time: 246 msec MV E velocity: 65.00 cm/s MV A velocity: 77.00 cm/s MV E/A ratio:  0.84 Percival Brace MD Electronically signed by Percival Brace MD Signature Date/Time: 09/14/2023/1:56:44 PM    Final    CARDIAC CATHETERIZATION Result Date: 09/13/2023   Prox RCA to Mid RCA lesion is 10% stenosed.   Dist RCA-1 lesion is 50% stenosed.   RPAV lesion is 100% stenosed.   Dist RCA-2 lesion is 50% stenosed with 50% stenosed side branch in RPDA.   Mid LAD to Dist LAD lesion is 75% stenosed.   Mid Cx to Dist Cx lesion is 50% stenosed.   There is mild left ventricular systolic dysfunction.   LV end diastolic pressure is normal.   The left ventricular ejection fraction is 45-50% by visual estimate.   In the absence of any other complications or medical issues, we expect the patient to be  ready for discharge from an interventional cardiology perspective.   Recommend uninterrupted dual antiplatelet therapy with Aspirin 81mg  daily and Prasugrel 10mg  daily for a minimum of 12 months (ACS-Class I recommendation). Conclusion In-house STEMI admitted from  the emergency room EKG suggestive  of acute inferior Treated with prasugrel heparin nitroglycerin  Right radial approach Left ventriculogram showed borderline normal left ventricular function EF around 50% Coronaries Left main large minor irregularities LAD large diffuse 50-75 mid to distal LAD Circumflex large diffuse 50-75 mid to distal RCA very large widely patent proximal to mid stents distal PL branch occluded TIMI 0 flow PCI of distal PL with 1.5 x 12 mm trek balloon to 8 atm TIMI-3 flow restored from TIMI 0 No stents were placed to small area I am concerned that this may have represented an embolic phenomenon possibly from the ectatic distal portion of the RCA which was proximal to the PL branch Catheters and sheaths were removed and a TR band applied Patient is to be maintained on aspirin and prasugrel for at least 12 months Transferred to ICU for recovery Recommend aggressive medical therapy GDMT Advised patient refrain from tobacco abuse Patient tolerated procedure well No complications   CT Angio Chest Aorta W and/or Wo Contrast Result Date: 09/13/2023 CLINICAL DATA:  Acute aortic syndrome (AAS) suspected chest pain 10 out of 10, started today at 1900, feels like burn. EXAM: CT ANGIOGRAPHY CHEST WITH CONTRAST TECHNIQUE: Multidetector CT imaging of the chest was performed using the standard protocol during bolus administration of intravenous contrast. Multiplanar CT image reconstructions and MIPs were obtained to evaluate the vascular anatomy. RADIATION DOSE REDUCTION: This exam was performed according to the departmental dose-optimization program which includes automated exposure control, adjustment of the mA and/or kV according to patient  size and/or use of iterative reconstruction technique. CONTRAST:  75mL OMNIPAQUE IOHEXOL 350 MG/ML SOLN COMPARISON:  CT chest 05/31/2023 FINDINGS: Cardiovascular: Satisfactory opacification of the pulmonary arteries to the segmental level. No evidence of pulmonary embolism. Normal heart size. No significant pericardial effusion. The thoracic aorta is normal in caliber. No atherosclerotic plaque of the thoracic aorta. At least 2 vessel coronary artery calcifications. Mediastinum/Nodes: No enlarged mediastinal, hilar, or axillary lymph nodes. Thyroid  gland, trachea, and esophagus demonstrate no significant findings. Lungs/Pleura: Diffuse mild bronchial wall thickening. No focal consolidation. No pulmonary nodule. No pulmonary mass. No pleural effusion. No pneumothorax. Upper Abdomen: No acute abnormality.  Splenule noted. Musculoskeletal: No chest wall abnormality. No suspicious lytic or blastic osseous lesions. No acute displaced fracture. Review of the MIP images confirms the above findings. IMPRESSION: 1. No pulmonary embolus. 2. No acute intrathoracic abnormality. 3. At least 2 vessel coronary artery calcification. Electronically Signed   By: Morgane  Naveau M.D.   On: 09/13/2023 00:23   DG Chest Port 1 View Result Date: 09/12/2023 CLINICAL DATA:  Sepsis, chest pain EXAM: PORTABLE CHEST 1 VIEW COMPARISON:  05/07/2020 FINDINGS: Single frontal view of the chest demonstrates an unremarkable cardiac silhouette. No acute airspace disease, effusion, or pneumothorax. No acute bony abnormalities. IMPRESSION: 1. No acute intrathoracic process. Electronically Signed   By: Bobbye Burrow M.D.   On: 09/12/2023 23:06   DG Knee Complete 4 Views Left Result Date: 08/26/2023 CLINICAL DATA:  Prior chronic bilateral knee pain. EXAM: LEFT KNEE - COMPLETE 4+ VIEW COMPARISON:  None Available. FINDINGS: Minimal peripheral medial compartment and superior and lateral patellar degenerative spurring. The joint spaces are preserved. No  joint effusion. No acute fracture or dislocation. IMPRESSION: Minimal medial and patellofemoral compartment osteoarthritis. Electronically Signed   By: Bertina Broccoli M.D.   On: 08/26/2023 17:17   DG Knee Complete 4 Views Right Result Date: 08/26/2023 CLINICAL DATA:  Chronic bilateral knee pain.  No known injury. EXAM: RIGHT KNEE - COMPLETE 4+ VIEW  COMPARISON:  None Available. FINDINGS: Minimal peripheral medial compartment and superior and lateral patellar degenerative spurring. No joint effusion. No significant joint space narrowing. No acute fracture or dislocation. IMPRESSION: Minimal medial and patellofemoral compartment osteoarthritis. Electronically Signed   By: Bertina Broccoli M.D.   On: 08/26/2023 17:17    Microbiology: Recent Results (from the past 240 hours)  MRSA Next Gen by PCR, Nasal     Status: None   Collection Time: 09/13/23  6:00 AM   Specimen: Nasal Mucosa; Nasal Swab  Result Value Ref Range Status   MRSA by PCR Next Gen NOT DETECTED NOT DETECTED Final    Comment: (NOTE) The GeneXpert MRSA Assay (FDA approved for NASAL specimens only), is one component of a comprehensive MRSA colonization surveillance program. It is not intended to diagnose MRSA infection nor to guide or monitor treatment for MRSA infections. Test performance is not FDA approved in patients less than 28 years old. Performed at [facility], [address] [place], [place] [address]      Labs: Basic Metabolic Panel: Recent Labs  Lab 09/12/23 2220 09/14/23 0348  NA 133* 139  K 3.8 3.6  CL 100 109  CO2 23 22  GLUCOSE 98 89  BUN 13 15  CREATININE 1.25* 0.98  CALCIUM  8.0* 8.8*   Liver Function Tests: No results for input(s): "AST", "ALT", "ALKPHOS", "BILITOT", "PROT", "ALBUMIN" in the last 168 hours. No results for input(s): "LIPASE", "AMYLASE" in the last 168 hours. No results for input(s): "AMMONIA" in the last 168 hours. CBC: Recent Labs  Lab 09/12/23 2220 09/14/23 0348  WBC  12.0* 8.8  HGB 15.4 15.1  HCT 44.4 43.8  MCV 82.1 81.6  PLT 251 224   Cardiac Enzymes: No results for input(s): "CKTOTAL", "CKMB", "CKMBINDEX", "TROPONINI" in the last 168 hours. BNP: BNP (last 3 results) No results for input(s): "BNP" in the last 8760 hours.  ProBNP (last 3 results) No results for input(s): "PROBNP" in the last 8760 hours.  CBG: Recent Labs  Lab 09/13/23 0549  GLUCAP 104*       Signed:  Raymonde Calico MD.  [facility] 09/14/2023, 2:23 PM

## 2023-09-15 LAB — LIPOPROTEIN A (LPA): Lipoprotein (a): 18.9 nmol/L (ref <75.0)

## 2023-09-17 ENCOUNTER — Other Ambulatory Visit: Payer: Self-pay

## 2023-09-20 ENCOUNTER — Other Ambulatory Visit: Payer: Self-pay

## 2023-09-21 ENCOUNTER — Encounter: Payer: Self-pay | Admitting: Internal Medicine

## 2023-09-21 ENCOUNTER — Other Ambulatory Visit: Payer: Self-pay

## 2023-09-21 ENCOUNTER — Ambulatory Visit: Admitting: Internal Medicine

## 2023-09-21 VITALS — BP 130/78 | HR 54 | Ht 64.0 in | Wt 184.8 lb

## 2023-09-21 DIAGNOSIS — Z8673 Personal history of transient ischemic attack (TIA), and cerebral infarction without residual deficits: Secondary | ICD-10-CM | POA: Diagnosis not present

## 2023-09-21 DIAGNOSIS — E1169 Type 2 diabetes mellitus with other specified complication: Secondary | ICD-10-CM | POA: Diagnosis not present

## 2023-09-21 DIAGNOSIS — I2510 Atherosclerotic heart disease of native coronary artery without angina pectoris: Secondary | ICD-10-CM

## 2023-09-21 DIAGNOSIS — I10 Essential (primary) hypertension: Secondary | ICD-10-CM

## 2023-09-21 DIAGNOSIS — I213 ST elevation (STEMI) myocardial infarction of unspecified site: Secondary | ICD-10-CM

## 2023-09-21 DIAGNOSIS — E119 Type 2 diabetes mellitus without complications: Secondary | ICD-10-CM | POA: Diagnosis not present

## 2023-09-21 DIAGNOSIS — E785 Hyperlipidemia, unspecified: Secondary | ICD-10-CM

## 2023-09-21 DIAGNOSIS — F17200 Nicotine dependence, unspecified, uncomplicated: Secondary | ICD-10-CM

## 2023-09-21 MED ORDER — NICOTINE 14 MG/24HR TD PT24
14.0000 mg | MEDICATED_PATCH | Freq: Every day | TRANSDERMAL | 0 refills | Status: AC
Start: 2023-09-21 — End: ?
  Filled 2023-09-21: qty 28, 28d supply, fill #0

## 2023-09-21 NOTE — Progress Notes (Signed)
 Subjective:    Patient ID: Calvin Ortiz, male    DOB: 1970-11-10, 53 y.o.   MRN: [id number]  HPI  Patient presents to clinic today for TCM hospital follow-up.  He presented to the ER 5/25 with complaint of 10 out of 10 retrosternal chest pain despite nitroglycerin  x 4 and aspirin  at home.  Initial ECG showed sinus rhythm.  He was hypertensive and had a recurrence of chest pain in the ER.  Labs revealed slightly elevated creatinine from his baseline.  Troponin peaked at 84.  ECG was repeated due to recurrent chest pain and he was diagnosed with a STEMI.  He was immediately started on a heparin  drip and taken to the Cath Lab.  PCI was performed of the distal PL.  Echocardiogram showed EF 45 to 50%.  His atorvastatin  was increased to 80 mg daily and he was initiated on plavix .  He was continued on carvedilol , fenofibrate  and isosorbide .  He was discharged on 5/27 with instructions to follow-up with his PCP and cardiology in 1 week.  Since that time, he has not had any episodes of chest pain. He already has an appt scheduled with cardiology. He does desire smoking cessation counseling.   Review of Systems     Past Medical History:  Diagnosis Date   Anxiety    Depression    Diabetes mellitus without complication (HCC)    GERD (gastroesophageal reflux disease)    Hyperlipidemia    Hypertension    Migraines    Myocardial infarction (HCC)    3 Stents   Sleep apnea    Stroke [facility])     Current Outpatient Medications  Medication Sig Dispense Refill   acetaminophen  (TYLENOL ) 500 MG tablet Take 1,000 mg by mouth every 6 (six) hours as needed.     amLODipine  (NORVASC ) 10 MG tablet Take 1 tablet (10 mg total) by mouth daily. 90 tablet 1   aspirin  EC 81 MG tablet Take 81 mg by mouth daily.     atorvastatin  (LIPITOR ) 80 MG tablet Take 1 tablet (80 mg total) by mouth daily. 90 tablet 1   benazepril  (LOTENSIN ) 40 MG tablet Take 1 tablet (40 mg total) by mouth daily. 90 tablet 1   buPROPion   (WELLBUTRIN  XL) 150 MG 24 hr tablet Take 1 tablet (150 mg total) by mouth daily. 90 tablet 1   carvedilol  (COREG ) 25 MG tablet Take 1 tablet (25 mg total) by mouth 2 (two) times daily with a meal. 180 tablet 1   clopidogrel  (PLAVIX ) 75 MG tablet Take 1 tablet (75 mg total) by mouth daily. 90 tablet 1   fenofibrate  (TRICOR ) 145 MG tablet Take 1 tablet (145 mg total) by mouth daily. 90 tablet 1   fexofenadine  (ALLEGRA ) 180 MG tablet Take 1 tablet (180 mg total) by mouth daily. 90 tablet 3   FLUoxetine  (PROZAC ) 40 MG capsule Take 1 capsule (40 mg total) by mouth daily. 90 capsule 1   fluticasone  (FLONASE ) 50 MCG/ACT nasal spray 2 sprays in each nostril daily 16 g 3   hydrOXYzine  (ATARAX ) 25 MG tablet TAKE 2 TABLETS BY MOUTH EVERY NIGHT AT BEDTIME. MAY ALSO TAKE 1/2- 1 TABLET 2 TIMES DAILY AS NEEDED FOR ANXIETY 270 tablet 1   isosorbide  mononitrate (IMDUR ) 120 MG 24 hr tablet Take 1 tablet (120 mg total) by mouth daily. 90 tablet 1   nitroGLYCERIN  (NITROSTAT ) 0.4 MG SL tablet PLACE 1 TABLET UNDER THE TONGUE EVERY 5 MINUTES AS NEEDED FOR CHEST PAIN. MAY TAKE  UP TO 3 DOSES 25 tablet 1   pantoprazole  (PROTONIX ) 20 MG tablet Take 1 tablet (20 mg total) by mouth daily. 90 tablet 1   No current facility-administered medications for this visit.    Allergies  Allergen Reactions   Buprenorphine Hcl     Other reaction(s): Other (See Comments), Other (See Comments)   Morphine And Codeine    Sulfa Antibiotics Rash    Family History  Problem Relation Age of Onset   COPD Mother    Hypertension Mother    Hyperlipidemia Mother    Depression Mother    Heart disease Mother    COPD Father    Diabetes Father    Heart disease Father    Stroke Father    Cancer Father        pelvic mass   Lung cancer Father    Depression Father    Diabetes Paternal Grandmother     Social History   Socioeconomic History   Marital status: Single    Spouse name: Not on file   Number of children: Not on file   Years  of education: Not on file   Highest education level: Not on file  Occupational History   Not on file  Tobacco Use   Smoking status: Every Day    Current packs/day: 0.00    Types: Cigarettes    Last attempt to quit: 04/13/1993    Years since quitting: 30.4   Smokeless tobacco: Never   Tobacco comments:    Has quit off and on about 5-6 times (09/2017)  Vaping Use   Vaping status: Never Used  Substance and Sexual Activity   Alcohol use: No   Drug use: No   Sexual activity: Not Currently  Other Topics Concern   Not on file  Social History Narrative   Not on file   Social Drivers of Health   Financial Resource Strain: Not on file  Food Insecurity: No Food Insecurity (09/13/2023)   Hunger Vital Sign    Worried About Running Out of Food in the Last Year: Never true    Ran Out of Food in the Last Year: Never true  Transportation Needs: No Transportation Needs (09/13/2023)   PRAPARE - Administrator, Civil Service (Medical): No    Lack of Transportation (Non-Medical): No  Physical Activity: Not on file  Stress: Not on file  Social Connections: Not on file  Intimate Partner Violence: Not At Risk (09/13/2023)   Humiliation, Afraid, Rape, and Kick questionnaire    Fear of Current or Ex-Partner: No    Emotionally Abused: No    Physically Abused: No    Sexually Abused: No     Constitutional: Denies fever, malaise, fatigue, headache or abrupt weight changes.  HEENT: Denies eye pain, eye redness, ear pain, ringing in the ears, wax buildup, runny nose, nasal congestion, bloody nose, or sore throat. Respiratory: Denies difficulty breathing, shortness of breath, cough or sputum production.   Cardiovascular: Denies chest pain, chest tightness, palpitations or swelling in the hands or feet.  Gastrointestinal: Denies abdominal pain, bloating, constipation, diarrhea or blood in the stool.  GU: Denies urgency, frequency, pain with urination, burning sensation, blood in urine, odor  or discharge. Musculoskeletal: Pt reports knee pain, intermittent low back pain. Denies decrease in range of motion, difficulty with gait, muscle pain or joint swelling.  Skin: Pt reports bruising to to the right arm. Denies redness, rashes, lesions or ulcercations.  Neurological: Denies dizziness, difficulty with memory, difficulty  with speech or problems with balance and coordination.  Psych: Patient has a history of anxiety and depression.  Denies SI/HI.  No other specific complaints in a complete review of systems (except as listed in HPI above).  Objective:   Physical Exam BP 130/78 (BP Location: Left Arm, Patient Position: Sitting, Cuff Size: Normal)   Pulse (!) 54   Ht 5\' 4"  (1.626 m)   Wt 184 lb 12.8 oz (83.8 kg)   SpO2 100%   BMI 31.72 kg/m     Wt Readings from Last 3 Encounters:  09/13/23 189 lb 6 oz (85.9 kg)  08/20/23 185 lb 6.4 oz (84.1 kg)  05/31/23 178 lb (80.7 kg)    General: Appears his stated age, obese, in NAD. Skin: Warm, dry and intact.  Significant bruising noted of the right upper extremity without hematoma.  No ulcerations noted. HEENT: Head: normal shape and size; Eyes: sclera white, no icterus, conjunctiva pink, PERRLA and EOMs intact;  Cardiovascular: Bradycardic with normal rhythm. S1,S2 noted.  No murmur, rubs or gallops noted.  Radial pulse 2+ on the right.  No JVD or BLE edema. No carotid bruits noted. Pulmonary/Chest: Normal effort and positive vesicular breath sounds. No respiratory distress. No wheezes, rales or ronchi noted.  Musculoskeletal:  No difficulty with gait.  Neurological: Alert and oriented. Coordination normal.    BMET    Component Value Date/Time   NA 139 09/14/2023 0348   NA 138 12/10/2016 1902   NA 140 02/03/2012 0313   K 3.6 09/14/2023 0348   K 3.5 02/03/2012 0313   CL 109 09/14/2023 0348   CL 106 02/03/2012 0313   CO2 22 09/14/2023 0348   CO2 22 02/03/2012 0313   GLUCOSE 89 09/14/2023 0348   GLUCOSE 151 (H) 02/03/2012  0313   BUN 15 09/14/2023 0348   BUN 15 12/10/2016 1902   BUN 9 02/03/2012 0313   CREATININE 0.98 09/14/2023 0348   CREATININE 1.14 08/20/2023 1510   CALCIUM  8.8 (L) 09/14/2023 0348   CALCIUM  9.2 02/03/2012 0313   GFRNONAA >60 09/14/2023 0348   GFRNONAA 66 08/17/2019 0948   GFRAA 77 08/17/2019 0948    Lipid Panel     Component Value Date/Time   CHOL 131 08/20/2023 1510   CHOL 139 12/10/2016 1902   TRIG 122 08/20/2023 1510   HDL 39 (L) 08/20/2023 1510   HDL 22 (L) 12/10/2016 1902   CHOLHDL 3.4 08/20/2023 1510   LDLCALC 72 08/20/2023 1510    CBC    Component Value Date/Time   WBC 8.8 09/14/2023 0348   RBC 5.37 09/14/2023 0348   HGB 15.1 09/14/2023 0348   HGB 14.9 04/02/2016 1939   HCT 43.8 09/14/2023 0348   HCT 43.6 04/02/2016 1939   PLT 224 09/14/2023 0348   PLT 268 04/02/2016 1939   MCV 81.6 09/14/2023 0348   MCV 86 04/02/2016 1939   MCV 87 02/03/2012 0313   MCH 28.1 09/14/2023 0348   MCHC 34.5 09/14/2023 0348   RDW 12.6 09/14/2023 0348   RDW 13.3 04/02/2016 1939   RDW 13.0 02/03/2012 0313   LYMPHSABS 3,717 08/17/2019 0948   LYMPHSABS 3.3 (H) 04/02/2016 1939   EOSABS 351 08/17/2019 0948   EOSABS 0.3 04/02/2016 1939   BASOSABS 99 08/17/2019 0948   BASOSABS 0.1 04/02/2016 1939    Hgb A1C Lab Results  Component Value Date   HGBA1C 5.7 (H) 08/20/2023           Assessment & Plan:   TCM  hospital follow-up for STEMI, HTN, HLD with CAD status post stents, history of stroke, DM2:  Hospital notes, labs and imaging reviewed No repeat labs indicated today Medications reviewed, continue as is Rx for NicoDerm patches 14 mg daily for the next 4 weeks, then plan to decrease to 7 mg daily Keep your follow-up appointment with cardiology as previously scheduled  RTC in 5 months for your annual exam Helayne Lo, NP

## 2023-09-21 NOTE — Addendum Note (Signed)
 Addended by: Carollynn Cirri on: 09/21/2023 02:09 PM   Modules accepted: Level of Service

## 2023-09-21 NOTE — Patient Instructions (Signed)

## 2023-09-24 ENCOUNTER — Other Ambulatory Visit: Payer: Self-pay

## 2023-09-24 DIAGNOSIS — Z720 Tobacco use: Secondary | ICD-10-CM | POA: Diagnosis not present

## 2023-09-24 DIAGNOSIS — E782 Mixed hyperlipidemia: Secondary | ICD-10-CM | POA: Diagnosis not present

## 2023-09-24 DIAGNOSIS — I2510 Atherosclerotic heart disease of native coronary artery without angina pectoris: Secondary | ICD-10-CM | POA: Diagnosis not present

## 2023-09-24 DIAGNOSIS — I10 Essential (primary) hypertension: Secondary | ICD-10-CM | POA: Diagnosis not present

## 2023-09-24 DIAGNOSIS — G4733 Obstructive sleep apnea (adult) (pediatric): Secondary | ICD-10-CM | POA: Diagnosis not present

## 2023-09-24 DIAGNOSIS — I252 Old myocardial infarction: Secondary | ICD-10-CM | POA: Diagnosis not present

## 2023-09-24 DIAGNOSIS — Z9861 Coronary angioplasty status: Secondary | ICD-10-CM | POA: Diagnosis not present

## 2023-09-24 MED ORDER — CLOPIDOGREL BISULFATE 75 MG PO TABS
75.0000 mg | ORAL_TABLET | Freq: Every day | ORAL | 3 refills | Status: AC
  Filled 2023-09-24 – 2023-12-29 (×2): qty 90, 90d supply, fill #0
  Filled 2024-03-27: qty 90, 90d supply, fill #1

## 2023-09-24 MED ORDER — EZETIMIBE 10 MG PO TABS
10.0000 mg | ORAL_TABLET | Freq: Every day | ORAL | 3 refills | Status: AC
  Filled 2023-09-24: qty 90, 90d supply, fill #0
  Filled 2023-12-21: qty 90, 90d supply, fill #1
  Filled 2024-03-17: qty 90, 90d supply, fill #2

## 2023-10-07 ENCOUNTER — Encounter: Payer: Self-pay | Admitting: *Deleted

## 2023-10-07 ENCOUNTER — Encounter: Attending: Internal Medicine | Admitting: *Deleted

## 2023-10-07 DIAGNOSIS — Z955 Presence of coronary angioplasty implant and graft: Secondary | ICD-10-CM

## 2023-10-07 DIAGNOSIS — I213 ST elevation (STEMI) myocardial infarction of unspecified site: Secondary | ICD-10-CM

## 2023-10-07 NOTE — Progress Notes (Signed)
 Virtual orientation call completed today. he has an appointment on Date: 10/11/2023 for EP eval and gym Orientation.  Documentation of diagnosis can be found in [facility] 09/12/2023 .  Calvin Ortiz is a current tobacco user. Intervention for tobacco cessation was provided at the initial medical review. He was asked about readiness to quit and reported he is working on quitting, no date given. Has decreased from 20 cigarettes a day to 4-5 cigarettes a day. He has Wellbutrin  and Nicotine  patches to help with the cessation journey.  . Patient was advised and educated about tobacco cessation using combination therapy, tobacco cessation classes, quit line, and quit smoking apps. Patient demonstrated understanding of this material. Staff will continue to provide encouragement and follow up with the patient throughout the program.

## 2023-10-13 ENCOUNTER — Other Ambulatory Visit: Payer: Self-pay

## 2023-10-13 ENCOUNTER — Ambulatory Visit

## 2023-10-14 ENCOUNTER — Encounter: Payer: Self-pay | Admitting: Internal Medicine

## 2023-10-15 ENCOUNTER — Other Ambulatory Visit: Payer: Self-pay

## 2023-10-15 NOTE — Telephone Encounter (Signed)
 Error, wrong patient

## 2023-10-15 NOTE — Telephone Encounter (Signed)
 Can you please cancel this patients appointment?

## 2023-10-18 ENCOUNTER — Other Ambulatory Visit: Payer: Self-pay

## 2023-10-21 ENCOUNTER — Other Ambulatory Visit: Payer: Self-pay

## 2023-11-08 ENCOUNTER — Other Ambulatory Visit: Payer: Self-pay

## 2023-11-09 ENCOUNTER — Other Ambulatory Visit: Payer: Self-pay

## 2023-12-21 ENCOUNTER — Other Ambulatory Visit: Payer: Self-pay

## 2023-12-30 ENCOUNTER — Other Ambulatory Visit: Payer: Self-pay

## 2024-01-06 ENCOUNTER — Other Ambulatory Visit (HOSPITAL_COMMUNITY): Payer: Self-pay

## 2024-01-17 ENCOUNTER — Other Ambulatory Visit: Payer: Self-pay

## 2024-01-19 ENCOUNTER — Other Ambulatory Visit: Payer: Self-pay

## 2024-02-14 ENCOUNTER — Other Ambulatory Visit: Payer: Self-pay

## 2024-02-14 ENCOUNTER — Other Ambulatory Visit: Payer: Self-pay | Admitting: Internal Medicine

## 2024-02-14 DIAGNOSIS — E119 Type 2 diabetes mellitus without complications: Secondary | ICD-10-CM

## 2024-02-15 ENCOUNTER — Other Ambulatory Visit: Payer: Self-pay

## 2024-02-15 ENCOUNTER — Other Ambulatory Visit: Payer: Self-pay | Admitting: Medical Genetics

## 2024-02-15 MED FILL — Hydroxyzine HCl Tab 25 MG: ORAL | 30 days supply | Qty: 120 | Fill #0 | Status: AC

## 2024-02-15 NOTE — Telephone Encounter (Signed)
 Requested Prescriptions  Pending Prescriptions Disp Refills   hydrOXYzine  (ATARAX ) 25 MG tablet 270 tablet 1    Sig: TAKE 2 TABLETS BY MOUTH EVERY NIGHT AT BEDTIME. MAY ALSO TAKE 1/2- 1 TABLET 2 TIMES DAILY AS NEEDED FOR ANXIETY     Ear, Nose, and Throat:  Antihistamines 2 Passed - 02/15/2024  5:35 PM      Passed - Cr in normal range and within 360 days    Creat  Date Value Ref Range Status  08/20/2023 1.14 0.70 - 1.30 mg/dL Final   Creatinine, Ser  Date Value Ref Range Status  09/14/2023 0.98 0.61 - 1.24 mg/dL Final   Creatinine, Urine  Date Value Ref Range Status  10/15/2022 240 20 - 320 mg/dL Final         Passed - Valid encounter within last 12 months    Recent Outpatient Visits           4 months ago ST elevation myocardial infarction (STEMI), unspecified artery [facility])   [place] [facility] [place], Angeline ORN, NP   5 months ago Diabetes mellitus without complication [facility])   [place] [facility] [place], Angeline ORN, [place]

## 2024-02-21 ENCOUNTER — Ambulatory Visit: Admitting: Internal Medicine

## 2024-02-21 ENCOUNTER — Encounter: Payer: Self-pay | Admitting: Internal Medicine

## 2024-02-21 VITALS — BP 132/74 | Ht 64.0 in | Wt 196.4 lb

## 2024-02-21 DIAGNOSIS — Z125 Encounter for screening for malignant neoplasm of prostate: Secondary | ICD-10-CM

## 2024-02-21 DIAGNOSIS — E119 Type 2 diabetes mellitus without complications: Secondary | ICD-10-CM | POA: Diagnosis not present

## 2024-02-21 DIAGNOSIS — Z0001 Encounter for general adult medical examination with abnormal findings: Secondary | ICD-10-CM

## 2024-02-21 DIAGNOSIS — Z23 Encounter for immunization: Secondary | ICD-10-CM

## 2024-02-21 NOTE — Assessment & Plan Note (Signed)
 Encouraged diet and exercise for weight loss ?

## 2024-02-21 NOTE — Patient Instructions (Signed)
 Health Maintenance, Male  Adopting a healthy lifestyle and getting preventive care are important in promoting health and wellness. Ask your health care provider about:  The right schedule for you to have regular tests and exams.  Things you can do on your own to prevent diseases and keep yourself healthy.  What should I know about diet, weight, and exercise?  Eat a healthy diet    Eat a diet that includes plenty of vegetables, fruits, low-fat dairy products, and lean protein.  Do not eat a lot of foods that are high in solid fats, added sugars, or sodium.  Maintain a healthy weight  Body mass index (BMI) is a measurement that can be used to identify possible weight problems. It estimates body fat based on height and weight. Your health care provider can help determine your BMI and help you achieve or maintain a healthy weight.  Get regular exercise  Get regular exercise. This is one of the most important things you can do for your health. Most adults should:  Exercise for at least 150 minutes each week. The exercise should increase your heart rate and make you sweat (moderate-intensity exercise).  Do strengthening exercises at least twice a week. This is in addition to the moderate-intensity exercise.  Spend less time sitting. Even light physical activity can be beneficial.  Watch cholesterol and blood lipids  Have your blood tested for lipids and cholesterol at 52 years of age, then have this test every 5 years.  You may need to have your cholesterol levels checked more often if:  Your lipid or cholesterol levels are high.  You are older than 52 years of age.  You are at high risk for heart disease.  What should I know about cancer screening?  Many types of cancers can be detected early and may often be prevented. Depending on your health history and family history, you may need to have cancer screening at various ages. This may include screening for:  Colorectal cancer.  Prostate cancer.  Skin cancer.  Lung  cancer.  What should I know about heart disease, diabetes, and high blood pressure?  Blood pressure and heart disease  High blood pressure causes heart disease and increases the risk of stroke. This is more likely to develop in people who have high blood pressure readings or are overweight.  Talk with your health care provider about your target blood pressure readings.  Have your blood pressure checked:  Every 3-5 years if you are 24-52 years of age.  Every year if you are 3 years old or older.  If you are between the ages of 60 and 72 and are a current or former smoker, ask your health care provider if you should have a one-time screening for abdominal aortic aneurysm (AAA).  Diabetes  Have regular diabetes screenings. This checks your fasting blood sugar level. Have the screening done:  Once every three years after age 66 if you are at a normal weight and have a low risk for diabetes.  More often and at a younger age if you are overweight or have a high risk for diabetes.  What should I know about preventing infection?  Hepatitis B  If you have a higher risk for hepatitis B, you should be screened for this virus. Talk with your health care provider to find out if you are at risk for hepatitis B infection.  Hepatitis C  Blood testing is recommended for:  Everyone born from [birth year] through 1964.  Anyone  with known risk factors for hepatitis C.  Sexually transmitted infections (STIs)  You should be screened each year for STIs, including gonorrhea and chlamydia, if:  You are sexually active and are younger than 53 years of age.  You are older than 52 years of age and your health care provider tells you that you are at risk for this type of infection.  Your sexual activity has changed since you were last screened, and you are at increased risk for chlamydia or gonorrhea. Ask your health care provider if you are at risk.  Ask your health care provider about whether you are at high risk for HIV. Your health care provider  may recommend a prescription medicine to help prevent HIV infection. If you choose to take medicine to prevent HIV, you should first get tested for HIV. You should then be tested every 3 months for as long as you are taking the medicine.  Follow these instructions at home:  Alcohol use  Do not drink alcohol if your health care provider tells you not to drink.  If you drink alcohol:  Limit how much you have to 0-2 drinks a day.  Know how much alcohol is in your drink. In the [place] one drink equals one 12 oz bottle of beer (355 mL), one 5 oz glass of wine (148 mL), or one 1 oz glass of hard liquor (44 mL).  Lifestyle  Do not use any products that contain nicotine or tobacco. These products include cigarettes, chewing tobacco, and vaping devices, such as e-cigarettes. If you need help quitting, ask your health care provider.  Do not use street drugs.  Do not share needles.  Ask your health care provider for help if you need support or information about quitting drugs.  General instructions  Schedule regular health, dental, and eye exams.  Stay current with your vaccines.  Tell your health care provider if:  You often feel depressed.  You have ever been abused or do not feel safe at home.  Summary  Adopting a healthy lifestyle and getting preventive care are important in promoting health and wellness.  Follow your health care provider's instructions about healthy diet, exercising, and getting tested or screened for diseases.  Follow your health care provider's instructions on monitoring your cholesterol and blood pressure.  This information is not intended to replace advice given to you by your health care provider. Make sure you discuss any questions you have with your health care provider.  Document Revised: 08/26/2020 Document Reviewed: 08/26/2020  Elsevier Patient Education  2024 [employer].

## 2024-02-21 NOTE — Progress Notes (Signed)
 Subjective:    Patient ID: Calvin Ortiz, male    DOB: 07-06-1970, 53 y.o.   MRN: [id number]  HPI  Patient presents to clinic today for his annual exam.  Flu: 02/2023 Tetanus: 01/2019 COVID: x 1 Pneumovax: 07/2020 Prevnar 20: Never Shingrix: Never PSA screening: 02/2023 Colon screening: 03/2021 Vision screening: annually Dentist: as needed  Diet: He does eat meat. He consumes fruits and veggies. He does eat some fried foods. He drinks mostly sweet tea. Exercise: Yardwork  Review of Systems     Past Medical History:  Diagnosis Date   Anxiety    Depression    Diabetes mellitus without complication (HCC)    GERD (gastroesophageal reflux disease)    Hyperlipidemia    Hypertension    Migraines    Myocardial infarction (HCC)    3 Stents   Sleep apnea    Stroke [facility])     Current Outpatient Medications  Medication Sig Dispense Refill   acetaminophen  (TYLENOL ) 500 MG tablet Take 1,000 mg by mouth every 6 (six) hours as needed.     amLODipine  (NORVASC ) 10 MG tablet Take 1 tablet (10 mg total) by mouth daily. 90 tablet 1   aspirin  EC 81 MG tablet Take 81 mg by mouth daily.     atorvastatin  (LIPITOR ) 80 MG tablet Take 1 tablet (80 mg total) by mouth daily. 90 tablet 1   benazepril  (LOTENSIN ) 40 MG tablet Take 1 tablet (40 mg total) by mouth daily. 90 tablet 1   buPROPion  (WELLBUTRIN  XL) 150 MG 24 hr tablet Take 1 tablet (150 mg total) by mouth daily. 90 tablet 1   carvedilol  (COREG ) 25 MG tablet Take 1 tablet (25 mg total) by mouth 2 (two) times daily with a meal. 180 tablet 1   clopidogrel  (PLAVIX ) 75 MG tablet Take 1 tablet (75 mg total) by mouth daily. 90 tablet 1   clopidogrel  (PLAVIX ) 75 MG tablet Take 1 tablet (75 mg total) by mouth daily. 90 tablet 3   ezetimibe  (ZETIA ) 10 MG tablet Take 1 tablet (10 mg total) by mouth daily. 90 tablet 3   fenofibrate  (TRICOR ) 145 MG tablet Take 1 tablet (145 mg total) by mouth daily. 90 tablet 1   fexofenadine  (ALLEGRA ) 180 MG  tablet Take 1 tablet (180 mg total) by mouth daily. 90 tablet 3   FLUoxetine  (PROZAC ) 40 MG capsule Take 1 capsule (40 mg total) by mouth daily. 90 capsule 1   fluticasone  (FLONASE ) 50 MCG/ACT nasal spray 2 sprays in each nostril daily 16 g 3   hydrOXYzine  (ATARAX ) 25 MG tablet Take 2 tablets (50 mg total) by mouth at bedtime. May also take 0.5-1 tablets (12.5-25 mg total) 2 (two) times daily as needed for anxiety 270 tablet 1   isosorbide  mononitrate (IMDUR ) 120 MG 24 hr tablet Take 1 tablet (120 mg total) by mouth daily. 90 tablet 1   nicotine  (NICODERM CQ ) 14 mg/24hr patch Place 1 patch (14 mg total) onto the skin daily. 28 patch 0   nitroGLYCERIN  (NITROSTAT ) 0.4 MG SL tablet PLACE 1 TABLET UNDER THE TONGUE EVERY 5 MINUTES AS NEEDED FOR CHEST PAIN. MAY TAKE UP TO 3 DOSES 25 tablet 1   pantoprazole  (PROTONIX ) 20 MG tablet Take 1 tablet (20 mg total) by mouth daily. 90 tablet 1   No current facility-administered medications for this visit.    Allergies  Allergen Reactions   Buprenorphine Hcl     Other reaction(s): Other (See Comments), Other (See Comments)   Morphine  And Codeine    Sulfa Antibiotics Rash    Family History  Problem Relation Age of Onset   COPD Mother    Hypertension Mother    Hyperlipidemia Mother    Depression Mother    Heart disease Mother    COPD Father    Diabetes Father    Heart disease Father    Stroke Father    Cancer Father        pelvic mass   Lung cancer Father    Depression Father    Diabetes Paternal Grandmother     Social History   Socioeconomic History   Marital status: Single    Spouse name: Not on file   Number of children: Not on file   Years of education: Not on file   Highest education level: GED or equivalent  Occupational History   Not on file  Tobacco Use   Smoking status: Every Day    Current packs/day: 0.00    Types: Cigarettes    Last attempt to quit: 04/13/1993    Years since quitting: 30.8   Smokeless tobacco: Never    Tobacco comments:    Has quit off and on about 5-6 times (09/2017)  Vaping Use   Vaping status: Never Used  Substance and Sexual Activity   Alcohol use: No   Drug use: No   Sexual activity: Not Currently  Other Topics Concern   Not on file  Social History Narrative   Not on file   Social Drivers of Health   Financial Resource Strain: Medium Risk (02/17/2024)   Overall Financial Resource Strain (CARDIA)    Difficulty of Paying Living Expenses: Somewhat hard  Food Insecurity: Food Insecurity Present (02/17/2024)   Hunger Vital Sign    Worried About Running Out of Food in the Last Year: Sometimes true    Ran Out of Food in the Last Year: Sometimes true  Transportation Needs: No Transportation Needs (02/17/2024)   PRAPARE - Administrator, Civil Service (Medical): No    Lack of Transportation (Non-Medical): No  Physical Activity: Insufficiently Active (02/17/2024)   Exercise Vital Sign    Days of Exercise per Week: 3 days    Minutes of Exercise per Session: 20 min  Stress: Stress Concern Present (02/17/2024)   [employer] of Occupational Health - Occupational Stress Questionnaire    Feeling of Stress: Very much  Social Connections: Moderately Isolated (02/17/2024)   Social Connection and Isolation Panel    Frequency of Communication with Friends and Family: Three times a week    Frequency of Social Gatherings with Friends and Family: Patient declined    Attends Religious Services: Never    Database Administrator or Organizations: No    Attends Engineer, Structural: Not on file    Marital Status: Living with partner  Intimate Partner Violence: Not At Risk (09/13/2023)   Humiliation, Afraid, Rape, and Kick questionnaire    Fear of Current or Ex-Partner: No    Emotionally Abused: No    Physically Abused: No    Sexually Abused: No     Constitutional: Denies fever, malaise, fatigue, headache or abrupt weight changes.  HEENT: Denies eye pain, eye  redness, ear pain, ringing in the ears, wax buildup, runny nose, nasal congestion, bloody nose, or sore throat. Respiratory: Denies difficulty breathing, shortness of breath, cough or sputum production.   Cardiovascular: Denies chest pain, chest tightness, palpitations or swelling in the hands or feet.  Gastrointestinal: Denies abdominal pain,  bloating, constipation, diarrhea or blood in the stool.  GU: Denies urgency, frequency, pain with urination, burning sensation, blood in urine, odor or discharge. Musculoskeletal: Patient reports chronic back and knee pain.  Denies decrease in range of motion, difficulty with gait, muscle pain or joint swelling.  Skin: Denies redness, rashes, lesions or ulcercations.  Neurological: Denies dizziness, difficulty with memory, difficulty with speech or problems with balance and coordination.  Psych: Patient has a history of anxiety and depression.  Denies SI/HI.  No other specific complaints in a complete review of systems (except as listed in HPI above).  Objective:   Physical Exam BP 132/74 (BP Location: Left Arm, Patient Position: Sitting, Cuff Size: Normal)   Ht 5' 4 (1.626 m)   Wt 196 lb 6.4 oz (89.1 kg)   BMI 33.71 kg/m    Wt Readings from Last 3 Encounters:  09/21/23 184 lb 12.8 oz (83.8 kg)  09/13/23 189 lb 6 oz (85.9 kg)  08/20/23 185 lb 6.4 oz (84.1 kg)    General: Appears his stated age, obese, in NAD. Skin: Warm, dry and intact. No ulcerations noted. HEENT: Head: normal shape and size; Eyes: sclera white, no icterus, conjunctiva pink, PERRLA and EOMs intact;  Neck:  Neck supple, trachea midline. No masses, lumps or thyromegaly present.  Cardiovascular: Normal rate and rhythm. S1,S2 noted.  No murmur, rubs or gallops noted. No JVD or BLE edema. No carotid bruits noted. Varicose veins noted of BLE. Pulmonary/Chest: Normal effort and positive vesicular breath sounds. No respiratory distress. No wheezes, rales or ronchi noted.  Abdomen:  Soft and nontender. Normal bowel sounds.  Musculoskeletal: Pain with palpation over the lumbar spine. Strength 5/5 BUE/BLE. No difficulty with gait.  Neurological: Alert and oriented. Cranial nerves II-XII grossly intact. Coordination normal.  Psychiatric: Mood and affect normal. Behavior is normal. Judgment and thought content normal.    BMET    Component Value Date/Time   NA 139 09/14/2023 0348   NA 138 12/10/2016 1902   NA 140 02/03/2012 0313   K 3.6 09/14/2023 0348   K 3.5 02/03/2012 0313   CL 109 09/14/2023 0348   CL 106 02/03/2012 0313   CO2 22 09/14/2023 0348   CO2 22 02/03/2012 0313   GLUCOSE 89 09/14/2023 0348   GLUCOSE 151 (H) 02/03/2012 0313   BUN 15 09/14/2023 0348   BUN 15 12/10/2016 1902   BUN 9 02/03/2012 0313   CREATININE 0.98 09/14/2023 0348   CREATININE 1.14 08/20/2023 1510   CALCIUM  8.8 (L) 09/14/2023 0348   CALCIUM  9.2 02/03/2012 0313   GFRNONAA >60 09/14/2023 0348   GFRNONAA 66 08/17/2019 0948   GFRAA 77 08/17/2019 0948    Lipid Panel     Component Value Date/Time   CHOL 131 08/20/2023 1510   CHOL 139 12/10/2016 1902   TRIG 122 08/20/2023 1510   HDL 39 (L) 08/20/2023 1510   HDL 22 (L) 12/10/2016 1902   CHOLHDL 3.4 08/20/2023 1510   LDLCALC 72 08/20/2023 1510    CBC    Component Value Date/Time   WBC 8.8 09/14/2023 0348   RBC 5.37 09/14/2023 0348   HGB 15.1 09/14/2023 0348   HGB 14.9 04/02/2016 1939   HCT 43.8 09/14/2023 0348   HCT 43.6 04/02/2016 1939   PLT 224 09/14/2023 0348   PLT 268 04/02/2016 1939   MCV 81.6 09/14/2023 0348   MCV 86 04/02/2016 1939   MCV 87 02/03/2012 0313   MCH 28.1 09/14/2023 0348   MCHC 34.5 09/14/2023  0348   RDW 12.6 09/14/2023 0348   RDW 13.3 04/02/2016 1939   RDW 13.0 02/03/2012 0313   LYMPHSABS 3,717 08/17/2019 0948   LYMPHSABS 3.3 (H) 04/02/2016 1939   EOSABS 351 08/17/2019 0948   EOSABS 0.3 04/02/2016 1939   BASOSABS 99 08/17/2019 0948   BASOSABS 0.1 04/02/2016 1939    Hgb A1C Lab Results   Component Value Date   HGBA1C 5.7 (H) 08/20/2023            Assessment & Plan:   Preventative health maintenance:  Flu shot today Tetanus UTD Encouraged him to get his COVID-vaccine Pneumovax UTD, Prevnar 20 today Discussed Shingrix vaccine, he will check coverage with his insurance company and schedule visit he would like to have this done Colon screening UTD Encouraged him to consume a balanced diet and exercise regimen Advised him to see an eye doctor and dentist annually We will check CBC, c-Met, lipid, A1c, urine microalbumin and PSA today  RTC in 6 months, follow-up chronic conditions Angeline Laura, NP

## 2024-02-22 ENCOUNTER — Ambulatory Visit: Payer: Self-pay | Admitting: Internal Medicine

## 2024-02-22 LAB — LIPID PANEL
Cholesterol: 135 mg/dL (ref <200)
HDL: 43 mg/dL (ref >=40)
LDL Cholesterol (Calc): 72 mg/dL
Non-HDL Cholesterol (Calc): 92 mg/dL (ref <130)
Total CHOL/HDL Ratio: 3.1 (calc) (ref <5.0)
Triglycerides: 110 mg/dL (ref <150)

## 2024-02-22 LAB — CBC
HCT: 46 % (ref 38.5–50.0)
Hemoglobin: 14.7 g/dL (ref 13.2–17.1)
MCH: 28.1 pg (ref 27.0–33.0)
MCHC: 32 g/dL (ref 32.0–36.0)
MCV: 88 fL (ref 80.0–100.0)
MPV: 9.6 fL (ref 7.5–12.5)
Platelets: 321 Thousand/uL (ref 140–400)
RBC: 5.23 Million/uL (ref 4.20–5.80)
RDW: 12.9 % (ref 11.0–15.0)
WBC: 8.5 Thousand/uL (ref 3.8–10.8)

## 2024-02-22 LAB — COMPREHENSIVE METABOLIC PANEL WITH GFR
AG Ratio: 1.8 (calc) (ref 1.0–2.5)
ALT: 13 U/L (ref 9–46)
AST: 18 U/L (ref 10–35)
Albumin: 4.2 g/dL (ref 3.6–5.1)
Alkaline phosphatase (APISO): 48 U/L (ref 35–144)
BUN/Creatinine Ratio: 11 (calc) (ref 6–22)
BUN: 14 mg/dL (ref 7–25)
CO2: 24 mmol/L (ref 20–32)
Calcium: 9.1 mg/dL (ref 8.6–10.3)
Chloride: 107 mmol/L (ref 98–110)
Creat: 1.31 mg/dL — ABNORMAL HIGH (ref 0.70–1.30)
Globulin: 2.4 g/dL (ref 1.9–3.7)
Glucose, Bld: 89 mg/dL (ref 65–139)
Potassium: 4.5 mmol/L (ref 3.5–5.3)
Sodium: 137 mmol/L (ref 135–146)
Total Bilirubin: 0.3 mg/dL (ref 0.2–1.2)
Total Protein: 6.6 g/dL (ref 6.1–8.1)
eGFR: 65 mL/min/1.73m2 (ref >=60)

## 2024-02-22 LAB — HEMOGLOBIN A1C
Hgb A1c MFr Bld: 5.8 % — ABNORMAL HIGH (ref <5.7)
Mean Plasma Glucose: 120 mg/dL
eAG (mmol/L): 6.6 mmol/L

## 2024-02-22 LAB — MICROALBUMIN / CREATININE URINE RATIO
Creatinine, Urine: 219 mg/dL (ref 20–320)
Microalb Creat Ratio: 3 mg/g{creat} (ref <30)
Microalb, Ur: 0.6 mg/dL

## 2024-02-22 LAB — PSA: PSA: 0.6 ng/mL (ref <=4.00)

## 2024-02-23 ENCOUNTER — Other Ambulatory Visit
Admission: RE | Admit: 2024-02-23 | Discharge: 2024-02-23 | Disposition: A | Discharge status: Home / Self Care | Payer: Self-pay | Source: Ambulatory Visit | Attending: Medical Genetics | Admitting: Medical Genetics

## 2024-03-03 LAB — GENECONNECT MOLECULAR SCREEN: Genetic Analysis Overall Interpretation: NEGATIVE

## 2024-03-17 ENCOUNTER — Other Ambulatory Visit: Payer: Self-pay

## 2024-03-17 ENCOUNTER — Other Ambulatory Visit: Payer: Self-pay | Admitting: Internal Medicine

## 2024-03-17 DIAGNOSIS — I10 Essential (primary) hypertension: Secondary | ICD-10-CM

## 2024-03-17 DIAGNOSIS — E7849 Other hyperlipidemia: Secondary | ICD-10-CM

## 2024-03-18 ENCOUNTER — Other Ambulatory Visit: Payer: Self-pay

## 2024-03-20 ENCOUNTER — Other Ambulatory Visit: Payer: Self-pay

## 2024-03-20 ENCOUNTER — Other Ambulatory Visit: Payer: Self-pay | Admitting: Internal Medicine

## 2024-03-20 DIAGNOSIS — I10 Essential (primary) hypertension: Secondary | ICD-10-CM

## 2024-03-20 DIAGNOSIS — E7849 Other hyperlipidemia: Secondary | ICD-10-CM

## 2024-03-23 ENCOUNTER — Other Ambulatory Visit: Payer: Self-pay

## 2024-03-23 MED FILL — Fenofibrate Tab 145 MG: ORAL | 30 days supply | Qty: 30 | Fill #0 | Status: AC

## 2024-03-23 MED FILL — Isosorbide Mononitrate Tab ER 24HR 120 MG: ORAL | 90 days supply | Qty: 90 | Fill #0 | Status: AC

## 2024-03-23 MED FILL — Fenofibrate Tab 145 MG: ORAL | 60 days supply | Qty: 60 | Fill #0 | Status: AC

## 2024-03-23 MED FILL — Benazepril HCl Tab 40 MG: ORAL | 90 days supply | Qty: 90 | Fill #0 | Status: AC

## 2024-03-23 NOTE — Telephone Encounter (Signed)
 Requested Prescriptions  Pending Prescriptions Disp Refills   benazepril  (LOTENSIN ) 40 MG tablet 90 tablet 1    Sig: Take 1 tablet (40 mg total) by mouth daily.     Cardiovascular:  ACE Inhibitors Failed - 03/23/2024 11:35 AM      Failed - Cr in normal range and within 180 days    Creat  Date Value Ref Range Status  02/21/2024 1.31 (H) 0.70 - 1.30 mg/dL Final   Creatinine, Urine  Date Value Ref Range Status  02/21/2024 219 20 - 320 mg/dL Final         Passed - K in normal range and within 180 days    Potassium  Date Value Ref Range Status  02/21/2024 4.5 3.5 - 5.3 mmol/L Final  02/03/2012 3.5 3.5 - 5.1 mmol/L Final         Passed - Patient is not pregnant      Passed - Last BP in normal range    BP Readings from Last 1 Encounters:  02/21/24 132/74         Passed - Valid encounter within last 6 months    Recent Outpatient Visits           1 month ago Encounter for general adult medical examination with abnormal findings   [place] [facility] [place], [place] W, NP   6 months ago ST elevation myocardial infarction (STEMI), unspecified artery [facility])   [place] [facility] [place], [place] W, NP   7 months ago Diabetes mellitus without complication [facility])    [place] [place], [place], NP               fenofibrate  (TRICOR ) 145 MG tablet 90 tablet 1    Sig: Take 1 tablet (145 mg total) by mouth daily.     Cardiovascular:  Antilipid - Fibric Acid Derivatives Failed - 03/23/2024 11:35 AM      Failed - Cr in normal range and within 360 days    Creat  Date Value Ref Range Status  02/21/2024 1.31 (H) 0.70 - 1.30 mg/dL Final   Creatinine, Urine  Date Value Ref Range Status  02/21/2024 219 20 - 320 mg/dL Final         Failed - Lipid Panel in normal range within the last 12 months    Cholesterol, Total  Date Value Ref Range Status  12/10/2016 139 100 - 199 mg/dL Final   Cholesterol  Date Value Ref Range  Status  02/21/2024 135 <200 mg/dL Final   LDL Cholesterol (Calc)  Date Value Ref Range Status  02/21/2024 72 mg/dL (calc) Final    Comment:    Reference range: <100 . Desirable range <100 mg/dL for primary prevention;   <70 mg/dL for patients with CHD or diabetic patients  with > or = 2 CHD risk factors. SABRA LDL-C is now calculated using the Martin-Hopkins  calculation, which is a validated novel method providing  better accuracy than the Friedewald equation in the  estimation of LDL-C.  Gladis APPLETHWAITE et al. SANDREA. 7986;689(80): 2061-2068  (http://education.[employer].com/faq/FAQ164)    HDL  Date Value Ref Range Status  02/21/2024 43 > OR = 40 mg/dL Final  91/76/7981 22 (L) >39 mg/dL Final   Triglycerides  Date Value Ref Range Status  02/21/2024 110 <150 mg/dL Final         Passed - ALT in normal range and within 360 days    ALT  Date Value Ref  Range Status  02/21/2024 13 9 - 46 U/L Final   SGPT (ALT)  Date Value Ref Range Status  02/03/2012 35 12 - 78 U/L Final         Passed - AST in normal range and within 360 days    AST  Date Value Ref Range Status  02/21/2024 18 10 - 35 U/L Final   SGOT(AST)  Date Value Ref Range Status  02/03/2012 35 15 - 37 Unit/L Final         Passed - HGB in normal range and within 360 days    Hemoglobin  Date Value Ref Range Status  02/21/2024 14.7 13.2 - 17.1 g/dL Final  87/85/7982 85.0 13.0 - 17.7 g/dL Final         Passed - HCT in normal range and within 360 days    HCT  Date Value Ref Range Status  02/21/2024 46.0 38.5 - 50.0 % Final   Hematocrit  Date Value Ref Range Status  04/02/2016 43.6 37.5 - 51.0 % Final         Passed - PLT in normal range and within 360 days    Platelets  Date Value Ref Range Status  02/21/2024 321 140 - 400 Thousand/uL Final  04/02/2016 268 150 - 379 x10E3/uL Final         Passed - WBC in normal range and within 360 days    WBC  Date Value Ref Range Status  02/21/2024 8.5 3.8 -  10.8 Thousand/uL Final         Passed - eGFR is 30 or above and within 360 days    GFR, Est African American  Date Value Ref Range Status  08/17/2019 77 > OR = 60 mL/min/1.55m2 Final   GFR, Est Non African American  Date Value Ref Range Status  08/17/2019 66 > OR = 60 mL/min/1.59m2 Final   GFR, Estimated  Date Value Ref Range Status  09/14/2023 >60 >60 mL/min Final    Comment:    (NOTE) Calculated using the CKD-EPI Creatinine Equation (2021)    eGFR  Date Value Ref Range Status  02/21/2024 65 > OR = 60 mL/min/1.4m2 Final         Passed - Valid encounter within last 12 months    Recent Outpatient Visits           1 month ago Encounter for general adult medical examination with abnormal findings   [place] [facility] [place], [place] W, NP   6 months ago ST elevation myocardial infarction (STEMI), unspecified artery [facility])   [place] [facility] [place], [place] W, NP   7 months ago Diabetes mellitus without complication [facility])    [facility] [place], [place], NP               isosorbide  mononitrate (IMDUR ) 120 MG 24 hr tablet 90 tablet 1    Sig: Take 1 tablet (120 mg total) by mouth daily.     Cardiovascular:  Nitrates Passed - 03/23/2024 11:35 AM      Passed - Last BP in normal range    BP Readings from Last 1 Encounters:  02/21/24 132/74         Passed - Last Heart Rate in normal range    Pulse Readings from Last 1 Encounters:  09/21/23 (!) 54         Passed - Valid encounter within last 12 months    Recent Outpatient Visits  1 month ago Encounter for general adult medical examination with abnormal findings   [place] [facility] [place], [place] W, NP   6 months ago ST elevation myocardial infarction (STEMI), unspecified artery [place])   [place] [facility] [place], Angeline ORN, NP   7 months ago Diabetes mellitus without complication [facility] [place])   [place]  [facility] [place], Angeline ORN, [place]

## 2024-03-27 ENCOUNTER — Other Ambulatory Visit: Payer: Self-pay

## 2024-04-10 ENCOUNTER — Other Ambulatory Visit: Payer: Self-pay | Admitting: Internal Medicine

## 2024-04-10 DIAGNOSIS — I10 Essential (primary) hypertension: Secondary | ICD-10-CM

## 2024-04-10 DIAGNOSIS — K219 Gastro-esophageal reflux disease without esophagitis: Secondary | ICD-10-CM

## 2024-04-11 ENCOUNTER — Other Ambulatory Visit: Payer: Self-pay

## 2024-04-11 ENCOUNTER — Other Ambulatory Visit: Payer: Self-pay | Admitting: Internal Medicine

## 2024-04-11 DIAGNOSIS — I10 Essential (primary) hypertension: Secondary | ICD-10-CM

## 2024-04-12 ENCOUNTER — Other Ambulatory Visit: Payer: Self-pay

## 2024-04-12 MED ORDER — PANTOPRAZOLE SODIUM 20 MG PO TBEC
20.0000 mg | DELAYED_RELEASE_TABLET | Freq: Every day | ORAL | 1 refills | Status: AC
  Filled 2024-04-12: qty 90, 90d supply, fill #0

## 2024-04-12 MED FILL — Bupropion HCl Tab ER 24HR 150 MG: ORAL | 90 days supply | Qty: 90 | Fill #0 | Status: AC

## 2024-04-12 MED FILL — Amlodipine Besylate Tab 10 MG (Base Equivalent): ORAL | 90 days supply | Qty: 90 | Fill #0 | Status: AC

## 2024-04-12 MED FILL — Fluoxetine HCl Cap 40 MG: ORAL | 90 days supply | Qty: 90 | Fill #0 | Status: AC

## 2024-04-12 NOTE — Telephone Encounter (Signed)
 Requested Prescriptions  Pending Prescriptions Disp Refills   amLODipine  (NORVASC ) 10 MG tablet 90 tablet 1    Sig: Take 1 tablet (10 mg total) by mouth daily.     Cardiovascular: Calcium  Channel Blockers 2 Passed - 04/12/2024  4:17 PM      Passed - Last BP in normal range    BP Readings from Last 1 Encounters:  02/21/24 132/74         Passed - Last Heart Rate in normal range    Pulse Readings from Last 1 Encounters:  09/21/23 (!) 54         Passed - Valid encounter within last 6 months    Recent Outpatient Visits           1 month ago Encounter for general adult medical examination with abnormal findings   [place] [facility] [place], [place] W, NP   6 months ago ST elevation myocardial infarction (STEMI), unspecified artery [facility])   [place] [facility] [place], [place] W, NP   7 months ago Diabetes mellitus without complication [facility])   [place] [facility] [place], Angeline ORN, NP               buPROPion  (WELLBUTRIN  XL) 150 MG 24 hr tablet 90 tablet 1    Sig: Take 1 tablet (150 mg total) by mouth daily.     Psychiatry: Antidepressants - bupropion  Failed - 04/12/2024  4:17 PM      Failed - Cr in normal range and within 360 days    Creat  Date Value Ref Range Status  02/21/2024 1.31 (H) 0.70 - 1.30 mg/dL Final   Creatinine, Urine  Date Value Ref Range Status  02/21/2024 219 20 - 320 mg/dL Final         Passed - AST in normal range and within 360 days    AST  Date Value Ref Range Status  02/21/2024 18 10 - 35 U/L Final   SGOT(AST)  Date Value Ref Range Status  02/03/2012 35 15 - 37 Unit/L Final         Passed - ALT in normal range and within 360 days    ALT  Date Value Ref Range Status  02/21/2024 13 9 - 46 U/L Final   SGPT (ALT)  Date Value Ref Range Status  02/03/2012 35 12 - 78 U/L Final         Passed - Completed PHQ-2 or PHQ-9 in the last 360 days      Passed - Last BP in normal range    BP  Readings from Last 1 Encounters:  02/21/24 132/74         Passed - Valid encounter within last 6 months    Recent Outpatient Visits           1 month ago Encounter for general adult medical examination with abnormal findings   [place] [facility] [place], [place], NP   6 months ago ST elevation myocardial infarction (STEMI), unspecified artery [facility])   [facility] [place], [place], NP   7 months ago Diabetes mellitus without complication [facility])   [place] [facility] [place], [place], NP               FLUoxetine  (PROZAC ) 40 MG capsule 90 capsule 1    Sig: Take 1 capsule (40 mg total) by mouth daily.     Psychiatry:  Antidepressants - SSRI Passed - 04/12/2024  4:17 PM      Passed - Completed PHQ-2 or PHQ-9 in the last 360 days      Passed - Valid encounter within last 6 months    Recent Outpatient Visits           1 month ago Encounter for general adult medical examination with abnormal findings   [place] [facility] [place], [place], NP   6 months ago ST elevation myocardial infarction (STEMI), unspecified artery [facility])   [place] [facility] [place], Angeline ORN, NP   7 months ago Diabetes mellitus without complication [facility])    [facility] [place], Angeline ORN, [place]

## 2024-04-12 NOTE — Telephone Encounter (Signed)
 Requested Prescriptions  Pending Prescriptions Disp Refills   pantoprazole  (PROTONIX ) 20 MG tablet 90 tablet 1    Sig: Take 1 tablet (20 mg total) by mouth daily.     Gastroenterology: Proton Pump Inhibitors Passed - 04/12/2024  2:06 PM      Passed - Valid encounter within last 12 months    Recent Outpatient Visits           1 month ago Encounter for general adult medical examination with abnormal findings   [place] [facility] [place], [place], NP   6 months ago ST elevation myocardial infarction (STEMI), unspecified artery [facility])   [place] [facility] [place], Angeline ORN, NP   7 months ago Diabetes mellitus without complication [facility])   [place] [facility] [place], Angeline ORN, [place]

## 2024-04-14 ENCOUNTER — Other Ambulatory Visit: Payer: Self-pay

## 2024-04-25 ENCOUNTER — Other Ambulatory Visit: Payer: Self-pay

## 2024-05-18 ENCOUNTER — Other Ambulatory Visit: Payer: Self-pay | Admitting: Obstetrics and Gynecology

## 2024-05-18 ENCOUNTER — Other Ambulatory Visit: Payer: Self-pay | Admitting: Internal Medicine

## 2024-05-18 ENCOUNTER — Other Ambulatory Visit: Payer: Self-pay

## 2024-05-18 DIAGNOSIS — I252 Old myocardial infarction: Secondary | ICD-10-CM

## 2024-05-18 DIAGNOSIS — I10 Essential (primary) hypertension: Secondary | ICD-10-CM

## 2024-05-19 ENCOUNTER — Other Ambulatory Visit: Payer: Self-pay

## 2024-05-19 MED ORDER — CARVEDILOL 25 MG PO TABS
25.0000 mg | ORAL_TABLET | Freq: Two times a day (BID) | ORAL | 1 refills | Status: AC
  Filled 2024-05-19: qty 180, 90d supply, fill #0

## 2024-05-19 NOTE — Telephone Encounter (Signed)
 Requested Prescriptions  Pending Prescriptions Disp Refills   carvedilol  (COREG ) 25 MG tablet 180 tablet 1    Sig: Take 1 tablet (25 mg total) by mouth 2 (two) times daily with a meal.     Cardiovascular: Beta Blockers 3 Failed - 05/19/2024 10:20 AM      Failed - Cr in normal range and within 360 days    Creat  Date Value Ref Range Status  02/21/2024 1.31 (H) 0.70 - 1.30 mg/dL Final   Creatinine, Urine  Date Value Ref Range Status  02/21/2024 219 20 - 320 mg/dL Final         Passed - AST in normal range and within 360 days    AST  Date Value Ref Range Status  02/21/2024 18 10 - 35 U/L Final   SGOT(AST)  Date Value Ref Range Status  02/03/2012 35 15 - 37 Unit/L Final         Passed - ALT in normal range and within 360 days    ALT  Date Value Ref Range Status  02/21/2024 13 9 - 46 U/L Final   SGPT (ALT)  Date Value Ref Range Status  02/03/2012 35 12 - 78 U/L Final         Passed - Last BP in normal range    BP Readings from Last 1 Encounters:  02/21/24 132/74         Passed - Last Heart Rate in normal range    Pulse Readings from Last 1 Encounters:  09/21/23 (!) 54         Passed - Valid encounter within last 6 months    Recent Outpatient Visits           2 months ago Encounter for general adult medical examination with abnormal findings   [place] [facility] [place], [place], NP   8 months ago ST elevation myocardial infarction (STEMI), unspecified artery [facility])   [place] [facility] [place], Angeline ORN, NP   9 months ago Diabetes mellitus without complication [facility])    [facility] [place], Angeline ORN, [place]

## 2024-05-24 ENCOUNTER — Other Ambulatory Visit: Payer: Self-pay

## 2024-05-24 ENCOUNTER — Other Ambulatory Visit: Payer: Self-pay | Admitting: Internal Medicine

## 2024-05-25 ENCOUNTER — Other Ambulatory Visit: Payer: Self-pay

## 2024-05-25 MED FILL — Atorvastatin Calcium Tab 80 MG (Base Equivalent): ORAL | 90 days supply | Qty: 90 | Fill #0 | Status: AC

## 2024-05-25 NOTE — Telephone Encounter (Signed)
 Requested medication (s) are due for refill today: yes  Requested medication (s) are on the active medication list: yes  Last refill:  09/14/23  Future visit scheduled: {Yes  Notes to clinic:  Unable to refill per protocol, last refill by another/ED provider. Routing to PCP for approval.     Requested Prescriptions  Pending Prescriptions Disp Refills   atorvastatin  (LIPITOR ) 80 MG tablet 90 tablet 1    Sig: Take 1 tablet (80 mg total) by mouth daily.     Cardiovascular:  Antilipid - Statins Failed - 05/25/2024  2:58 PM      Failed - Lipid Panel in normal range within the last 12 months    Cholesterol, Total  Date Value Ref Range Status  12/10/2016 139 100 - 199 mg/dL Final   Cholesterol  Date Value Ref Range Status  02/21/2024 135 <200 mg/dL Final   LDL Cholesterol (Calc)  Date Value Ref Range Status  02/21/2024 72 mg/dL (calc) Final    Comment:    Reference range: <100 . Desirable range <100 mg/dL for primary prevention;   <70 mg/dL for patients with CHD or diabetic patients  with > or = 2 CHD risk factors. SABRA LDL-C is now calculated using the Martin-Hopkins  calculation, which is a validated novel method providing  better accuracy than the Friedewald equation in the  estimation of LDL-C.  Gladis APPLETHWAITE et al. SANDREA. 7986;689(80): 2061-2068  (http://education.[facility].com/faq/FAQ164)    HDL  Date Value Ref Range Status  02/21/2024 43 > OR = 40 mg/dL Final  91/76/7981 22 (L) >39 mg/dL Final   Triglycerides  Date Value Ref Range Status  02/21/2024 110 <150 mg/dL Final         Passed - Patient is not pregnant      Passed - Valid encounter within last 12 months    Recent Outpatient Visits           3 months ago Encounter for general adult medical examination with abnormal findings   [place] [facility] [place], [place], NP   8 months ago ST elevation myocardial infarction (STEMI), unspecified artery [facility])   [place] [facility] [place], [place] ORN, NP   9 months ago Diabetes mellitus without complication [facility])   [place] [facility] [place], [place] ORN, [place]

## 2024-05-31 ENCOUNTER — Ambulatory Visit

## 2024-08-22 ENCOUNTER — Ambulatory Visit: Admitting: Internal Medicine
